# Patient Record
Sex: Male | Born: 1965 | Race: White | Hispanic: No | Marital: Married | Smoking: Never smoker
Health system: Southern US, Community
[De-identification: ages and names within clinical notes are randomized; demographics above are authoritative.]

## PROBLEM LIST (undated history)

## (undated) DIAGNOSIS — K5790 Diverticulosis of intestine, part unspecified, without perforation or abscess without bleeding: Secondary | ICD-10-CM

## (undated) DIAGNOSIS — K648 Other hemorrhoids: Secondary | ICD-10-CM

## (undated) DIAGNOSIS — I10 Essential (primary) hypertension: Secondary | ICD-10-CM

## (undated) DIAGNOSIS — Z98811 Dental restoration status: Secondary | ICD-10-CM

## (undated) DIAGNOSIS — E785 Hyperlipidemia, unspecified: Secondary | ICD-10-CM

## (undated) DIAGNOSIS — E119 Type 2 diabetes mellitus without complications: Secondary | ICD-10-CM

## (undated) DIAGNOSIS — G8929 Other chronic pain: Secondary | ICD-10-CM

## (undated) DIAGNOSIS — M722 Plantar fascial fibromatosis: Secondary | ICD-10-CM

## (undated) HISTORY — DX: Essential (primary) hypertension: I10

## (undated) HISTORY — DX: Other hemorrhoids: K64.8

## (undated) HISTORY — DX: Type 2 diabetes mellitus without complications: E11.9

## (undated) HISTORY — DX: Plantar fascial fibromatosis: M72.2

## (undated) HISTORY — PX: COLONOSCOPY: SHX174

## (undated) HISTORY — DX: Diverticulosis of intestine, part unspecified, without perforation or abscess without bleeding: K57.90

## (undated) HISTORY — DX: Hyperlipidemia, unspecified: E78.5

## (undated) HISTORY — PX: SHOULDER ARTHROSCOPY: SHX128

---

## 2001-07-19 ENCOUNTER — Encounter: Admission: RE | Admit: 2001-07-19 | Discharge: 2001-08-09 | Payer: Self-pay | Admitting: Specialist

## 2003-05-05 ENCOUNTER — Encounter: Admission: RE | Admit: 2003-05-05 | Discharge: 2003-05-05 | Payer: Self-pay | Admitting: General Surgery

## 2006-04-04 HISTORY — PX: OTHER SURGICAL HISTORY: SHX169

## 2007-01-09 ENCOUNTER — Ambulatory Visit (HOSPITAL_BASED_OUTPATIENT_CLINIC_OR_DEPARTMENT_OTHER): Admission: RE | Admit: 2007-01-09 | Discharge: 2007-01-09 | Payer: Self-pay | Admitting: Orthopedic Surgery

## 2007-04-05 HISTORY — PX: GANGLION CYST EXCISION: SHX1691

## 2007-10-26 ENCOUNTER — Encounter (INDEPENDENT_AMBULATORY_CARE_PROVIDER_SITE_OTHER): Payer: Self-pay | Admitting: Orthopedic Surgery

## 2007-10-26 ENCOUNTER — Ambulatory Visit (HOSPITAL_BASED_OUTPATIENT_CLINIC_OR_DEPARTMENT_OTHER): Admission: RE | Admit: 2007-10-26 | Discharge: 2007-10-26 | Payer: Self-pay | Admitting: Orthopedic Surgery

## 2008-01-22 ENCOUNTER — Ambulatory Visit: Payer: Self-pay | Admitting: Gastroenterology

## 2008-01-22 DIAGNOSIS — R195 Other fecal abnormalities: Secondary | ICD-10-CM

## 2008-01-22 DIAGNOSIS — K625 Hemorrhage of anus and rectum: Secondary | ICD-10-CM

## 2008-01-24 LAB — CONVERTED CEMR LAB
BUN: 13 mg/dL (ref 6–23)
Basophils Relative: 1.8 % (ref 0.0–3.0)
CO2: 32 meq/L (ref 19–32)
Calcium: 9.5 mg/dL (ref 8.4–10.5)
Chloride: 100 meq/L (ref 96–112)
Creatinine, Ser: 1 mg/dL (ref 0.4–1.5)
Eosinophils Absolute: 0.5 10*3/uL (ref 0.0–0.7)
Eosinophils Relative: 5.8 % — ABNORMAL HIGH (ref 0.0–5.0)
GFR calc Af Amer: 105 mL/min
GFR calc non Af Amer: 87 mL/min
Hemoglobin: 15.1 g/dL (ref 13.0–17.0)
Lymphocytes Relative: 37.2 % (ref 12.0–46.0)
MCV: 86.1 fL (ref 78.0–100.0)
Neutro Abs: 3.6 10*3/uL (ref 1.4–7.7)
Neutrophils Relative %: 44.1 % (ref 43.0–77.0)
RBC: 4.96 M/uL (ref 4.22–5.81)
Total Bilirubin: 0.8 mg/dL (ref 0.3–1.2)
WBC: 8.1 10*3/uL (ref 4.5–10.5)
aPTT: 32.6 s — ABNORMAL HIGH (ref 21.7–29.8)

## 2008-02-05 ENCOUNTER — Ambulatory Visit: Payer: Self-pay | Admitting: Gastroenterology

## 2008-04-04 HISTORY — PX: OTHER SURGICAL HISTORY: SHX169

## 2009-02-12 ENCOUNTER — Telehealth: Payer: Self-pay | Admitting: Gastroenterology

## 2009-02-20 ENCOUNTER — Ambulatory Visit (HOSPITAL_BASED_OUTPATIENT_CLINIC_OR_DEPARTMENT_OTHER): Admission: RE | Admit: 2009-02-20 | Discharge: 2009-02-21 | Payer: Self-pay | Admitting: Orthopedic Surgery

## 2010-05-12 ENCOUNTER — Other Ambulatory Visit: Payer: Self-pay | Admitting: Family Medicine

## 2010-05-14 ENCOUNTER — Other Ambulatory Visit: Payer: Self-pay

## 2010-05-19 ENCOUNTER — Other Ambulatory Visit: Payer: Self-pay

## 2010-05-27 ENCOUNTER — Ambulatory Visit
Admission: RE | Admit: 2010-05-27 | Discharge: 2010-05-27 | Disposition: A | Discharge status: Home / Self Care | Payer: 59 | Source: Ambulatory Visit | Attending: Family Medicine | Admitting: Family Medicine

## 2010-07-07 LAB — POCT HEMOGLOBIN-HEMACUE: Hemoglobin: 15 g/dL (ref 13.0–17.0)

## 2010-08-06 ENCOUNTER — Other Ambulatory Visit: Payer: Self-pay | Admitting: Dermatology

## 2010-08-17 NOTE — Op Note (Signed)
NAMECARTIER, WASHKO                  ACCOUNT NO.:  [id number]   MEDICAL RECORD NO.:  [id number]          PATIENT TYPE:  AMB   LOCATION:  DSC                          FACILITY:  [facility]   PHYSICIAN:  Katy Fitch. Sypher, M.D. DATE OF BIRTH:  Dec 11, 1965   DATE OF PROCEDURE:  01/09/2007  DATE OF DISCHARGE:                               OPERATIVE REPORT   PREOPERATIVE DIAGNOSIS:  MRI proven partial rupture of right distal  biceps insertion at radial tuberosity.   POSTOPERATIVE DIAGNOSIS:  MRI proven partial rupture of right distal  biceps insertion at radial tuberosity with identification of a 95%  distal biceps rupture with extensive pseudotendon formation at bicipital  tuberosity.   OPERATION:  1. Completion of biceps rupture.  2. Reconstruction of distal biceps insertion with an 8.5 mm drill hole      in the radial tuberosity anatomically placed with the aid of a C-      arm fluoroscope and securing of the biceps with an Arthrex      retrobutton system.   SURGEON:  Katy Fitch. Sypher, M.D.   ASSISTANT:  Marveen Reeks. Dasnoit, P.A.-C.   ANESTHESIA:  General by LMA.   SUPERVISING ANESTHESIOLOGIST:  Janetta Hora. Gelene Mink, M.D.   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  He has a history of radial tunnel syndrome corrected in  1999 by decompression of the posterior interosseous nerve at the arcade  of [place].  He subsequently returned for evaluation in the spring of  2008 with a several month history of positional severe anterior elbow  pain in the right dominant arm.  Mr. Langille is quite muscular and is very  physically active.  We advised a period of three months rest; however,  he was never able to achieve any satisfactory relief.  Therefore, an MRI  was obtained documenting a partial biceps insertion rupture with  evidence of pseudotendon and no obvious retraction of the tendon.  Due  to a  failure to respond to nonoperative measures, he is returned to the  operating room at this time.   Prior to proceeding with exploration of his antecubital fossa.  We, once  again, meticulously examined his arm in the holding area.  He was noted  to have marked pain with resistance to supination most notable with his  elbow at 90 degrees flexion, less so at 45 degrees of flexion, and  nearly absent in full extension.  He had significant pain with firm  palpation against the biceps tendon while flexing the elbow.  He had no  discomfort at the radial tunnel, no discomfort at the lateral or medial  epicondyles.  After informed consent, we proceed to surgery at this  time.   PROCEDURE:  Iram Lundberg is brought to the operating room and placed  in the supine position on the operating table.  Following an examination  by Dr. Gelene Mink for preoperative anesthesia consult, general anesthesia  by LMA technique was recommended.  He is brought to room  6 and placed in  the supine position on the operating table, and under Dr. Thornton Dales  direct supervision, general endotracheal anesthesia was induced.  A  pneumatic tourniquet was applied to the very proximal right brachium.  The arm was prepped with Betadine soap solution and sterilely draped.  Following exsanguination of the limb with an Esmarch bandage, the  arterial tourniquet was inflated to 250 mmHg due to his muscular arm.   The procedure commenced with a 4 cm transverse incision just distal to  the elbow flexion crease.  The subcutaneous tissues were carefully  divided taking care to meticulously dissect and identify the lateral  antebrachial cutaneous nerve.  This was gently retracted with blunt  retractors.  The antecubital fascia was carefully dissected and a  communicating branch between the cephalic and basilic veins was suture  ligated and transected.  The biceps was followed to its anatomic  insertion on the radial tuberosity.   There was the hyalinized  pseudotendon and a 95% avulsion of the biceps at its insertion.  Only  the most ulnar and posterior fibers remained.   After careful retraction of the adjacent soft tissue structures  including the median nerve and interosseous vessels, the tendon rupture  was completed with an osteotome and tenotomy scissors followed by  trimming of the tendon and preparation with placement of a [place] suture  of #2 FiberWire.  The retrobutton system was threaded in the usual  manner to essentially act as a block in tackle.  A beef needle was  placed through the radial tuberosity after checking for anatomic  position of the tuberosity and biceps insertion.  This was drilled  unicortically followed by over drilling with an 8.5 mm Arthrex reamer.  We subsequently penetrated the posterior cortex with the appropriate  size drill and, after irrigation and removal of all bone scrap, passed  the retrobutton with standard kite string technique.  With the aid of  the C-arm fluoroscope, the retrobutton was positioned on the dorsal  cortex of the radius followed by tensioning of the biceps tendon with  the elbow in flexion.  The tendon nested well within the intermaxillary  canal of the radius and the C-arm fluoroscope was used to confirm  satisfactory position of the retrobutton.  After looping the sutures  through the tendon one more time, the sutures were tied for a very  satisfactory fixation.   The wound was meticulously irrigated with sterile saline followed by  repair of the skin with subdermal sutures of 4-0 Vicryl and intradermal  3-0 Prolene with Steri-Strips.  Preoperatively, Mr. Yarbrough had an  infraclavicular block placed as a postoperative analgesic.  He was  placed in a compressive dressing with medial and lateral splints and was  awakened from general anesthesia and transferred to the recovery room  with stable vital signs.   He will be discharged home to the care of his  family.  We will see him  back for follow up in approximately four days to remove the dressing and  proceed to a lateral long arm splint maintaining his forearm in the  neutral position and his elbow at 90 degrees of flexion.  He will be  allowed to begin active range of motion exercises and elbow flexion  extension, but will not be allowed to pronate or supinator for six  weeks.  There were no apparent complications.      Katy Fitch Sypher, M.D.  Electronically Signed     RVS/MEDQ  D:  01/09/2007  T:  01/09/2007  Job:  [id number]

## 2010-08-17 NOTE — Op Note (Signed)
[name], [name]                  ACCOUNT NO.:  [id number]   MEDICAL RECORD NO.:  [id number]          PATIENT TYPE:  AMB   LOCATION:  DSC                          FACILITY:  [facility]   PHYSICIAN:  [name] Fitch. [name], M.D. 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   OPERATING SURGEON:  [name] Fitch. Sypher, MD   ASSISTANT:  Marveen Reeks Dasnoit, PA-C   ANESTHESIA:  General by LMA.   SUPERVISING ANESTHESIOLOGIST:  [name] Mayo, MD   INDICATIONS:  [name] [name] is a well-known patient who presented for  evaluation of a perplexing right wrist pain predicament.  [name] [name] is very  athletic and enjoys [employer]  He was back in the gym training  himself and noted pain with load bearing on his right hand with his  wrist in the dorsiflexed position such as doing a pushup or bench press.  Clinical examination suggested either a scapholunate interosseous  ligament injury or a possible subretinacular dorsal ganglion.   Due to his inability to improve with rest, he was referred for an MRI of  his right wrist on June 19, 2007.  This was accomplished at [facility] and interpreted by [name] [name], attending radiologist.   Dr. Suzie [name] identified a 5-mm circumscribed T2 hyperintense cyst between  the dorsal band of the scapholunate ligament and the dorsal wrist  capsule.  There was also a small hyperintense cyst on the volar margin  of the  distal radius.  These can be lymphatic cysts on the volar aspect  of the wrist and are rarely symptomatic.   [name] [name] was observed for 4 months and was unable to have relief of his  symptoms while weight training and load bearing on his wrist.  We  advised him to proceed with exploration of the dorsal aspect of the  wrist anticipating removal of a capsular ganglion and decompression of  the posterior interosseous nerve.   Preoperatively, we reminded [name] [name] and his family members that there  is no guarantee that a myxoid cyst or ganglion will not recur; however,  with proper debridement of the capsule in the dorsal aspect of the  wrist, recurrence rates are usually 5% or less.   For any 1 individual, the recurrence is either 100% or 0.   Preoperatively, questions were invited and answered in detail.  After  informed consent, he was brought to the operating room at this time.   PROCEDURE:  [name] [name] was brought to the operating room and placed  in supine position upon the operating table.   Following an anesthesia consult with Dr.  Fitzgerald, general anesthesia  by LMA was recommended and accepted by [name] [name].   He was brought to room #1, placed in supine position on the operating  table, and under Dr. Jarrett Ables direct supervision, general anesthesia  by LMA technique induced.   His right upper extremity was prepped with Betadine soap solution and  sterilely draped.  A pneumatic tourniquet was applied to the proximal  right brachium.   Following exsanguination of the right arm with Esmarch bandage, arterial  tourniquet was inflated to 220 mmHg.   Procedure commenced with identification of landmarks in the dorsal  aspect of the wrist.  Lister's tubercle, the proximal pole of the  scaphoid and dorsal tubercle, lunate were all identified.  A transverse  incision was planned directly over the scapholunate ligament.  Subcutaneous tissues were carefully divided taking care  to identify the  extensor retinaculum and retract the dorsal vascular structures.  The  retinaculum was split in the line of its fibers followed by gentle  retraction of the radial wrist extensors in a radial direction and the  finger extensions in an ulnar direction.  The capsule of the wrist joint  was palpated and a mass was identified directly over the scapholunate  ligament.  Taking care to preserve the radiocarpal ligaments, a  triangular portion of the dorsal capsule was resected directly over the  scapholunate ligament.  There was noted to be myxoid degeneration of the  capsule and a cyst that extended into the dorsal limb of the  scapholunate ligament.  The ligament had an yellowish discoloration and  some fibrillar degenerative change.  The ligament was carefully palpated  with a microspatula-type elevator in an effort to tease the fibers apart  and try to express any mucin within the ligament fibers.   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  There was moderate degree of synovitis noted dorsally  that was removed with a small rongeur.  The lunate was visualized and  found to have intact hyaline cartilage on its dorsal and central  articular surface.  The scaphoid facet of the radius and lunate facet of  the radius appeared normal.   With great care, the tendons of the fourth dorsal compartment were  retracted after release of the fourth dorsal compartment proximally over  1 cm.  The posterior interosseous nerve was identified, and a 1-cm  segment of the nerve was resected.   The electrocautery was used to control capillary bleeding on the margins  of the capsular section followed by anatomic reconstruction of the  retinaculum with figure-of-eight sutures of 4-0 Vicryl.  The skin was  repaired with subcutaneous sutures of 4-0 Vicryl and intradermal 3-0  Prolene.   There were no apparent complications.   Under direct  vision, the scapholunate ligament was tested.  There was  mild laxity but no frank instability.  I would grade the laxity of the  ligament at grade 1.   There were otherwise no pathologies noted.  The wound was dressed with a  Steri-Strip, sterile gauze, and a volar plaster splint.  Lidocaine 2%  was infiltrated for postop analgesia.  [name] [name] will be discharged with  a prescription for Percocet 5 mg 1 p.o. q.4-6 h. p.r.n. pain, 20 tablets  without refill.   We will see him back for followup in our office in 1 week or sooner  p.r.n. problems.      [name] [name], M.D.  Electronically  Signed     RVS/MEDQ  D:  10/26/2007  T:  10/27/2007  Job:  [id number]   cc:   Ernestina Penna, MD

## 2012-07-03 ENCOUNTER — Other Ambulatory Visit (INDEPENDENT_AMBULATORY_CARE_PROVIDER_SITE_OTHER): Payer: 59

## 2012-07-03 DIAGNOSIS — Z1212 Encounter for screening for malignant neoplasm of rectum: Secondary | ICD-10-CM

## 2012-08-10 ENCOUNTER — Telehealth: Payer: Self-pay | Admitting: Nurse Practitioner

## 2012-08-10 ENCOUNTER — Ambulatory Visit (INDEPENDENT_AMBULATORY_CARE_PROVIDER_SITE_OTHER): Payer: 59 | Admitting: General Practice

## 2012-08-10 ENCOUNTER — Encounter: Payer: Self-pay | Admitting: General Practice

## 2012-08-10 VITALS — BP 160/91 | HR 66 | Temp 97.3°F | Ht 70.0 in | Wt 202.0 lb

## 2012-08-10 DIAGNOSIS — L259 Unspecified contact dermatitis, unspecified cause: Secondary | ICD-10-CM

## 2012-08-10 DIAGNOSIS — L237 Allergic contact dermatitis due to plants, except food: Secondary | ICD-10-CM

## 2012-08-10 DIAGNOSIS — L255 Unspecified contact dermatitis due to plants, except food: Secondary | ICD-10-CM

## 2012-08-10 MED ORDER — PREDNISONE (PAK) 10 MG PO TABS: ORAL_TABLET | ORAL | Status: DC

## 2012-08-10 NOTE — Patient Instructions (Addendum)
Poison Ivy  Poison ivy is a inflammation of the skin (contact dermatitis) caused by touching the allergens on the leaves of the ivy plant following previous exposure to the plant. The rash usually appears 48 hours after exposure. The rash is usually bumps (papules) or blisters (vesicles) in a linear pattern. Depending on your own sensitivity, the rash may simply cause redness and itching, or it may also progress to blisters which may break open. These must be well cared for to prevent secondary bacterial (germ) infection, followed by scarring. Keep any open areas dry, clean, dressed, and covered with an antibacterial ointment if needed. The eyes may also get puffy. The puffiness is worst in the morning and gets better as the day progresses. This dermatitis usually heals without scarring, within 2 to 3 weeks without treatment.  HOME CARE INSTRUCTIONS   Thoroughly wash with soap and water as soon as you have been exposed to poison ivy. You have about one half hour to remove the plant resin before it will cause the rash. This washing will destroy the oil or antigen on the skin that is causing, or will cause, the rash. Be sure to wash under your fingernails as any plant resin there will continue to spread the rash. Do not rub skin vigorously when washing affected area. Poison ivy cannot spread if no oil from the plant remains on your body. A rash that has progressed to weeping sores will not spread the rash unless you have not washed thoroughly. It is also important to wash any clothes you have been wearing as these may carry active allergens. The rash will return if you wear the unwashed clothing, even several days later.  Avoidance of the plant in the future is the best measure. Poison ivy plant can be recognized by the number of leaves. Generally, poison ivy has three leaves with flowering branches on a single stem.  Diphenhydramine may be purchased over the counter and used as needed for itching. Do not drive with  this medication if it makes you drowsy.Ask your caregiver about medication for children.  SEEK MEDICAL CARE IF:   Open sores develop.   Redness spreads beyond area of rash.   You notice purulent (pus-like) discharge.   You have increased pain.   Other signs of infection develop (such as fever).  Document Released: 03/18/2000 Document Revised: 06/13/2011 Document Reviewed: 02/04/2009  [employer] Patient Information 2013 [employer].

## 2012-08-10 NOTE — Progress Notes (Addendum)
  Subjective:    Patient ID: [name] [name], male    DOB: 08-16-1965, 47 y.o.   MRN: [id number]  HPI Reports today with itchy, red on hands. Reports trimming hedges on Monday and noticed rash which has started to spread. Denies OTC medications being used.     Review of Systems  Constitutional: Negative for fever and chills.  Respiratory: Negative for chest tightness and shortness of breath.   Cardiovascular: Negative for chest pain and palpitations.  Skin: Positive for rash.       Red rash to bilateral hands.   Neurological: Negative for dizziness, weakness and headaches.       Objective:   Physical Exam  Constitutional: He is oriented to person, place, and time. He appears well-developed and well-nourished.  Cardiovascular: Normal rate, regular rhythm and normal heart sounds.   Pulmonary/Chest: Effort normal and breath sounds normal. No respiratory distress. He exhibits no tenderness.  Neurological: He is alert and oriented to person, place, and time.  Skin: Skin is warm and dry. Rash noted. There is erythema.  Erythematous,  Maculopapular rash noted to bilateral hands. With well demarcated lines. Negative for drainage.   Psychiatric: He has a normal mood and affect.          Assessment & Plan:  1. Contact dermatitis 2. Poison ivy dermatitis - predniSONE (STERAPRED UNI-PAK) 10 MG tablet; Take as directed  Dispense: 21 tablet; Refill: 0 -avoid irritants -keep skin clean and dry -RTO if symptoms worsen -Patient verbalized understanding -Coralie Keens, FNP-C

## 2012-08-10 NOTE — Telephone Encounter (Signed)
appt made for today 

## 2012-10-03 ENCOUNTER — Telehealth: Payer: Self-pay | Admitting: Family Medicine

## 2012-10-10 ENCOUNTER — Other Ambulatory Visit: Payer: Self-pay | Admitting: General Practice

## 2012-10-10 NOTE — Telephone Encounter (Signed)
Spoke with patient and he reports blood pressures to be 160's/90's and he is taking only 10mg  of lisinopril. Patient was seen on 08/10/12 and 20 mg script was sent to pharmacy and patient didn't pick up. Patient instructed to take 20mg  lisinopril, keep blood pressure diary and make appointment for 4 weeks for recheck of blood pressure and bring readings with him to visit. RTO sooner if symptoms develop.

## 2012-10-22 ENCOUNTER — Telehealth: Payer: Self-pay | Admitting: Family Medicine

## 2012-10-22 NOTE — Telephone Encounter (Signed)
Med called into [facility]

## 2012-10-31 ENCOUNTER — Encounter: Payer: Self-pay | Admitting: Physician Assistant

## 2012-10-31 ENCOUNTER — Ambulatory Visit (INDEPENDENT_AMBULATORY_CARE_PROVIDER_SITE_OTHER): Payer: 59 | Admitting: Physician Assistant

## 2012-10-31 VITALS — BP 170/99 | HR 67 | Temp 97.5°F | Ht 70.0 in | Wt 203.0 lb

## 2012-10-31 DIAGNOSIS — M25511 Pain in right shoulder: Secondary | ICD-10-CM

## 2012-10-31 DIAGNOSIS — M25519 Pain in unspecified shoulder: Secondary | ICD-10-CM

## 2012-10-31 MED ORDER — PREDNISONE (PAK) 10 MG PO TABS: 10.0000 mg | ORAL_TABLET | Freq: Every day | ORAL | Status: DC

## 2012-10-31 NOTE — Patient Instructions (Signed)
Knee Pain  The knee is the complex joint between your thigh and your lower leg. It is made up of bones, tendons, ligaments, and cartilage. The bones that make up the knee are:   The femur in the thigh.   The tibia and fibula in the lower leg.   The patella or kneecap riding in the groove on the lower femur.  CAUSES   Knee pain is a common complaint with many causes. A few of these causes are:   Injury, such as:   A ruptured ligament or tendon injury.   Torn cartilage.   Medical conditions, such as:   Gout   Arthritis   Infections   Overuse, over training or overdoing a physical activity.  Knee pain can be minor or severe. Knee pain can accompany debilitating injury. Minor knee problems often respond well to self-care measures or get well on their own. More serious injuries may need medical intervention or even surgery.  SYMPTOMS  The knee is complex. Symptoms of knee problems can vary widely. Some of the problems are:   Pain with movement and weight bearing.   Swelling and tenderness.   Buckling of the knee.   Inability to straighten or extend your knee.   Your knee locks and you cannot straighten it.   Warmth and redness with pain and fever.   Deformity or dislocation of the kneecap.  DIAGNOSIS   Determining what is wrong may be very straight forward such as when there is an injury. It can also be challenging because of the complexity of the knee. Tests to make a diagnosis may include:   Your caregiver taking a history and doing a physical exam.   Routine X-rays can be used to rule out other problems. X-rays will not reveal a cartilage tear. Some injuries of the knee can be diagnosed by:   Arthroscopy a surgical technique by which a small video camera is inserted through tiny incisions on the sides of the knee. This procedure is used to examine and repair internal knee joint problems. Tiny instruments can be used during arthroscopy to repair the torn knee cartilage (meniscus).   Arthrography  is a radiology technique. A contrast liquid is directly injected into the knee joint. Internal structures of the knee joint then become visible on X-ray film.   An MRI scan is a non x-ray radiology procedure in which magnetic fields and a computer produce two- or three-dimensional images of the inside of the knee. Cartilage tears are often visible using an MRI scanner. MRI scans have largely replaced arthrography in diagnosing cartilage tears of the knee.   Blood work.   Examination of the fluid that helps to lubricate the knee joint (synovial fluid). This is done by taking a sample out using a needle and a syringe.  TREATMENT  The treatment of knee problems depends on the cause. Some of these treatments are:   Depending on the injury, proper casting, splinting, surgery or physical therapy care will be needed.   Give yourself adequate recovery time. Do not overuse your joints. If you begin to get sore during workout routines, back off. Slow down or do fewer repetitions.   For repetitive activities such as cycling or running, maintain your strength and nutrition.   Alternate muscle groups. For example if you are a weight lifter, work the upper body on one day and the lower body the next.   Either tight or weak muscles do not give the proper support for your   knee. Tight or weak muscles do not absorb the stress placed on the knee joint. Keep the muscles surrounding the knee strong.   Take care of mechanical problems.   If you have flat feet, orthotics or special shoes may help. See your caregiver if you need help.   Arch supports, sometimes with wedges on the inner or outer aspect of the heel, can help. These can shift pressure away from the side of the knee most bothered by osteoarthritis.   A brace called an "unloader" brace also may be used to help ease the pressure on the most arthritic side of the knee.   If your caregiver has prescribed crutches, braces, wraps or ice, use as directed. The acronym for  this is PRICE. This means protection, rest, ice, compression and elevation.   Nonsteroidal anti-inflammatory drugs (NSAID's), can help relieve pain. But if taken immediately after an injury, they may actually increase swelling. Take NSAID's with food in your stomach. Stop them if you develop stomach problems. Do not take these if you have a history of ulcers, stomach pain or bleeding from the bowel. Do not take without your caregiver's approval if you have problems with fluid retention, heart failure, or kidney problems.   For ongoing knee problems, physical therapy may be helpful.   Glucosamine and chondroitin are over-the-counter dietary supplements. Both may help relieve the pain of osteoarthritis in the knee. These medicines are different from the usual anti-inflammatory drugs. Glucosamine may decrease the rate of cartilage destruction.   Injections of a corticosteroid drug into your knee joint may help reduce the symptoms of an arthritis flare-up. They may provide pain relief that lasts a few months. You may have to wait a few months between injections. The injections do have a small increased risk of infection, water retention and elevated blood sugar levels.   Hyaluronic acid injected into damaged joints may ease pain and provide lubrication. These injections may work by reducing inflammation. A series of shots may give relief for as long as 6 months.   Topical painkillers. Applying certain ointments to your skin may help relieve the pain and stiffness of osteoarthritis. Ask your pharmacist for suggestions. Many over the-counter products are approved for temporary relief of arthritis pain.   In some countries, doctors often prescribe topical NSAID's for relief of chronic conditions such as arthritis and tendinitis. A review of treatment with NSAID creams found that they worked as well as oral medications but without the serious side effects.  PREVENTION   Maintain a healthy weight. Extra pounds put  more strain on your joints.   Get strong, stay limber. Weak muscles are a common cause of knee injuries. Stretching is important. Include flexibility exercises in your workouts.   Be smart about exercise. If you have osteoarthritis, chronic knee pain or recurring injuries, you may need to change the way you exercise. This does not mean you have to stop being active. If your knees ache after jogging or playing basketball, consider switching to swimming, water aerobics or other low-impact activities, at least for a few days a week. Sometimes limiting high-impact activities will provide relief.   Make sure your shoes fit well. Choose footwear that is right for your sport.   Protect your knees. Use the proper gear for knee-sensitive activities. Use kneepads when playing volleyball or laying carpet. Buckle your seat belt every time you drive. Most shattered kneecaps occur in car accidents.   Rest when you are tired.  SEEK MEDICAL CARE IF:     You have knee pain that is continual and does not seem to be getting better.   SEEK IMMEDIATE MEDICAL CARE IF:   Your knee joint feels hot to the touch and you have a high fever.  MAKE SURE YOU:    Understand these instructions.   Will watch your condition.   Will get help right away if you are not doing well or get worse.  Document Released: 01/16/2007 Document Revised: 06/13/2011 Document Reviewed: 01/16/2007  [employer] Patient Information 2014 [employer].

## 2012-10-31 NOTE — Progress Notes (Signed)
Subjective:     Patient ID: [name] [name], male   DOB: 12-19-1965, 47 y.o.   MRN: [id number]  HPI Pt with mult joint pain He has had intermit R shoulder pain that has responded well to Pred Pack in the past Sx now for several weeks and has tried OTC NSAIDS at rx strength Pt also with bilat hip pain and L knee pain He has been doing more on an exercise bike and sx have gotten worse Pt unable to do treadmill due to chronic ankle issues    Review of Systems  All other systems reviewed and are negative.       Objective:   Physical Exam No palp spasm + TTP superior medial R scapula FROM of the shoulder Good strength Hips-FROM both hips, no sx with int/ext rotation, + sx bilat with frog leg Good strength bilat lower ext L knee-No effusion/edema, FROM w/o crepitus, + TTP medial femoral cond.    Assessment:     Shoulder pain L knee pain Bilat hip pain    Plan:     Rx for Sterapred dose pack.  Informed this would help all sx Check set up on the bike Heat/Ice Stretching Cont Neoprene brace F/U prn

## 2012-11-29 ENCOUNTER — Encounter (INDEPENDENT_AMBULATORY_CARE_PROVIDER_SITE_OTHER): Payer: Self-pay | Admitting: Surgery

## 2012-11-29 ENCOUNTER — Ambulatory Visit (INDEPENDENT_AMBULATORY_CARE_PROVIDER_SITE_OTHER): Payer: 59 | Admitting: Surgery

## 2012-11-29 VITALS — BP 180/100 | HR 60 | Temp 98.6°F | Resp 16 | Ht 70.0 in | Wt 204.0 lb

## 2012-11-29 DIAGNOSIS — G8929 Other chronic pain: Secondary | ICD-10-CM

## 2012-11-29 DIAGNOSIS — R1013 Epigastric pain: Secondary | ICD-10-CM

## 2012-11-29 NOTE — Progress Notes (Signed)
Patient ID: [name] [name], male   DOB: 01/28/1966, 47 y.o.   MRN: [id number]  Chief Complaint  Patient presents with  . New Evaluation    eval abd hernia    HPI [name] [name] is a 47 y.o. male.  Patient presents to to epigastric abdominal pain. 3 months ago patient noticed pain in his epigastrium after physical exercise. He is doing multiple sets of abdominal strengthening exercises and begin to develop pain in his upper abdomen. Pain improved with rest but recurred with activity. He noticed some swelling in his upper abdomen as well. No nausea vomiting. No radiation. No fever chills. The pain is a 5/ 10 and crampy in nature. It is intermittent. HPI  Past Medical History  Diagnosis Date  . Hypertension     Past Surgical History  Procedure Laterality Date  . Right shoulder surgery    . Right bicep surgery      Radial nerve and cyst removal  . Ganglion cyst excision      Family History  Problem Relation Age of Onset  . Heart disease Mother   . Diabetes Mother   . Cancer Father     kidney    Social History History  Substance Use Topics  . Smoking status: Never Smoker   . Smokeless tobacco: Never Used  . Alcohol Use: No    No Known Allergies  Current Outpatient Prescriptions  Medication Sig Dispense Refill  . ibuprofen (ADVIL,MOTRIN) 600 MG tablet Take 600 mg by mouth every 6 (six) hours as needed for pain.      Marland Kitchen lisinopril (PRINIVIL,ZESTRIL) 20 MG tablet Take 20 mg by mouth daily.       No current facility-administered medications for this visit.    Review of Systems Review of Systems  HENT: Negative.   Cardiovascular: Negative.   Gastrointestinal: Positive for abdominal pain.  Musculoskeletal: Negative.   Hematological: Negative.   Psychiatric/Behavioral: Negative.     Blood pressure 180/100, pulse 60, temperature 98.6 F (37 C), temperature source Oral, resp. rate 16, height 5\' 10"  (1.778 m), weight 204 lb (92.534 kg).  Physical Exam Physical Exam   Constitutional: He is oriented to person, place, and time. He appears well-developed and well-nourished.  HENT:  Head: Normocephalic and atraumatic.  Neck: Normal range of motion. Neck supple.  Abdominal: Soft. He exhibits no distension. There is no tenderness. No hernia. Hernia confirmed negative in the ventral area.  Musculoskeletal: Normal range of motion.  Neurological: He is alert and oriented to person, place, and time.  Skin: Skin is warm and dry.    Data Reviewed none  Assessment    Abdominal pain  epigastrium    Plan    Check CT scan to further evaluate.pain noticeable after exercise. Concern for hernia. Physical examination otherwise normal but the problem is persisting for 3 months and not getting better with rest. A CT normal, may benefit from physical therapy. Return to clinic as needed.       Leiana Rund A. 11/29/2012, 4:24 PM

## 2012-11-29 NOTE — Patient Instructions (Signed)
Check CT scan. If normal,  Refer to PT for evaluation.

## 2012-11-30 ENCOUNTER — Telehealth (INDEPENDENT_AMBULATORY_CARE_PROVIDER_SITE_OTHER): Payer: Self-pay | Admitting: General Surgery

## 2012-11-30 NOTE — Telephone Encounter (Signed)
LMOM letting pt know that he is scheduled for a CT scan at [facility] located at [address] on 12/06/12 at 10:30.  Explained that he cannot have any solid foods 4 hours before the test mult liquids and medications are okay.  Also explained that he will drink the first bottle of contrast at 8:30 and the second bottle at 9:30.

## 2012-12-06 ENCOUNTER — Ambulatory Visit
Admission: RE | Admit: 2012-12-06 | Discharge: 2012-12-06 | Disposition: A | Discharge status: Home / Self Care | Payer: 59 | Source: Ambulatory Visit | Attending: Surgery | Admitting: Surgery

## 2012-12-06 DIAGNOSIS — G8929 Other chronic pain: Secondary | ICD-10-CM

## 2012-12-06 MED ORDER — IOHEXOL 300 MG/ML  SOLN
125.0000 mL | Freq: Once | INTRAMUSCULAR | Status: AC | PRN
  Administered 2012-12-06: 125 mL via INTRAVENOUS

## 2012-12-07 ENCOUNTER — Ambulatory Visit (INDEPENDENT_AMBULATORY_CARE_PROVIDER_SITE_OTHER): Payer: 59 | Admitting: General Practice

## 2012-12-07 ENCOUNTER — Telehealth (INDEPENDENT_AMBULATORY_CARE_PROVIDER_SITE_OTHER): Payer: Self-pay

## 2012-12-07 ENCOUNTER — Encounter: Payer: Self-pay | Admitting: General Practice

## 2012-12-07 VITALS — BP 162/97 | HR 69 | Temp 98.0°F | Ht 70.0 in | Wt 203.5 lb

## 2012-12-07 DIAGNOSIS — Z87440 Personal history of urinary (tract) infections: Secondary | ICD-10-CM

## 2012-12-07 DIAGNOSIS — M5432 Sciatica, left side: Secondary | ICD-10-CM

## 2012-12-07 DIAGNOSIS — I10 Essential (primary) hypertension: Secondary | ICD-10-CM

## 2012-12-07 DIAGNOSIS — M5430 Sciatica, unspecified side: Secondary | ICD-10-CM

## 2012-12-07 MED ORDER — PREDNISONE (PAK) 10 MG PO TABS: ORAL_TABLET | ORAL | Status: DC

## 2012-12-07 MED ORDER — LISINOPRIL-HYDROCHLOROTHIAZIDE 20-25 MG PO TABS: 1.0000 | ORAL_TABLET | Freq: Every day | ORAL | Status: DC

## 2012-12-07 MED ORDER — METHYLPREDNISOLONE ACETATE 80 MG/ML IJ SUSP
80.0000 mg | Freq: Once | INTRAMUSCULAR | Status: AC
  Administered 2012-12-07: 80 mg via INTRAMUSCULAR

## 2012-12-07 NOTE — Patient Instructions (Addendum)
Sciatica °Sciatica is pain, weakness, numbness, or tingling along the path of the sciatic nerve. The nerve starts in the lower back and runs down the back of each leg. The nerve controls the muscles in the lower leg and in the back of the knee, while also providing sensation to the back of the thigh, lower leg, and the sole of your foot. Sciatica is a symptom of another medical condition. For instance, nerve damage or certain conditions, such as a herniated disk or bone spur on the spine, pinch or put pressure on the sciatic nerve. This causes the pain, weakness, or other sensations normally associated with sciatica. Generally, sciatica only affects one side of the body. °CAUSES  °· Herniated or slipped disc. °· Degenerative disk disease. °· A pain disorder involving the narrow muscle in the buttocks (piriformis syndrome). °· Pelvic injury or fracture. °· Pregnancy. °· Tumor (rare). °SYMPTOMS  °Symptoms can vary from mild to very severe. The symptoms usually travel from the low back to the buttocks and down the back of the leg. Symptoms can include: °· Mild tingling or dull aches in the lower back, leg, or hip. °· Numbness in the back of the calf or sole of the foot. °· Burning sensations in the lower back, leg, or hip. °· Sharp pains in the lower back, leg, or hip. °· Leg weakness. °· Severe back pain inhibiting movement. °These symptoms may get worse with coughing, sneezing, laughing, or prolonged sitting or standing. Also, being overweight may worsen symptoms. °DIAGNOSIS  °Your caregiver will perform a physical exam to look for common symptoms of sciatica. He or she may ask you to do certain movements or activities that would trigger sciatic nerve pain. Other tests may be performed to find the cause of the sciatica. These may include: °· Blood tests. °· X-rays. °· Imaging tests, such as an MRI or CT scan. °TREATMENT  °Treatment is directed at the cause of the sciatic pain. Sometimes, treatment is not necessary  and the pain and discomfort goes away on its own. If treatment is needed, your caregiver may suggest: °· Over-the-counter medicines to relieve pain. °· Prescription medicines, such as anti-inflammatory medicine, muscle relaxants, or narcotics. °· Applying heat or ice to the painful area. °· Steroid injections to lessen pain, irritation, and inflammation around the nerve. °· Reducing activity during periods of pain. °· Exercising and stretching to strengthen your abdomen and improve flexibility of your spine. Your caregiver may suggest losing weight if the extra weight makes the back pain worse. °· Physical therapy. °· Surgery to eliminate what is pressing or pinching the nerve, such as a bone spur or part of a herniated disk. °HOME CARE INSTRUCTIONS  °· Only take over-the-counter or prescription medicines for pain or discomfort as directed by your caregiver. °· Apply ice to the affected area for 20 minutes, 3 4 times a day for the first 48 72 hours. Then try heat in the same way. °· Exercise, stretch, or perform your usual activities if these do not aggravate your pain. °· Attend physical therapy sessions as directed by your caregiver. °· Keep all follow-up appointments as directed by your caregiver. °· Do not wear high heels or shoes that do not provide proper support. °· Check your mattress to see if it is too soft. A firm mattress may lessen your pain and discomfort. °SEEK IMMEDIATE MEDICAL CARE IF:  °· You lose control of your bowel or bladder (incontinence). °· You have increasing weakness in the lower back,   pelvis, buttocks, or legs. °· You have redness or swelling of your back. °· You have a burning sensation when you urinate. °· You have pain that gets worse when you lie down or awakens you at night. °· Your pain is worse than you have experienced in the past. °· Your pain is lasting longer than 4 weeks. °· You are suddenly losing weight without reason. °MAKE SURE YOU: °· Understand these  instructions. °· Will watch your condition. °· Will get help right away if you are not doing well or get worse. °Document Released: 03/15/2001 Document Revised: 09/20/2011 Document Reviewed: 07/31/2011 °[employer]® Patient Information ©2014 [employer]. ° °

## 2012-12-07 NOTE — Telephone Encounter (Signed)
Message copied by Brennan Bailey on Fri Dec 07, 2012  2:59 PM ------      Message from: Harriette Bouillon A      Created: Thu Dec 06, 2012 12:40 PM       Constipated.  miralx  17 grams daily ------

## 2012-12-07 NOTE — Progress Notes (Signed)
  Subjective:    Patient ID: [name] [name], male    DOB: 07/21/1965, 47 y.o.   MRN: [id number]  Back Pain This is a new problem. The current episode started in the past 7 days. The problem occurs constantly. The problem is unchanged. The pain is present in the lumbar spine and gluteal. The quality of the pain is described as aching. Radiates to: reports a tingling feeling in right groin area at times. The pain is at a severity of 6/10. The pain is worse during the day. The symptoms are aggravated by bending, position, standing, sitting and twisting. Stiffness is present in the morning. Pertinent negatives include no bladder incontinence, bowel incontinence, chest pain, dysuria, fever, headaches, numbness, pelvic pain or weakness. He has tried NSAIDs for the symptoms. The treatment provided no relief.  Reports having a history of UTIs, denies burning or discomfort.      Review of Systems  Constitutional: Negative for fever and chills.  Respiratory: Negative for chest tightness and shortness of breath.   Cardiovascular: Negative for chest pain and palpitations.  Gastrointestinal: Negative for bowel incontinence.  Genitourinary: Negative for bladder incontinence, dysuria, urgency, frequency, hematuria, scrotal swelling, difficulty urinating, testicular pain and pelvic pain.  Musculoskeletal: Positive for back pain.  Neurological: Negative for dizziness, weakness, numbness and headaches.       Objective:   Physical Exam  Constitutional: He is oriented to person, place, and time. He appears well-developed and well-nourished.  Neck: Normal range of motion. Neck supple. No thyromegaly present.  Cardiovascular: Normal rate, regular rhythm and normal heart sounds.   Pulmonary/Chest: Effort normal and breath sounds normal. No respiratory distress. He exhibits no tenderness.  Abdominal: Soft. Bowel sounds are normal. He exhibits no distension. There is no tenderness.  Musculoskeletal: Normal range  of motion. He exhibits no edema and no tenderness.  Demonstrates discomfort with position change  Lymphadenopathy:    He has no cervical adenopathy.  Neurological: He is alert and oriented to person, place, and time.  Skin: Skin is warm and dry.  Psychiatric: He has a normal mood and affect.          Assessment & Plan:  1. Sciatica of left side - methylPREDNISolone acetate (DEPO-MEDROL) injection 80 mg; Inject 1 mL (80 mg total) into the muscle once. - predniSONE (STERAPRED UNI-PAK) 10 MG tablet; Take as directed, start tomorrow 12/08/12  Dispense: 21 tablet; Refill: 0 -will refer to ortho if worsening or no improvement  2. History of UTI - POCT UA - Microscopic Only - POCT urinalysis dipstick  3. Hypertension - lisinopril-hydrochlorothiazide (PRINZIDE,ZESTORETIC) 20-25 MG per tablet; Take 1 tablet by mouth daily.  Dispense: 30 tablet; Refill: 0 -RTO in 3 days for follow up, soon if symptoms worsen and in 1 month for recheck of blood pressure -maintain blood pressure diary and bring to next visit -Patient verbalized understanding -Coralie Keens, FNP-C

## 2012-12-07 NOTE — Telephone Encounter (Signed)
LMOM> CT negative for hernia but shows constipation. Miralax 17 grams daily.

## 2012-12-10 ENCOUNTER — Other Ambulatory Visit (INDEPENDENT_AMBULATORY_CARE_PROVIDER_SITE_OTHER): Payer: 59

## 2012-12-10 DIAGNOSIS — M549 Dorsalgia, unspecified: Secondary | ICD-10-CM

## 2012-12-10 LAB — POCT URINALYSIS DIPSTICK
Glucose, UA: NEGATIVE
Protein, UA: NEGATIVE
Spec Grav, UA: 1.01
Urobilinogen, UA: NEGATIVE
pH, UA: 6.5

## 2012-12-10 LAB — POCT UA - MICROSCOPIC ONLY
Casts, Ur, LPF, POC: NEGATIVE
Crystals, Ur, HPF, POC: NEGATIVE

## 2012-12-10 NOTE — Telephone Encounter (Signed)
Pt returned call and was given msg from [place]. Pt states he understands.

## 2012-12-11 ENCOUNTER — Telehealth: Payer: Self-pay | Admitting: General Practice

## 2012-12-12 NOTE — Telephone Encounter (Signed)
Lm , urine results ok.

## 2013-01-05 ENCOUNTER — Other Ambulatory Visit: Payer: Self-pay | Admitting: Family Medicine

## 2013-01-07 ENCOUNTER — Ambulatory Visit: Payer: 59 | Admitting: General Practice

## 2013-01-11 ENCOUNTER — Encounter (HOSPITAL_BASED_OUTPATIENT_CLINIC_OR_DEPARTMENT_OTHER): Payer: Self-pay | Admitting: *Deleted

## 2013-01-15 ENCOUNTER — Other Ambulatory Visit: Payer: Self-pay | Admitting: Orthopedic Surgery

## 2013-01-15 ENCOUNTER — Encounter (HOSPITAL_BASED_OUTPATIENT_CLINIC_OR_DEPARTMENT_OTHER): Payer: Self-pay | Admitting: *Deleted

## 2013-01-15 NOTE — Progress Notes (Signed)
Off bp meds-no labs do

## 2013-01-16 NOTE — H&P (Signed)
  [name] [name] is an 47 y.o. male.   Chief Complaint: c/o volar ganglion cyst left wrist. HPI:   He has noted a soft mass of 1 cm. in diameter adjacent to the scaphotrapezial trapezoid joint.  This is consistent with a volar ganglion cyst.  [name] [name] is 47 years of age and is right-hand dominant.  He is a [name] [name] employed by [employer] in [employer]  He enjoys recreational [employer] The cyst has been present six weeks. He has no numbness.  He has no history of antecedent injury.    Past Medical History  Diagnosis Date  . Chronic pain   . Hypertension     no meds  . Dental crown present     Past Surgical History  Procedure Laterality Date  . Right shoulder surgery  2010  . Right bicep surgery  2008    Radial nerve and cyst removal-bicep rupture  . Ganglion cyst excision  2009    rt wrist  . Shoulder arthroscopy  98,09    right  . Colonoscopy      Family History  Problem Relation Age of Onset  . Heart disease Mother   . Diabetes Mother   . Cancer Father     kidney   Social History:  reports that he has never smoked. He has never used smokeless tobacco. He reports that he does not drink alcohol or use illicit drugs.  Allergies: Not on File  No prescriptions prior to admission    No results found for this or any previous visit (from the past 48 hour(s)).  No results found.   Pertinent items are noted in HPI.  Height 5\' 10"  (1.778 m), weight 88.451 kg (195 lb).  General appearance: alert Head: Normocephalic/atraumatic Neck: supple, symmetrical, trachea midline Resp: clear to auscultation bilaterally Cardio: regular rate and rhythm GI: normal findings: bowel sounds normal Extremities:  Inspection of his left wrist reveals a typical volar ganglion adjacent to the STT joint.  This is soft and nontender. He has full range of motion of his wrist and fingers.  His neurovascular examination is intact.    Plain films of his wrist demonstrate normal bony  anatomy.    Pulses: 2+ and symmetric Skin: normal Neurologic: Grossly normal    Assessment/Plan Impression: Left wrist volar ganglion cyst.  We have discussed the natural history of these myxoid, degenerative cysts.  He understands that no guarantee can be provided that other cysts will not form.  The biology of these cysts is poorly understood.  The relationship to the radial artery and branches is outlined which places these structures at risk.  Plan: To the OR for excision of volar ganglion cyst left wrist.The procedure, risks,benefits and post-op course were discussed with the patient at length and they were in agreement with the plan.  DASNOIT,Estuardo Frisbee J 01/16/2013, 4:15 PM H&P documentation: 01/17/2013  -History and Physical Reviewed  -Patient has been re-examined  -No change in the plan of care  Wyn Forster, MD

## 2013-01-17 ENCOUNTER — Encounter (HOSPITAL_BASED_OUTPATIENT_CLINIC_OR_DEPARTMENT_OTHER)
Admission: RE | Disposition: A | Discharge status: Home / Self Care | Payer: Self-pay | Source: Ambulatory Visit | Attending: Orthopedic Surgery

## 2013-01-17 ENCOUNTER — Encounter (HOSPITAL_BASED_OUTPATIENT_CLINIC_OR_DEPARTMENT_OTHER): Payer: Self-pay | Admitting: Orthopedic Surgery

## 2013-01-17 ENCOUNTER — Ambulatory Visit (HOSPITAL_BASED_OUTPATIENT_CLINIC_OR_DEPARTMENT_OTHER): Payer: 59 | Admitting: Certified Registered Nurse Anesthetist

## 2013-01-17 ENCOUNTER — Ambulatory Visit (HOSPITAL_BASED_OUTPATIENT_CLINIC_OR_DEPARTMENT_OTHER)
Admission: RE | Admit: 2013-01-17 | Discharge: 2013-01-17 | Disposition: A | Discharge status: Home / Self Care | Payer: 59 | Source: Ambulatory Visit | Attending: Orthopedic Surgery | Admitting: Orthopedic Surgery

## 2013-01-17 ENCOUNTER — Encounter (HOSPITAL_BASED_OUTPATIENT_CLINIC_OR_DEPARTMENT_OTHER): Payer: 59 | Admitting: Certified Registered Nurse Anesthetist

## 2013-01-17 DIAGNOSIS — I10 Essential (primary) hypertension: Secondary | ICD-10-CM | POA: Insufficient documentation

## 2013-01-17 DIAGNOSIS — G8929 Other chronic pain: Secondary | ICD-10-CM | POA: Insufficient documentation

## 2013-01-17 DIAGNOSIS — M6740 Ganglion, unspecified site: Secondary | ICD-10-CM | POA: Insufficient documentation

## 2013-01-17 HISTORY — PX: GANGLION CYST EXCISION: SHX1691

## 2013-01-17 HISTORY — DX: Other chronic pain: G89.29

## 2013-01-17 HISTORY — DX: Dental restoration status: Z98.811

## 2013-01-17 LAB — POCT HEMOGLOBIN-HEMACUE: Hemoglobin: 14.6 g/dL (ref 13.0–17.0)

## 2013-01-17 SURGERY — EXCISION, GANGLION CYST, WRIST
Anesthesia: General | Site: Wrist | Laterality: Left | Wound class: Clean

## 2013-01-17 MED ORDER — DEXAMETHASONE SODIUM PHOSPHATE 4 MG/ML IJ SOLN
INTRAMUSCULAR | Status: DC | PRN
  Administered 2013-01-17: 10 mg via INTRAVENOUS

## 2013-01-17 MED ORDER — MIDAZOLAM HCL 5 MG/5ML IJ SOLN
INTRAMUSCULAR | Status: DC | PRN
  Administered 2013-01-17: 2 mg via INTRAVENOUS

## 2013-01-17 MED ORDER — FENTANYL CITRATE 0.05 MG/ML IJ SOLN
INTRAMUSCULAR | Status: AC
  Filled 2013-01-17: qty 2

## 2013-01-17 MED ORDER — HYDROMORPHONE HCL PF 1 MG/ML IJ SOLN
0.2500 mg | INTRAMUSCULAR | Status: DC | PRN
  Administered 2013-01-17 (×2): 0.5 mg via INTRAVENOUS

## 2013-01-17 MED ORDER — PROPOFOL 10 MG/ML IV BOLUS
INTRAVENOUS | Status: DC | PRN
  Administered 2013-01-17: 180 mg via INTRAVENOUS

## 2013-01-17 MED ORDER — METHYLPREDNISOLONE ACETATE 40 MG/ML IJ SUSP
INTRAMUSCULAR | Status: AC
  Filled 2013-01-17: qty 1

## 2013-01-17 MED ORDER — MIDAZOLAM HCL 2 MG/2ML IJ SOLN
INTRAMUSCULAR | Status: AC
  Filled 2013-01-17: qty 2

## 2013-01-17 MED ORDER — LIDOCAINE HCL 2 % IJ SOLN
INTRAMUSCULAR | Status: DC | PRN
  Administered 2013-01-17: 1.5 mL

## 2013-01-17 MED ORDER — LIDOCAINE HCL 2 % IJ SOLN
INTRAMUSCULAR | Status: AC
  Filled 2013-01-17: qty 20

## 2013-01-17 MED ORDER — OXYCODONE-ACETAMINOPHEN 5-325 MG PO TABS: ORAL_TABLET | ORAL | Status: DC

## 2013-01-17 MED ORDER — OXYCODONE HCL 5 MG PO TABS: 5.0000 mg | ORAL_TABLET | Freq: Once | ORAL | Status: DC | PRN

## 2013-01-17 MED ORDER — METOCLOPRAMIDE HCL 5 MG/ML IJ SOLN: 10.0000 mg | Freq: Once | INTRAMUSCULAR | Status: DC | PRN

## 2013-01-17 MED ORDER — ONDANSETRON HCL 4 MG/2ML IJ SOLN
INTRAMUSCULAR | Status: DC | PRN
  Administered 2013-01-17: 4 mg via INTRAMUSCULAR

## 2013-01-17 MED ORDER — CHLORHEXIDINE GLUCONATE 4 % EX LIQD: 60.0000 mL | Freq: Once | CUTANEOUS | Status: DC

## 2013-01-17 MED ORDER — HYDROMORPHONE HCL PF 1 MG/ML IJ SOLN
INTRAMUSCULAR | Status: AC
  Filled 2013-01-17: qty 1

## 2013-01-17 MED ORDER — OXYCODONE HCL 5 MG/5ML PO SOLN: 5.0000 mg | Freq: Once | ORAL | Status: DC | PRN

## 2013-01-17 MED ORDER — MIDAZOLAM HCL 2 MG/2ML IJ SOLN: 1.0000 mg | INTRAMUSCULAR | Status: DC | PRN

## 2013-01-17 MED ORDER — FENTANYL CITRATE 0.05 MG/ML IJ SOLN
INTRAMUSCULAR | Status: DC | PRN
  Administered 2013-01-17 (×2): 50 ug via INTRAVENOUS

## 2013-01-17 MED ORDER — LIDOCAINE HCL (CARDIAC) 20 MG/ML IV SOLN
INTRAVENOUS | Status: DC | PRN
  Administered 2013-01-17: 80 mg via INTRAVENOUS

## 2013-01-17 MED ORDER — 0.9 % SODIUM CHLORIDE (POUR BTL) OPTIME
TOPICAL | Status: DC | PRN
  Administered 2013-01-17: 50 mL

## 2013-01-17 MED ORDER — LACTATED RINGERS IV SOLN
INTRAVENOUS | Status: DC
  Administered 2013-01-17 (×3): via INTRAVENOUS

## 2013-01-17 MED ORDER — FENTANYL CITRATE 0.05 MG/ML IJ SOLN: 50.0000 ug | INTRAMUSCULAR | Status: DC | PRN

## 2013-01-17 SURGICAL SUPPLY — 44 items
BANDAGE ADHESIVE 1X3 (GAUZE/BANDAGES/DRESSINGS) IMPLANT
BANDAGE ELASTIC 3 VELCRO ST LF (GAUZE/BANDAGES/DRESSINGS) IMPLANT
BANDAGE GAUZE ELAST BULKY 4 IN (GAUZE/BANDAGES/DRESSINGS) IMPLANT
BLADE MINI RND TIP GREEN BEAV (BLADE) IMPLANT
BLADE SURG 15 STRL LF DISP TIS (BLADE) ×1 IMPLANT
BLADE SURG 15 STRL SS (BLADE) ×1
BNDG COHESIVE 3X5 TAN STRL LF (GAUZE/BANDAGES/DRESSINGS) ×2 IMPLANT
BNDG ESMARK 4X9 LF (GAUZE/BANDAGES/DRESSINGS) ×2 IMPLANT
BRUSH SCRUB EZ PLAIN DRY (MISCELLANEOUS) ×2 IMPLANT
CLOTH BEACON ORANGE TIMEOUT ST (SAFETY) ×2 IMPLANT
CORDS BIPOLAR (ELECTRODE) ×2 IMPLANT
COVER MAYO STAND STRL (DRAPES) ×2 IMPLANT
COVER TABLE BACK 60X90 (DRAPES) ×2 IMPLANT
CUFF TOURNIQUET SINGLE 18IN (TOURNIQUET CUFF) ×2 IMPLANT
DECANTER SPIKE VIAL GLASS SM (MISCELLANEOUS) ×2 IMPLANT
DRAPE EXTREMITY T 121X128X90 (DRAPE) ×2 IMPLANT
DRAPE SURG 17X23 STRL (DRAPES) ×2 IMPLANT
GLOVE BIOGEL M STRL SZ7.5 (GLOVE) IMPLANT
GLOVE BIOGEL PI IND STRL 7.0 (GLOVE) ×1 IMPLANT
GLOVE BIOGEL PI INDICATOR 7.0 (GLOVE) ×1
GLOVE ECLIPSE 6.5 STRL STRAW (GLOVE) ×2 IMPLANT
GLOVE EXAM NITRILE MD LF STRL (GLOVE) ×2 IMPLANT
GLOVE ORTHO TXT STRL SZ7.5 (GLOVE) ×2 IMPLANT
GOWN BRE IMP PREV XXLGXLNG (GOWN DISPOSABLE) ×2 IMPLANT
GOWN PREVENTION PLUS XLARGE (GOWN DISPOSABLE) ×2 IMPLANT
NEEDLE 27GAX1X1/2 (NEEDLE) ×2 IMPLANT
PACK BASIN DAY SURGERY FS (CUSTOM PROCEDURE TRAY) ×2 IMPLANT
PAD CAST 3X4 CTTN HI CHSV (CAST SUPPLIES) ×1 IMPLANT
PADDING CAST ABS 4INX4YD NS (CAST SUPPLIES)
PADDING CAST ABS COTTON 4X4 ST (CAST SUPPLIES) IMPLANT
PADDING CAST COTTON 3X4 STRL (CAST SUPPLIES) ×1
SPLINT PLASTER CAST XFAST 3X15 (CAST SUPPLIES) ×5 IMPLANT
SPLINT PLASTER XTRA FASTSET 3X (CAST SUPPLIES) ×5
SPONGE GAUZE 4X4 12PLY (GAUZE/BANDAGES/DRESSINGS) ×2 IMPLANT
STOCKINETTE 4X48 STRL (DRAPES) ×2 IMPLANT
STRIP CLOSURE SKIN 1/2X4 (GAUZE/BANDAGES/DRESSINGS) ×2 IMPLANT
SUT PROLENE 3 0 PS 2 (SUTURE) ×2 IMPLANT
SUT VIC AB 4-0 P-3 18XBRD (SUTURE) IMPLANT
SUT VIC AB 4-0 P3 18 (SUTURE)
SYR 3ML 23GX1 SAFETY (SYRINGE) IMPLANT
SYR CONTROL 10ML LL (SYRINGE) ×2 IMPLANT
TOWEL OR 17X24 6PK STRL BLUE (TOWEL DISPOSABLE) ×4 IMPLANT
TRAY DSU PREP LF (CUSTOM PROCEDURE TRAY) ×2 IMPLANT
UNDERPAD 30X30 INCONTINENT (UNDERPADS AND DIAPERS) ×2 IMPLANT

## 2013-01-17 NOTE — Op Note (Deleted)
NAMEDEMAR, SHAD                  ACCOUNT NO.:  [id number]  MEDICAL RECORD NO.:  [id number]  LOCATION:                               FACILITY:  [facility]  PHYSICIAN:  Katy Fitch. Hajira Verhagen, M.D. 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  Identification of origin of cyst from capsule of the wrist joint, overlying scaphoid, and radial scaphocapitate ligament.  OPERATION:  Debridement of myxoid cyst from volar capsular ligaments, left wrist.  OPERATING SURGEON:  Katy Fitch. Brynlynn Walko, M.D.  ASSISTANT:  Surgical technician.  ANESTHESIA:  General by LMA.  SUPERVISING ANESTHESIOLOGIST:  Janetta Hora. Gelene Mink, M.D.  INDICATIONS:  [name] [name] is a 47 year old gentleman employed by [employer] who is well acquainted with our practice.  He presented for evaluation of a left volar myxoid cyst presenting over the radioscaphoid capitate ligament at the distal left wrist just proximal to the distal wrist flexion crease.  This was mobile, but was uncomfortable and bothersome to him.  We had a detailed informed consent with Mr. Sponaugle explaining how the cyst typically tracked the branches of the radial artery.  They can involve the wall of the radial artery either dorsal branch or superficial branch and typically originate on the volar wrist capsule.  We advised him to observe this over a period of time.  We told him we could debride it, though he would be faced with a significant risk of recurrence due to the natural history of these volar  cyst.  After informed consent, he chose to return at this time for cyst excision.  In the holding area, his left hand and arm were marked as the proper surgical site per the identification protocol with a marking pen, and questions were invited and answered in detail.  He understands that we cannot guarantee that he will not develop another cyst.  PROCEDURE:  Audwin Semper was interviewed by Dr. Gelene Mink of Anesthesia and after detailed anesthesia informed consent selected general anesthesia by LMA technique.  Mr. Kroon was then transferred to room 2 of the [facility] where under Dr. Thornton Dales direct supervision, general anesthesia by LMA technique was induced.  The left hand and arm were prepped with Betadine soap and solution, sterilely draped.  A pneumatic tourniquet was applied to the proximal left brachium.  Following exsanguination of left arm with Esmarch bandage, arterial tourniquet was inflated to 220 mmHg.  Following routine surgical time-out, procedure commenced with a planned extensile zigzag incision to the wrist flexion creases.  We chose the central limb to begin and created a 2-cm incision directly over the apex of the cyst.  Subcutaneous tissues were carefully divided taking care to identify the radial artery proper, the superficial palmar branch, and the dorsal branch.  The cyst was presenting directly  in the notch between the superficial and dorsal branches.  The artery branches were carefully dissected free and one of the branches of the carpus was clearly involved with the cyst.  This was dissected off the wall of the cyst and ultimately electrocauterized.  We followed the cyst circumferentially to the volar wrist capsule.  This appeared to be on the radioscaphoid capitate ligament.  The cyst was pierced and its contents drained and then the interior of the cyst probed with a Henner elevator to find the entrance point to the wrist.  A fine  rongeur was used to avulse the neck of the cyst from the volar capsular ligaments followed by debridement of this adjacent ligament tissues taking care to preserve the radioscaphoid capitate ligament.  The bipolar cautery was then used to electrocauterize the capsular tissues including synovium.  There were a number of minor venous bleeders that were controlled with saline and bipolar cautery.  The tourniquet was released and flow through the radial artery was noted to be normal particularly the dorsal branch.  The wound was inspected for 5 minutes to be sure there was no late bleeding.  The wound was then repaired with layers with subcutaneous 4-0 Vicryl and intradermal 3-0 Prolene with Steri-Strips.  2% lidocaine was infiltrated for postoperative analgesia.  There were no apparent complications.  For aftercare, Mr. Silbernagel is provided a prescription for Percocet 5 mg 1 p.o. q.4-6 hours p.r.n. pain, 20 tablets without refill.  He will likely use Aleve or Advil as a primary analgesic.     Katy Fitch Randle Shatzer, M.D.     RVS/MEDQ  D:  01/17/2013  T:  01/17/2013  Job:  [id number]  cc:   Ernestina Penna, M.D.

## 2013-01-17 NOTE — Anesthesia Postprocedure Evaluation (Signed)
Anesthesia Post Note  Patient: [name] [name]  Procedure(s) Performed: Procedure(s) (LRB): EXCISION VOLAR MYXOID CYST LEFT WRIST (Left)  Anesthesia type: General  Patient location: PACU  Post pain: Pain level controlled  Post assessment: Patient's Cardiovascular Status Stable  Last Vitals:  Filed Vitals:   01/17/13 1157  BP: 164/90  Pulse: 70  Temp: 36.8 C  Resp: 16    Post vital signs: Reviewed and stable  Level of consciousness: alert  Complications: No apparent anesthesia complications

## 2013-01-17 NOTE — Op Note (Signed)
NAME:  [name], [name]                  ACCOUNT NO.:  [id number]  MEDICAL RECORD NO.:  [id number]  LOCATION:                               FACILITY:  [facility]  PHYSICIAN:  Manvi Guilliams V. Aksel Bencomo, M.D. 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  Identification of origin of cyst from capsule of the wrist joint, overlying scaphoid, and radial scaphocapitate ligament.  OPERATION:  Debridement of myxoid cyst from volar capsular ligaments, left wrist.  OPERATING SURGEON:  Jazzmine Kleiman V. Makoa Satz, M.D.  ASSISTANT:  Surgical technician.  ANESTHESIA:  General by LMA.  SUPERVISING ANESTHESIOLOGIST:  Charles E. Frederick, M.D.  INDICATIONS:  [name] [name] is a 47-year-old gentleman employed by [employer] who is well acquainted with our practice.  He presented for evaluation of a left volar myxoid cyst presenting over the radioscaphoid capitate ligament at the distal left wrist just proximal to the distal wrist flexion crease.  This was mobile, but was uncomfortable and bothersome to him.  We had a detailed informed consent with [name] [name] explaining how the cyst typically tracked the branches of the radial artery.  They can involve the wall of the radial artery either dorsal branch or superficial branch and typically originate on the volar wrist capsule.  We advised him to observe this over a period of time.  We told him we could debride it, though he would be faced with a significant risk of recurrence due to the natural history of these volar  cyst.  After informed consent, he chose to return at this time for cyst excision.  In the holding area, his left hand and arm were marked as the proper surgical site per the identification protocol with a marking pen, and questions were invited and answered in detail.  He understands that we cannot guarantee that he will not develop another cyst.  PROCEDURE:  [name] [name] was interviewed by Dr. Frederick of Anesthesia and after detailed anesthesia informed consent selected general anesthesia by LMA technique.  [name] [name] was then transferred to room 2 of the [facility] where under Dr. Frederick's direct supervision, general anesthesia by LMA technique was induced.  The left hand and arm were prepped with Betadine soap and solution, sterilely draped.  A pneumatic tourniquet was applied to the proximal left brachium.  Following exsanguination of left arm with Esmarch bandage, arterial tourniquet was inflated to 220 mmHg.  Following routine surgical time-out, procedure commenced with a planned extensile zigzag incision to the wrist flexion creases.  We chose the central limb to begin and created a 2-cm incision directly over the apex of the cyst.  Subcutaneous tissues were carefully divided taking care to identify the radial artery proper, the superficial palmar branch, and the dorsal branch.  The cyst was presenting directly   in the notch between the superficial and dorsal branches.  The artery branches were carefully dissected free and one of the branches of the carpus was clearly involved with the cyst.  This was dissected off the wall of the cyst and ultimately electrocauterized.  We followed the cyst circumferentially to the volar wrist capsule.  This appeared to be on the radioscaphoid capitate ligament.  The cyst was pierced and its contents drained and then the interior of the cyst probed with a Henner elevator to find the entrance point to the wrist.  A fine  rongeur was used to avulse the neck of the cyst from the volar capsular ligaments followed by debridement of this adjacent ligament tissues taking care to preserve the radioscaphoid capitate ligament.  The bipolar cautery was then used to electrocauterize the capsular tissues including synovium.  There were a number of minor venous bleeders that were controlled with saline and bipolar cautery.  The tourniquet was released and flow through the radial artery was noted to be normal particularly the dorsal branch.  The wound was inspected for 5 minutes to be sure there was no late bleeding.  The wound was then repaired with layers with subcutaneous 4-0 Vicryl and intradermal 3-0 Prolene with Steri-Strips.  2% lidocaine was infiltrated for postoperative analgesia.  There were no apparent complications.  For aftercare, [name] [name] is provided a prescription for Percocet 5 mg 1 p.o. q.4-6 hours p.r.n. pain, 20 tablets without refill.  He will likely use Aleve or Advil as a primary analgesic.     [name] [name] V. Ascencion Stegner, M.D.     RVS/MEDQ  D:  01/17/2013  T:  01/17/2013  Job:  [id number]  cc:   Donald W. Moore, M.D. 

## 2013-01-17 NOTE — Anesthesia Procedure Notes (Signed)
Procedure Name: LMA Insertion Date/Time: 01/17/2013 9:23 AM Performed by: Gar Gibbon Pre-anesthesia Checklist: Patient identified, Emergency Drugs available, Suction available and Patient being monitored Patient Re-evaluated:Patient Re-evaluated prior to inductionOxygen Delivery Method: Circle System Utilized Preoxygenation: Pre-oxygenation with 100% oxygen Intubation Type: IV induction Ventilation: Mask ventilation without difficulty LMA: LMA inserted LMA Size: 4.0 Number of attempts: 1 Airway Equipment and Method: bite block Placement Confirmation: positive ETCO2 Tube secured with: Tape Dental Injury: Teeth and Oropharynx as per pre-operative assessment

## 2013-01-17 NOTE — Brief Op Note (Signed)
01/17/2013  10:07 AM  PATIENT:  [name] [name]  47 y.o. male  PRE-OPERATIVE DIAGNOSIS:  LEFT WRIST VOLAR MYXOID CYST  POST-OPERATIVE DIAGNOSIS:  LEFT WRIST VOLAR MYXOID CYST  PROCEDURE:  Procedure(s): EXCISION VOLAR MYXOID CYST LEFT WRIST (Left)  SURGEON:  Surgeon(s) and Role:    * Wyn Forster., MD - Primary  PHYSICIAN ASSISTANT:   ASSISTANTS: surgical technician   ANESTHESIA:   general  EBL:  Total I/O In: 1000 [I.V.:1000] Out: -   BLOOD ADMINISTERED:none  DRAINS: none   LOCAL MEDICATIONS USED:  LIDOCAINE   SPECIMEN:  No Specimen  DISPOSITION OF SPECIMEN:  N/A  COUNTS:  YES  TOURNIQUET:   Total Tourniquet Time Documented: Upper Arm (Left) - 13 minutes Total: Upper Arm (Left) - 13 minutes   DICTATION: .Other Dictation: Dictation Number [id number]  PLAN OF CARE: Discharge to home after PACU  PATIENT DISPOSITION:  PACU - hemodynamically stable.   Delay start of Pharmacological VTE agent (>24hrs) due to surgical blood loss or risk of bleeding: not applicable

## 2013-01-17 NOTE — Transfer of Care (Signed)
Immediate Anesthesia Transfer of Care Note  Patient: [name] [name]  Procedure(s) Performed: Procedure(s): EXCISION VOLAR MYXOID CYST LEFT WRIST (Left)  Patient Location: PACU  Anesthesia Type:General  Level of Consciousness: sedated and patient cooperative  Airway & Oxygen Therapy: Patient Spontanous Breathing and Patient connected to face mask oxygen  Post-op Assessment: Report given to PACU RN and Post -op Vital signs reviewed and stable  Post vital signs: Reviewed and stable  Complications: No apparent anesthesia complications

## 2013-01-17 NOTE — Anesthesia Preprocedure Evaluation (Signed)
Anesthesia Evaluation  Patient identified by MRN, date of birth, ID band Patient awake    Reviewed: Allergy & Precautions, H&P , NPO status , Patient's Chart, lab work & pertinent test results, reviewed documented beta blocker date and time   Airway Mallampati: II TM Distance: >3 FB Neck ROM: full    Dental   Pulmonary neg pulmonary ROS,  breath sounds clear to auscultation        Cardiovascular hypertension, Pt. on medications Rhythm:regular     Neuro/Psych negative neurological ROS  negative psych ROS   GI/Hepatic negative GI ROS, Neg liver ROS,   Endo/Other  negative endocrine ROS  Renal/GU negative Renal ROS  negative genitourinary   Musculoskeletal   Abdominal   Peds  Hematology negative hematology ROS (+)   Anesthesia Other Findings See surgeon's H&P   Reproductive/Obstetrics negative OB ROS                           Anesthesia Physical Anesthesia Plan  ASA: II  Anesthesia Plan: General   Post-op Pain Management:    Induction: Intravenous  Airway Management Planned: LMA  Additional Equipment:   Intra-op Plan:   Post-operative Plan:   Informed Consent: I have reviewed the patients History and Physical, chart, labs and discussed the procedure including the risks, benefits and alternatives for the proposed anesthesia with the patient or authorized representative who has indicated his/her understanding and acceptance.   Dental Advisory Given  Plan Discussed with: CRNA and Surgeon  Anesthesia Plan Comments:         Anesthesia Quick Evaluation

## 2013-01-17 NOTE — Op Note (Signed)
[id number] 

## 2013-01-21 ENCOUNTER — Encounter (HOSPITAL_BASED_OUTPATIENT_CLINIC_OR_DEPARTMENT_OTHER): Payer: Self-pay | Admitting: Orthopedic Surgery

## 2013-01-30 ENCOUNTER — Encounter: Payer: Self-pay | Admitting: Cardiology

## 2013-01-30 DIAGNOSIS — I10 Essential (primary) hypertension: Secondary | ICD-10-CM | POA: Insufficient documentation

## 2013-01-30 DIAGNOSIS — G8929 Other chronic pain: Secondary | ICD-10-CM | POA: Insufficient documentation

## 2013-02-01 ENCOUNTER — Encounter: Payer: Self-pay | Admitting: Cardiology

## 2013-02-01 ENCOUNTER — Ambulatory Visit (INDEPENDENT_AMBULATORY_CARE_PROVIDER_SITE_OTHER): Payer: 59 | Admitting: Cardiology

## 2013-02-01 VITALS — BP 150/100 | HR 67 | Ht 70.0 in | Wt 206.0 lb

## 2013-02-01 DIAGNOSIS — I10 Essential (primary) hypertension: Secondary | ICD-10-CM

## 2013-02-01 DIAGNOSIS — R0789 Other chest pain: Secondary | ICD-10-CM

## 2013-02-01 DIAGNOSIS — R002 Palpitations: Secondary | ICD-10-CM

## 2013-02-01 MED ORDER — LISINOPRIL-HYDROCHLOROTHIAZIDE 20-25 MG PO TABS: 1.0000 | ORAL_TABLET | Freq: Every day | ORAL | Status: DC

## 2013-02-01 NOTE — Progress Notes (Signed)
[address] [place], [place]  [address] Phone: [phone] Fax:  [phone]  Date:  02/01/2013   ID:  TITUS DRONE, DOB 23-Apr-1965, MRN [id number]  PCP:  Rudi Heap, MD   History of Present Illness: [name] [name] is a 47 y.o. male has been borderline hypertensive here in the office setting. At home usually normal. After the last time doing this, his PCP, Dr. Christell Constant, placed him on lisinopril. BP continued to be high despite this medication. Had suggestion to add HCTZ. He converted over to this. On a Friday in mid Sept, went home, rode his stationary bike and felt "odd". Heart felt like it was racing. Would not stop. HR was 130bpm on monitor at home and BP was low 100/50. This was unusual for him. Sat down and tried to relax. 2 hours later was 100, at night 80, next morning normal. Felt some discomfort after this in chest wall for several days. Went on business trip, felt tightness in mid chest, mild to moderate. He thought that he may of strained something, but not sure. He worries about this. Has had vagal reactions in the past with blood draw.   MOTHER has AFIB for may years. 3 Grandparents with death from MI.    No decongestants, No ETOH. No TOB.    Wt Readings from Last 3 Encounters:  02/01/13 206 lb (93.441 kg)  01/17/13 206 lb (93.441 kg)  01/17/13 206 lb (93.441 kg)     Past Medical History  Diagnosis Date  . Chronic pain   . Hypertension     no meds  . Dental crown present     Past Surgical History  Procedure Laterality Date  . Right shoulder surgery  2010  . Right bicep surgery  2008    Radial nerve and cyst removal-bicep rupture  . Ganglion cyst excision  2009    rt wrist  . Shoulder arthroscopy  98,09    right  . Colonoscopy    . Ganglion cyst excision Left 01/17/2013    Procedure: EXCISION VOLAR MYXOID CYST LEFT WRIST;  Surgeon: Wyn Forster., MD;  Location: [facility];  Service: Orthopedics;  Laterality: Left;     Current Outpatient Prescriptions  Medication Sig Dispense Refill  . cholecalciferol (VITAMIN D) 1000 UNITS tablet Take 1,000 Units by mouth daily. 5000      . fish oil-omega-3 fatty acids 1000 MG capsule Take 2 g by mouth daily.      Marland Kitchen ibuprofen (ADVIL,MOTRIN) 600 MG tablet Take 600 mg by mouth every 6 (six) hours as needed for pain.      . Multiple Vitamins-Minerals (MULTIVITAMIN WITH MINERALS) tablet Take 1 tablet by mouth daily.      Marland Kitchen lisinopril-hydrochlorothiazide (PRINZIDE,ZESTORETIC) 20-25 MG per tablet        No current facility-administered medications for this visit.    Allergies:   Not on File  Social History:  The patient  reports that he has never smoked. He has never used smokeless tobacco. He reports that he does not drink alcohol or use illicit drugs. works at [employer].  Family History  Problem Relation Age of Onset  . Heart disease Mother   . Diabetes Mother   . Cancer Father     kidney    ROS:  Please see the history of present illness.   No bleeding, no syncope, no orthopnea, no SOB.   All other systems reviewed and negative.  PHYSICAL EXAM: VS:  BP 150/100  Pulse 67  Ht 5\' 10"  (1.778 m)  Wt 206 lb (93.441 kg)  BMI 29.56 kg/m2 Well nourished, well developed, in no acute distress, mildly anxious HEENT: normal, [place]/AT, EOMI Neck: no JVD, normal carotid upstroke, no bruit Cardiac:  normal S1, S2; RRR; no murmur Lungs:  clear to auscultation bilaterally, no wheezing, rhonchi or rales Abd: soft, nontender, no hepatomegaly, no bruits Ext: no edema, 2+ distal pulses Skin: warm and dry GU: deferred Neuro: no focal abnormalities noted, AAO x 3  EKG:  Sinus rhythm, 67, no ST segment changes Hemoglobin previously normal. TSH pending, basic metabolic profile pending Prior medical records reviewed  ASSESSMENT AND PLAN:  1. Atypical chest tightness-he noticed this after his bout of palpitations. I will check an exercise treadmill test to ensure that there is  no evidence of atypical arrhythmias during exercise or ischemia. 2. Palpitations-these could of been secondary to paroxysmal atrial tachycardia, quite incessant at 130 beats per minute. Avoid excessive caffeine, good sleep hygiene. I will check a TSH. Since he is resuming/restarting his lisinopril/HCTZ, in one week I will check a basic metabolic profile to ensure that he is not having any evidence of hypokalemia which can potentiate arrhythmias. 3. Hypertension-uncontrolled. Resume HCTZ/lisinopril. Expressed the importance of good hypertension control in stroke andheart attack prevention.  Signed, Donato Schultz, MD [facility]  02/01/2013 3:45 PM

## 2013-02-01 NOTE — Patient Instructions (Addendum)
Your physician has recommended you make the following change in your medication:   1. Lisinopril-HCTZ 20-25 mg  Once daily  Your physician has requested that you have an exercise tolerance test. For further information please visit [email or web address] Please also follow instruction sheet, as given.  Your physician recommends that you return for lab work in: 1 week, BMET, TSH  Your physician wants you to follow-up in: 6 months with Dr. Algis Liming will receive a reminder letter in the mail two months in advance. If you don't receive a letter, please call our office to schedule the follow-up appointment.

## 2013-02-06 ENCOUNTER — Ambulatory Visit (INDEPENDENT_AMBULATORY_CARE_PROVIDER_SITE_OTHER): Payer: 59 | Admitting: Family Medicine

## 2013-02-06 ENCOUNTER — Encounter: Payer: Self-pay | Admitting: Family Medicine

## 2013-02-06 ENCOUNTER — Encounter (INDEPENDENT_AMBULATORY_CARE_PROVIDER_SITE_OTHER): Payer: Self-pay

## 2013-02-06 VITALS — BP 154/92 | HR 80 | Temp 97.1°F | Ht 70.0 in | Wt 206.0 lb

## 2013-02-06 DIAGNOSIS — R229 Localized swelling, mass and lump, unspecified: Secondary | ICD-10-CM

## 2013-02-06 DIAGNOSIS — S139XXA Sprain of joints and ligaments of unspecified parts of neck, initial encounter: Secondary | ICD-10-CM

## 2013-02-06 DIAGNOSIS — S161XXA Strain of muscle, fascia and tendon at neck level, initial encounter: Secondary | ICD-10-CM

## 2013-02-06 DIAGNOSIS — R2241 Localized swelling, mass and lump, right lower limb: Secondary | ICD-10-CM

## 2013-02-06 MED ORDER — PREDNISONE 50 MG PO TABS: ORAL_TABLET | ORAL | Status: DC

## 2013-02-06 MED ORDER — CYCLOBENZAPRINE HCL 10 MG PO TABS: 10.0000 mg | ORAL_TABLET | Freq: Three times a day (TID) | ORAL | Status: DC | PRN

## 2013-02-08 ENCOUNTER — Ambulatory Visit: Payer: 59 | Admitting: *Deleted

## 2013-02-08 DIAGNOSIS — I10 Essential (primary) hypertension: Secondary | ICD-10-CM

## 2013-02-08 LAB — BASIC METABOLIC PANEL
BUN: 20 mg/dL (ref 6–23)
CO2: 26 mEq/L (ref 19–32)
Calcium: 10.4 mg/dL (ref 8.4–10.5)
Chloride: 99 mEq/L (ref 96–112)
Glucose, Bld: 133 mg/dL — ABNORMAL HIGH (ref 70–99)
Potassium: 3.7 mEq/L (ref 3.5–5.3)

## 2013-02-08 NOTE — Progress Notes (Signed)
  Subjective:    Patient ID: [name] [name], male    DOB: 1965/10/03, 47 y.o.   MRN: [id number]  HPI Patient presents today with chief complaint right leg. Patient states he has felt a nodule on his right upper thigh for the past at least year, though he believes has been present for several years. Has had a palpable area on the right upper thigh that is nontender. No erythema. No fevers or chills. Area has not progressively swollen. No recent trauma to the area. Patient does exercise.  Patient also reports cervical strain on the right side. Works out has chronic right neck pain. Seems to have worsened over the past 2-3 weeks. No radicular symptoms. Pain worse with lateral movement of the neck Review of Systems  All other systems reviewed and are negative.       Objective:   Physical Exam  Constitutional: He appears well-developed and well-nourished.  HENT:  Head: Normocephalic and atraumatic.  Eyes: Conjunctivae are normal. Pupils are equal, round, and reactive to light.  Neck: Normal range of motion.  Cardiovascular: Normal rate and regular rhythm.   Pulmonary/Chest: Effort normal and breath sounds normal.  Abdominal: Soft.  Musculoskeletal: Normal range of motion.       Back:       Legs: Positive tenderness to palpation along the right trapezius. Positive pain over affected area with lateral neck movement  Small 2x2 cm subcutaneous nodule on R upper thigh  Nontender  No erythema    Neurological: He is alert.          Assessment & Plan:  Cervical strain, initial encounter - Plan: cyclobenzaprine (FLEXERIL) 10 MG tablet, predniSONE (DELTASONE) 50 MG tablet  Mass of thigh, right - Plan: [place] Misc Soft Tissue  Suspect sebaceous cyst of right upper thigh. We'll obtain a soft tissue ultrasound to better correlate. No concern reflex on exam today.  Prednisone Flexeril for her cervical strain. Discussed musculoskeletal red flags. Follow up as needed.

## 2013-02-11 ENCOUNTER — Telehealth: Payer: Self-pay | Admitting: Family Medicine

## 2013-02-11 NOTE — Progress Notes (Signed)
Left message for patient to call the office

## 2013-02-13 ENCOUNTER — Other Ambulatory Visit: Payer: Self-pay

## 2013-02-13 DIAGNOSIS — R2241 Localized swelling, mass and lump, right lower limb: Secondary | ICD-10-CM

## 2013-02-18 ENCOUNTER — Ambulatory Visit
Admission: RE | Admit: 2013-02-18 | Discharge: 2013-02-18 | Disposition: A | Discharge status: Home / Self Care | Payer: 59 | Source: Ambulatory Visit | Attending: Family Medicine | Admitting: Family Medicine

## 2013-02-18 DIAGNOSIS — R2241 Localized swelling, mass and lump, right lower limb: Secondary | ICD-10-CM

## 2013-02-25 ENCOUNTER — Telehealth: Payer: Self-pay | Admitting: Family Medicine

## 2013-02-27 NOTE — Telephone Encounter (Signed)
Patient aware.

## 2013-03-07 ENCOUNTER — Ambulatory Visit (INDEPENDENT_AMBULATORY_CARE_PROVIDER_SITE_OTHER): Payer: 59 | Admitting: Cardiology

## 2013-03-07 DIAGNOSIS — R0789 Other chest pain: Secondary | ICD-10-CM

## 2013-03-07 NOTE — Progress Notes (Signed)
Exercise Treadmill Test  Pre-Exercise Testing Evaluation Rhythm: normal sinus  Rate: 78 bpm     Test  Exercise Tolerance Test Ordering MD: Donato Schultz, MD  Interpreting MD: Donato Schultz, MD  Unique Test No: 1  Treadmill:  1  Indication for ETT: chest pain - rule out ischemia  Contraindication to ETT: No   Stress Modality: exercise - treadmill  Cardiac Imaging Performed: non   Protocol: standard Bruce - maximal  Max BP:  208/91  Max MPHR (bpm):  173 85% MPR (bpm):  147  MPHR obtained (bpm):  166 % MPHR obtained:  95%  Reached 85% MPHR (min:sec):  8:48 Total Exercise Time (min-sec):  13:00  Workload in METS:  15.4 Borg Scale: 14  Reason ETT Terminated:  desired heart rate attained    ST Segment Analysis At Rest: normal ST segments - no evidence of significant ST depression With Exercise: no evidence of significant ST depression  Other Information Arrhythmia:  No Angina during ETT:  absent (0) Quality of ETT:  diagnostic  ETT Interpretation:  normal - no evidence of ischemia by ST analysis  Comments: Elevated BP at rest and at exercise. Recently restarted ACE-I/HCTZ combo. I have asked him to check this at home and let either me of Dr. Christell Constant know if BP > 140/90. May need further medications.   Recommendations: He can follow up with me on as needed basis.

## 2013-06-03 ENCOUNTER — Ambulatory Visit (INDEPENDENT_AMBULATORY_CARE_PROVIDER_SITE_OTHER): Payer: 59 | Admitting: Family Medicine

## 2013-06-03 ENCOUNTER — Ambulatory Visit (INDEPENDENT_AMBULATORY_CARE_PROVIDER_SITE_OTHER): Payer: 59

## 2013-06-03 ENCOUNTER — Encounter: Payer: Self-pay | Admitting: Family Medicine

## 2013-06-03 VITALS — BP 130/79 | HR 77 | Temp 97.5°F | Ht 70.0 in | Wt 201.0 lb

## 2013-06-03 DIAGNOSIS — R7309 Other abnormal glucose: Secondary | ICD-10-CM

## 2013-06-03 DIAGNOSIS — Z0000 Encounter for general adult medical examination without abnormal findings: Secondary | ICD-10-CM

## 2013-06-03 DIAGNOSIS — M545 Low back pain, unspecified: Secondary | ICD-10-CM

## 2013-06-03 DIAGNOSIS — M25569 Pain in unspecified knee: Secondary | ICD-10-CM

## 2013-06-03 DIAGNOSIS — I10 Essential (primary) hypertension: Secondary | ICD-10-CM

## 2013-06-03 DIAGNOSIS — M25562 Pain in left knee: Secondary | ICD-10-CM

## 2013-06-03 DIAGNOSIS — E559 Vitamin D deficiency, unspecified: Secondary | ICD-10-CM

## 2013-06-03 LAB — POCT URINALYSIS DIPSTICK
Bilirubin, UA: NEGATIVE
Glucose, UA: NEGATIVE
Ketones, UA: NEGATIVE
Leukocytes, UA: NEGATIVE
NITRITE UA: NEGATIVE
RBC UA: NEGATIVE
Urobilinogen, UA: NEGATIVE
pH, UA: 5

## 2013-06-03 LAB — POCT CBC
Granulocyte percent: 54.1 %G (ref 37–80)
HEMATOCRIT: 43.4 % — AB (ref 43.5–53.7)
HEMOGLOBIN: 13.7 g/dL — AB (ref 14.1–18.1)
Lymph, poc: 2.5 (ref 0.6–3.4)
MCH: 27.8 pg (ref 27–31.2)
MCHC: 31.6 g/dL — AB (ref 31.8–35.4)
MCV: 88 fL (ref 80–97)
MPV: 7.6 fL (ref 0–99.8)
POC Granulocyte: 3.5 (ref 2–6.9)
POC LYMPH %: 38.2 % (ref 10–50)
Platelet Count, POC: 417 10*3/uL (ref 142–424)
RBC: 4.9 M/uL (ref 4.69–6.13)
RDW, POC: 12.8 %
WBC: 6.5 10*3/uL (ref 4.6–10.2)

## 2013-06-03 LAB — POCT UA - MICROSCOPIC ONLY
CASTS, UR, LPF, POC: NEGATIVE
CRYSTALS, UR, HPF, POC: NEGATIVE
EPITHELIAL CELLS, URINE PER MICROSCOPY: NEGATIVE
Yeast, UA: NEGATIVE

## 2013-06-03 NOTE — Patient Instructions (Addendum)
Continue current medications. Continue good therapeutic lifestyle changes which include good diet and exercise. Fall precautions discussed with patient. If an FOBT was given today- please return it to our front desk.   Continue regular followups with the cardiologist just because of family history  We will call you with the results of the chest x-ray once those results are available and also the LS-spine films. Please return the FOBT

## 2013-06-03 NOTE — Progress Notes (Signed)
Subjective:    Patient ID: [name] [name], male    DOB: 05-21-1965, 48 y.o.   MRN: [id number]  HPI Patient is here today for annual wellness exam and follow up of chronic medical problems. SMA the patient fell on the ice a couple weeks ago and then he is still having low back pain. Is also important to note that his mom who has had a cardiac arrhythmia and has been worked up extensively recently had a major stroke following mapping of her dysrhythmia in [place]. The patient will get a chest x-ray today, and FOBT, and have lab work done. He had a stress test done in December and this was okay.       Patient Active Problem List   Diagnosis Date Noted  . Chronic pain   . Hypertension   . RECTAL BLEEDING 01/22/2008  . FECAL OCCULT BLOOD 01/22/2008   Outpatient Encounter Prescriptions as of 06/03/2013  Medication Sig  . cholecalciferol (VITAMIN D) 1000 UNITS tablet Take 1,000 Units by mouth daily. 5000  . fish oil-omega-3 fatty acids 1000 MG capsule Take 2 g by mouth daily.  Marland Kitchen ibuprofen (ADVIL,MOTRIN) 600 MG tablet Take 600 mg by mouth every 6 (six) hours as needed for pain.  Marland Kitchen lisinopril-hydrochlorothiazide (PRINZIDE,ZESTORETIC) 20-25 MG per tablet Take 1 tablet by mouth daily.  . Multiple Vitamins-Minerals (MULTIVITAMIN WITH MINERALS) tablet Take 1 tablet by mouth daily.  . [DISCONTINUED] cyclobenzaprine (FLEXERIL) 10 MG tablet Take 1 tablet (10 mg total) by mouth 3 (three) times daily as needed for muscle spasms.  . [DISCONTINUED] predniSONE (DELTASONE) 50 MG tablet 1 tab daily x 5 days    Review of Systems  Constitutional: Negative.   HENT: Negative.   Eyes: Negative.   Respiratory: Negative.   Cardiovascular: Negative.   Gastrointestinal: Negative.   Endocrine: Negative.   Genitourinary: Negative.   Musculoskeletal: Positive for arthralgias (left knee pain- going to [place] this month).       Fell on ice 2 weeks ago - still having pain  Skin: Negative.     Allergic/Immunologic: Negative.   Neurological: Negative.   Hematological: Negative.   Psychiatric/Behavioral: Negative.        Objective:   Physical Exam  Nursing note and vitals reviewed. Constitutional: He is oriented to person, place, and time. He appears well-developed and well-nourished. No distress.  HENT:  Head: Normocephalic and atraumatic.  Right Ear: External ear normal.  Left Ear: External ear normal.  Mouth/Throat: Oropharynx is clear and moist. No oropharyngeal exudate.  Minimal nasal congestion bilaterally  Eyes: Conjunctivae and EOM are normal. Pupils are equal, round, and reactive to light. Right eye exhibits no discharge. Left eye exhibits no discharge. No scleral icterus.  Neck: Normal range of motion. Neck supple. No tracheal deviation present. No thyromegaly present.  Cardiovascular: Normal rate, regular rhythm, normal heart sounds and intact distal pulses.  Exam reveals no gallop and no friction rub.   No murmur heard. At 84 per minute  Pulmonary/Chest: Effort normal and breath sounds normal. No respiratory distress. He has no wheezes. He has no rales. He exhibits no tenderness.  Abdominal: Soft. Bowel sounds are normal. He exhibits no mass. There is no tenderness. There is no rebound and no guarding.  Genitourinary: Rectum normal, prostate normal and penis normal.  Musculoskeletal: Normal range of motion. He exhibits no edema and no tenderness.  Slight tenderness left knee medial joint line. Slight tenderness with movement at lower lumbar spine area.  Lymphadenopathy:  He has no cervical adenopathy.  Neurological: He is alert and oriented to person, place, and time. He has normal reflexes. No cranial nerve deficit.  Skin: Skin is warm and dry. No rash noted. No erythema. No pallor.  Psychiatric: He has a normal mood and affect. His behavior is normal. Judgment and thought content normal.   BP 130/79  Pulse 77  Temp(Src) 97.5 F (36.4 C) (Oral)  Ht 5'  10" (1.778 m)  Wt 201 lb (91.173 kg)  BMI 28.84 kg/m2  [facility] reading (PRIMARY) by  Dr. Brunilda Payor x-ray and LS spine --- no active disease on the chest x-ray and no sign of any fracture on the LS-spine                                         Assessment & Plan:   1. Hypertension - BMP8+EGFR - Hepatic function panel  2. Annual physical exam - POCT CBC - POCT UA - Microscopic Only - POCT urinalysis dipstick - BMP8+EGFR - Hepatic function panel - NMR, lipoprofile - PSA, total and free - Thyroid Panel With TSH - Vit D  25 hydroxy (rtn osteoporosis monitoring) - DG Chest 2 View; Future  3. Low back pain - DG Lumbar Spine 2-3 Views; Future  4. Vitamin D deficiency  5. Left knee pain -Followup with orthopedist as planned  No orders of the defined types were placed in this encounter.   Patient Instructions  Continue current medications. Continue good therapeutic lifestyle changes which include good diet and exercise. Fall precautions discussed with patient. If an FOBT was given today- please return it to our front desk.   Continue regular followups with the cardiologist just because of family history  We will call you with the results of the chest x-ray once those results are available and also the LS-spine films. Please return the FOBT    Arrie Senate MD

## 2013-06-04 LAB — PSA, TOTAL AND FREE
PSA FREE: 0.19 ng/mL
PSA, Free Pct: 21.1 %
PSA: 0.9 ng/mL (ref 0.0–4.0)

## 2013-06-04 LAB — HEPATIC FUNCTION PANEL
ALBUMIN: 4.6 g/dL (ref 3.5–5.5)
ALK PHOS: 69 IU/L (ref 39–117)
ALT: 24 IU/L (ref 0–44)
AST: 15 IU/L (ref 0–40)
Bilirubin, Direct: 0.15 mg/dL (ref 0.00–0.40)
Total Bilirubin: 0.4 mg/dL (ref 0.0–1.2)
Total Protein: 7.2 g/dL (ref 6.0–8.5)

## 2013-06-04 LAB — THYROID PANEL WITH TSH
Free Thyroxine Index: 2.2 (ref 1.2–4.9)
T3 Uptake Ratio: 24 % (ref 24–39)
T4 TOTAL: 9.3 ug/dL (ref 4.5–12.0)
TSH: 2.16 u[IU]/mL (ref 0.450–4.500)

## 2013-06-04 LAB — BMP8+EGFR
BUN / CREAT RATIO: 22 — AB (ref 9–20)
BUN: 21 mg/dL (ref 6–24)
CO2: 22 mmol/L (ref 18–29)
Calcium: 10.3 mg/dL — ABNORMAL HIGH (ref 8.7–10.2)
Chloride: 102 mmol/L (ref 97–108)
Creatinine, Ser: 0.97 mg/dL (ref 0.76–1.27)
GFR calc non Af Amer: 93 mL/min/{1.73_m2} (ref >59)
GFR, EST AFRICAN AMERICAN: 107 mL/min/{1.73_m2} (ref >59)
Glucose: 146 mg/dL — ABNORMAL HIGH (ref 65–99)
Potassium: 4.9 mmol/L (ref 3.5–5.2)
Sodium: 143 mmol/L (ref 134–144)

## 2013-06-04 LAB — NMR, LIPOPROFILE
Cholesterol: 167 mg/dL (ref <200)
HDL Cholesterol by NMR: 35 mg/dL — ABNORMAL LOW (ref >=40)
HDL PARTICLE NUMBER: 27.5 umol/L — AB (ref >=30.5)
LDL Particle Number: 1582 nmol/L — ABNORMAL HIGH (ref <1000)
LDL Size: 20.2 nm — ABNORMAL LOW (ref >20.5)
LDLC SERPL CALC-MCNC: 86 mg/dL (ref <100)
LP-IR SCORE: 76 — AB (ref <=45)
SMALL LDL PARTICLE NUMBER: 834 nmol/L — AB (ref <=527)
Triglycerides by NMR: 228 mg/dL — ABNORMAL HIGH (ref <150)

## 2013-06-04 LAB — VITAMIN D 25 HYDROXY (VIT D DEFICIENCY, FRACTURES): Vit D, 25-Hydroxy: 72.6 ng/mL (ref 30.0–100.0)

## 2013-06-07 LAB — POCT GLYCOSYLATED HEMOGLOBIN (HGB A1C): HEMOGLOBIN A1C: 6.6

## 2013-06-07 NOTE — Addendum Note (Signed)
Addended by: Selmer Dominion on: 06/07/2013 10:58 AM   Modules accepted: Orders

## 2013-07-01 ENCOUNTER — Ambulatory Visit (INDEPENDENT_AMBULATORY_CARE_PROVIDER_SITE_OTHER): Payer: 59 | Admitting: Pharmacist

## 2013-07-01 ENCOUNTER — Encounter: Payer: Self-pay | Admitting: Pharmacist

## 2013-07-01 ENCOUNTER — Other Ambulatory Visit: Payer: Self-pay | Admitting: Pharmacist

## 2013-07-01 VITALS — BP 128/78 | HR 80 | Ht 70.0 in | Wt 200.0 lb

## 2013-07-01 DIAGNOSIS — E785 Hyperlipidemia, unspecified: Secondary | ICD-10-CM

## 2013-07-01 DIAGNOSIS — E1169 Type 2 diabetes mellitus with other specified complication: Secondary | ICD-10-CM

## 2013-07-01 DIAGNOSIS — E119 Type 2 diabetes mellitus without complications: Secondary | ICD-10-CM | POA: Insufficient documentation

## 2013-07-01 MED ORDER — ONETOUCH DELICA LANCETS 33G MISC: Status: DC

## 2013-07-01 MED ORDER — GLUCOSE BLOOD VI STRP
ORAL_STRIP | Status: DC
Start: 2013-07-01 — End: 2021-08-18

## 2013-07-01 NOTE — Progress Notes (Signed)
Diabetes Flow Sheet:  Visit 1  Chief Complaint:   Chief Complaint  Patient presents with  . Diabetes  . Hyperlipidemia    HPI - newly diagnosed type 2 DM with A1c of 6.6%.  Has never been instructed to limit CHO or fat in diet.  Positive family history of diabetes and heart disease.   Exam  Filed Vitals:   07/01/13 1210  BP: 128/78  Pulse: 80    Polyuria:  negative  Polydipsia:  negative  Polyphagia:  negative  BMI:  Body mass index is 28.7 kg/(m^2).   Weight changes:  stable General Appearance:  alert, oriented, no acute distress and well nourished Mood/Affect:  normal  Low fat/carbohydrate diet?  No Nicotine Abuse?  No Medication Compliance?  Yes Exercise?  Yes Alcohol Abuse?  No  Lab Results  Component Value Date   HGBA1C 6.6 06/07/2013    Lab Results  Component Value Date   CHOL 167 06/03/2013   LDL = 86 LDL-P = 1582 Tg = 228 HDL = 35  Medication Checklist: ACE Inhibitor/ARB?  Yes Lipid Lowering Agent?  No Aspirin?  No Oral Hypoglycemic Agent(s)?  No  Assessment: 1.  type 2 Diabetes.  Newly diagnosed 2.  Blood Pressure Control.  controlled 3.  Lipid Control.  Low HDL and high Tg, LDL at goal but LDL-P too high  Recommendations: 1.  1800 calorie, carbohydrate counting diet.  Patient is counseled extensively on carbohydrate counting, serving sizes, saturated fat intake and meal planning.  Patient is instructed to eat 3 meals a day and 3 small snacks.  Patient will supplement snacks based on physical activity. 2.  30 minutes of physical activity.  Patient is counseled to always carry glucose tablets, lifesavers, hard candies, etc., while exercising in case of hypoglycemic event. 3.  Patient is counseled on pathophysiology of diabetes and the risk of long-term complications.  Fasting blood glucose goals are 80-130mg /dL.  Post-prandial goals are < 180.  A1C goals < 6.5%. 4.  LDL goal of < 100, HDL > 40 and TG < 150; BP goal < 140/80 5.  Patient is counseled on  proper use of glucometer and lancing device.  Given one touch verio glucometer and taught to use in office.  Patient is informed to check BG 1-2 times daily and how to respond to unsuitable results. 6.  Medication recommendations at this time are as follows:  None RTC in 2 months to recheck labs - if not at goals then consider metformin and statin.  Time spent counseling patient:  45 minutes  Referring provider:  Redge Gainer    PharmD:  Cherre Robins, [facility]

## 2013-08-09 ENCOUNTER — Ambulatory Visit (INDEPENDENT_AMBULATORY_CARE_PROVIDER_SITE_OTHER): Payer: 59 | Admitting: Family Medicine

## 2013-08-09 VITALS — BP 117/73 | HR 53 | Temp 96.8°F | Ht 70.0 in | Wt 190.2 lb

## 2013-08-09 DIAGNOSIS — J069 Acute upper respiratory infection, unspecified: Secondary | ICD-10-CM

## 2013-08-09 MED ORDER — AZITHROMYCIN 250 MG PO TABS: ORAL_TABLET | ORAL | Status: DC

## 2013-08-09 MED ORDER — METHYLPREDNISOLONE ACETATE 80 MG/ML IJ SUSP
80.0000 mg | Freq: Once | INTRAMUSCULAR | Status: AC
  Administered 2013-08-09: 80 mg via INTRAMUSCULAR

## 2013-08-09 NOTE — Progress Notes (Signed)
   Subjective:    Patient ID: [name] [name], male    DOB: 07/25/1965, 48 y.o.   MRN: [id number]  HPI  This 48 y.o. male presents for evaluation of uri sx's for over a week.  Review of Systems    No chest pain, SOB, HA, dizziness, vision change, N/V, diarrhea, constipation, dysuria, urinary urgency or frequency, myalgias, arthralgias or rash.  Objective:   Physical Exam  Vital signs noted  Well developed well nourished male.  HEENT - Head atraumatic Normocephalic                Eyes - PERRLA, Conjuctiva - clear Sclera- Clear EOMI                Ears - EAC's Wnl TM's Wnl Gross Hearing WNL                Nose - Nares patent                 Throat - oropharanx wnl Respiratory - Lungs CTA bilateral Cardiac - RRR S1 and S2 without murmur GI - Abdomen soft Nontender and bowel sounds active x 4 Extremities - No edema. Neuro - Grossly intact.      Assessment & Plan:  URI (upper respiratory infection) - Plan: methylPREDNISolone acetate (DEPO-MEDROL) injection 80 mg, azithromycin (ZITHROMAX) 250 MG tablet Push po fluids, rest, tylenol and motrin otc prn as directed for fever, arthralgias, and myalgias.  Follow up prn if sx's continue or persist.  Lysbeth Penner FNP

## 2013-08-16 ENCOUNTER — Other Ambulatory Visit: Payer: Self-pay | Admitting: *Deleted

## 2013-08-16 MED ORDER — LISINOPRIL-HYDROCHLOROTHIAZIDE 20-25 MG PO TABS: 1.0000 | ORAL_TABLET | Freq: Every day | ORAL | Status: DC

## 2013-09-06 ENCOUNTER — Telehealth: Payer: Self-pay | Admitting: Family Medicine

## 2013-09-06 ENCOUNTER — Ambulatory Visit (INDEPENDENT_AMBULATORY_CARE_PROVIDER_SITE_OTHER): Payer: 59 | Admitting: Family

## 2013-09-06 ENCOUNTER — Encounter: Payer: Self-pay | Admitting: Family

## 2013-09-06 VITALS — BP 131/80 | HR 58 | Temp 97.1°F | Ht 70.0 in | Wt 185.6 lb

## 2013-09-06 DIAGNOSIS — H6690 Otitis media, unspecified, unspecified ear: Secondary | ICD-10-CM

## 2013-09-06 MED ORDER — AMOXICILLIN 500 MG PO TABS: 500.0000 mg | ORAL_TABLET | Freq: Two times a day (BID) | ORAL | Status: DC

## 2013-09-06 NOTE — Patient Instructions (Signed)
Otitis Media, Adult Otitis media is redness, soreness, and swelling (inflammation) of the middle ear. Otitis media may be caused by allergies or, most commonly, by infection. Often it occurs as a complication of the common cold. SIGNS AND SYMPTOMS Symptoms of otitis media may include:  Earache.  Fever.  Ringing in your ear.  Headache.  Leakage of fluid from the ear. DIAGNOSIS To diagnose otitis media, your health care provider will examine your ear with an otoscope. This is an instrument that allows your health care provider to see into your ear in order to examine your eardrum. Your health care provider also will ask you questions about your symptoms. TREATMENT  Typically, otitis media resolves on its own within 3 5 days. Your health care provider may prescribe medicine to ease your symptoms of pain. If otitis media does not resolve within 5 days or is recurrent, your health care provider may prescribe antibiotic medicines if he or she suspects that a bacterial infection is the cause. HOME CARE INSTRUCTIONS   Take your medicine as directed until it is gone, even if you feel better after the first few days.  Only take over-the-counter or prescription medicines for pain, discomfort, or fever as directed by your health care provider.  Follow up with your health care provider as directed. SEEK MEDICAL CARE IF:  You have otitis media only in one ear or bleeding from your nose or both.  You notice a lump on your neck.  You are not getting better in 3 5 days.  You feel worse instead of better. SEEK IMMEDIATE MEDICAL CARE IF:   You have pain that is not controlled with medicine.  You have swelling, redness, or pain around your ear or stiffness in your neck.  You notice that part of your face is paralyzed.  You notice that the bone behind your ear (mastoid) is tender when you touch it. MAKE SURE YOU:   Understand these instructions.  Will watch your condition.  Will get help  right away if you are not doing well or get worse. Document Released: 12/25/2003 Document Revised: 01/09/2013 Document Reviewed: 10/16/2012 [employer] Patient Information 2014 [employer].  

## 2013-09-06 NOTE — Progress Notes (Signed)
   Subjective:    Patient ID: [name] [name], male    DOB: 04/11/1965, 48 y.o.   MRN: [id number]  Ear Fullness  There is pain in both ears. This is a new problem. The current episode started more than 1 month ago. The problem occurs constantly. The problem has been waxing and waning. There has been no fever. The patient is experiencing no pain. Pertinent negatives include no coughing, ear discharge, headaches, rhinorrhea or sore throat. He has tried ear drops for the symptoms. The treatment provided mild relief. There is no history of a chronic ear infection or hearing loss.      Review of Systems  HENT: Negative.  Negative for ear discharge, rhinorrhea and sore throat.   Respiratory: Negative for cough.   Genitourinary: Negative.   Musculoskeletal: Negative.   Neurological: Negative for headaches.  Hematological: Negative.   Psychiatric/Behavioral: Negative.   All other systems reviewed and are negative.      Objective:   Physical Exam  Vitals reviewed. Constitutional: He is oriented to person, place, and time. He appears well-developed and well-nourished. No distress.  HENT:  Head: Normocephalic.  Right Ear: External ear normal.  Left Ear: External ear normal.  Mouth/Throat: Oropharynx is clear and moist.  Eyes: Pupils are equal, round, and reactive to light. Right eye exhibits no discharge. Left eye exhibits no discharge.  Neck: Normal range of motion. Neck supple. No thyromegaly present.  Cardiovascular: Normal rate, regular rhythm, normal heart sounds and intact distal pulses.   No murmur heard. Pulmonary/Chest: Effort normal and breath sounds normal. No respiratory distress. He has no wheezes.  Abdominal: Soft. Bowel sounds are normal. He exhibits no distension. There is no tenderness.  Musculoskeletal: Normal range of motion. He exhibits no edema and no tenderness.  Neurological: He is alert and oriented to person, place, and time.  Skin: Skin is warm and dry. No rash  noted. No erythema.  Psychiatric: He has a normal mood and affect. His behavior is normal. Judgment and thought content normal.      BP 131/80  Pulse 58  Temp(Src) 97.1 F (36.2 C) (Oral)  Ht 5\' 10"  (1.778 m)  Wt 185 lb 9.6 oz (84.188 kg)  BMI 26.63 kg/m2     Assessment & Plan:  1. Otitis media -Tylenol for pain prn -Do not stick anything in ears - amoxicillin (AMOXIL) 500 MG tablet; Take 1 tablet (500 mg total) by mouth 2 (two) times daily.  Dispense: 10 tablet; Refill: 0  Evelina Dun, FNP

## 2013-09-06 NOTE — Telephone Encounter (Signed)
Appt scheduled this afternoon Patient aware.

## 2013-09-09 ENCOUNTER — Other Ambulatory Visit (INDEPENDENT_AMBULATORY_CARE_PROVIDER_SITE_OTHER): Payer: 59

## 2013-09-09 DIAGNOSIS — E119 Type 2 diabetes mellitus without complications: Secondary | ICD-10-CM

## 2013-09-09 DIAGNOSIS — E785 Hyperlipidemia, unspecified: Secondary | ICD-10-CM

## 2013-09-09 DIAGNOSIS — I10 Essential (primary) hypertension: Secondary | ICD-10-CM

## 2013-09-09 LAB — POCT CBC
Granulocyte percent: 55.1 %G (ref 37–80)
HCT, POC: 42.1 % — AB (ref 43.5–53.7)
HEMOGLOBIN: 13.6 g/dL — AB (ref 14.1–18.1)
Lymph, poc: 2.7 (ref 0.6–3.4)
MCH, POC: 28.6 pg (ref 27–31.2)
MCHC: 32.3 g/dL (ref 31.8–35.4)
MCV: 88.6 fL (ref 80–97)
MPV: 7.3 fL (ref 0–99.8)
POC GRANULOCYTE: 3.9 (ref 2–6.9)
POC LYMPH PERCENT: 37.9 %L (ref 10–50)
Platelet Count, POC: 348 10*3/uL (ref 142–424)
RBC: 4.8 M/uL (ref 4.69–6.13)
RDW, POC: 13.1 %
WBC: 7 10*3/uL (ref 4.6–10.2)

## 2013-09-09 LAB — POCT GLYCOSYLATED HEMOGLOBIN (HGB A1C): HEMOGLOBIN A1C: 6

## 2013-09-09 NOTE — Progress Notes (Signed)
Pt came in for labs only 

## 2013-09-10 LAB — HEPATIC FUNCTION PANEL
ALT: 22 IU/L (ref 0–44)
AST: 16 IU/L (ref 0–40)
Albumin: 4.4 g/dL (ref 3.5–5.5)
Alkaline Phosphatase: 49 IU/L (ref 39–117)
BILIRUBIN DIRECT: 0.13 mg/dL (ref 0.00–0.40)
Total Bilirubin: 0.4 mg/dL (ref 0.0–1.2)
Total Protein: 6.7 g/dL (ref 6.0–8.5)

## 2013-09-10 LAB — NMR, LIPOPROFILE
Cholesterol: 155 mg/dL (ref 100–199)
HDL CHOLESTEROL BY NMR: 47 mg/dL (ref >39)
HDL Particle Number: 34.3 umol/L (ref >=30.5)
LDL PARTICLE NUMBER: 990 nmol/L (ref <1000)
LDL Size: 20.8 nm (ref >20.5)
LDLC SERPL CALC-MCNC: 94 mg/dL (ref 0–99)
LP-IR Score: 50 — ABNORMAL HIGH (ref <=45)
Small LDL Particle Number: 371 nmol/L (ref <=527)
Triglycerides by NMR: 72 mg/dL (ref 0–149)

## 2013-09-10 LAB — BMP8+EGFR
BUN/Creatinine Ratio: 21 — ABNORMAL HIGH (ref 9–20)
BUN: 19 mg/dL (ref 6–24)
CALCIUM: 9.5 mg/dL (ref 8.7–10.2)
CO2: 24 mmol/L (ref 18–29)
Chloride: 98 mmol/L (ref 97–108)
Creatinine, Ser: 0.91 mg/dL (ref 0.76–1.27)
GFR calc Af Amer: 116 mL/min/{1.73_m2} (ref >59)
GFR calc non Af Amer: 100 mL/min/{1.73_m2} (ref >59)
Glucose: 109 mg/dL — ABNORMAL HIGH (ref 65–99)
POTASSIUM: 3.8 mmol/L (ref 3.5–5.2)
SODIUM: 140 mmol/L (ref 134–144)

## 2013-09-11 ENCOUNTER — Telehealth: Payer: Self-pay | Admitting: Pharmacist

## 2013-09-11 NOTE — Telephone Encounter (Signed)
Patient called about recent lab results.  A1c and lipids have improved and are at goals without starting medications.  Discussed continuing TLC with patient and recheck labs in 3-4 months.  Will mail copy of labs to patient at his request.

## 2013-12-29 ENCOUNTER — Encounter: Payer: Self-pay | Admitting: Gastroenterology

## 2014-01-05 ENCOUNTER — Other Ambulatory Visit: Payer: Self-pay | Admitting: Family Medicine

## 2014-03-04 ENCOUNTER — Other Ambulatory Visit: Payer: Self-pay | Admitting: Orthopedic Surgery

## 2014-03-04 ENCOUNTER — Encounter (HOSPITAL_BASED_OUTPATIENT_CLINIC_OR_DEPARTMENT_OTHER): Payer: Self-pay | Admitting: *Deleted

## 2014-03-04 NOTE — Progress Notes (Signed)
Was here last yr-ekg 10/14-now on htn meds-cannot come for labs Needs istat and ekg

## 2014-03-06 ENCOUNTER — Encounter (HOSPITAL_BASED_OUTPATIENT_CLINIC_OR_DEPARTMENT_OTHER)
Admission: RE | Disposition: A | Discharge status: Home / Self Care | Payer: Self-pay | Source: Ambulatory Visit | Attending: Orthopedic Surgery

## 2014-03-06 ENCOUNTER — Encounter (HOSPITAL_BASED_OUTPATIENT_CLINIC_OR_DEPARTMENT_OTHER): Payer: Self-pay | Admitting: Anesthesiology

## 2014-03-06 ENCOUNTER — Other Ambulatory Visit: Payer: Self-pay | Admitting: *Deleted

## 2014-03-06 ENCOUNTER — Ambulatory Visit (HOSPITAL_BASED_OUTPATIENT_CLINIC_OR_DEPARTMENT_OTHER)
Admission: RE | Admit: 2014-03-06 | Discharge: 2014-03-06 | Disposition: A | Discharge status: Home / Self Care | Payer: 59 | Source: Ambulatory Visit | Attending: Orthopedic Surgery | Admitting: Orthopedic Surgery

## 2014-03-06 ENCOUNTER — Ambulatory Visit (HOSPITAL_BASED_OUTPATIENT_CLINIC_OR_DEPARTMENT_OTHER): Payer: 59 | Admitting: Anesthesiology

## 2014-03-06 DIAGNOSIS — G5601 Carpal tunnel syndrome, right upper limb: Secondary | ICD-10-CM | POA: Diagnosis not present

## 2014-03-06 DIAGNOSIS — Z8051 Family history of malignant neoplasm of kidney: Secondary | ICD-10-CM | POA: Insufficient documentation

## 2014-03-06 DIAGNOSIS — I10 Essential (primary) hypertension: Secondary | ICD-10-CM | POA: Insufficient documentation

## 2014-03-06 DIAGNOSIS — Z8249 Family history of ischemic heart disease and other diseases of the circulatory system: Secondary | ICD-10-CM | POA: Insufficient documentation

## 2014-03-06 DIAGNOSIS — G8929 Other chronic pain: Secondary | ICD-10-CM | POA: Diagnosis not present

## 2014-03-06 DIAGNOSIS — Z833 Family history of diabetes mellitus: Secondary | ICD-10-CM | POA: Diagnosis not present

## 2014-03-06 HISTORY — PX: CARPAL TUNNEL RELEASE: SHX101

## 2014-03-06 LAB — POCT I-STAT, CHEM 8
BUN: 21 mg/dL (ref 6–23)
CALCIUM ION: 1.17 mmol/L (ref 1.12–1.23)
CREATININE: 1 mg/dL (ref 0.50–1.35)
Chloride: 102 mEq/L (ref 96–112)
GLUCOSE: 113 mg/dL — AB (ref 70–99)
HEMATOCRIT: 47 % (ref 39.0–52.0)
Hemoglobin: 16 g/dL (ref 13.0–17.0)
Potassium: 3.9 mEq/L (ref 3.7–5.3)
Sodium: 138 mEq/L (ref 137–147)
TCO2: 24 mmol/L (ref 0–100)

## 2014-03-06 SURGERY — CARPAL TUNNEL RELEASE
Anesthesia: Monitor Anesthesia Care | Site: Wrist | Laterality: Right

## 2014-03-06 MED ORDER — MIDAZOLAM HCL 2 MG/2ML IJ SOLN: 1.0000 mg | INTRAMUSCULAR | Status: DC | PRN

## 2014-03-06 MED ORDER — LISINOPRIL-HYDROCHLOROTHIAZIDE 20-25 MG PO TABS: 1.0000 | ORAL_TABLET | Freq: Every day | ORAL | Status: DC

## 2014-03-06 MED ORDER — BUPIVACAINE HCL (PF) 0.25 % IJ SOLN
INTRAMUSCULAR | Status: DC | PRN
  Administered 2014-03-06: 5 mL

## 2014-03-06 MED ORDER — FENTANYL CITRATE 0.05 MG/ML IJ SOLN: 50.0000 ug | INTRAMUSCULAR | Status: DC | PRN

## 2014-03-06 MED ORDER — MIDAZOLAM HCL 5 MG/5ML IJ SOLN
INTRAMUSCULAR | Status: DC | PRN
  Administered 2014-03-06: 2 mg via INTRAVENOUS

## 2014-03-06 MED ORDER — CEFAZOLIN SODIUM-DEXTROSE 2-3 GM-% IV SOLR
INTRAVENOUS | Status: AC
  Filled 2014-03-06: qty 50

## 2014-03-06 MED ORDER — LIDOCAINE HCL (PF) 0.5 % IJ SOLN
INTRAMUSCULAR | Status: DC | PRN
  Administered 2014-03-06: 30 mL via INTRAVENOUS

## 2014-03-06 MED ORDER — HYDROMORPHONE HCL 1 MG/ML IJ SOLN: 0.2500 mg | INTRAMUSCULAR | Status: DC | PRN

## 2014-03-06 MED ORDER — LACTATED RINGERS IV SOLN
INTRAVENOUS | Status: DC
  Administered 2014-03-06 (×2): via INTRAVENOUS

## 2014-03-06 MED ORDER — OXYCODONE HCL 5 MG/5ML PO SOLN: 5.0000 mg | Freq: Once | ORAL | Status: DC | PRN

## 2014-03-06 MED ORDER — CEFAZOLIN SODIUM-DEXTROSE 2-3 GM-% IV SOLR: 2.0000 g | INTRAVENOUS | Status: DC

## 2014-03-06 MED ORDER — PROPOFOL INFUSION 10 MG/ML OPTIME
INTRAVENOUS | Status: DC | PRN
  Administered 2014-03-06: 140 ug/kg/min via INTRAVENOUS

## 2014-03-06 MED ORDER — OXYCODONE HCL 5 MG PO TABS: 5.0000 mg | ORAL_TABLET | Freq: Once | ORAL | Status: DC | PRN

## 2014-03-06 MED ORDER — FENTANYL CITRATE 0.05 MG/ML IJ SOLN
INTRAMUSCULAR | Status: AC
  Filled 2014-03-06: qty 4

## 2014-03-06 MED ORDER — PROMETHAZINE HCL 25 MG/ML IJ SOLN: 6.2500 mg | INTRAMUSCULAR | Status: DC | PRN

## 2014-03-06 MED ORDER — CHLORHEXIDINE GLUCONATE 4 % EX LIQD: 60.0000 mL | Freq: Once | CUTANEOUS | Status: DC

## 2014-03-06 MED ORDER — GLYCOPYRROLATE 0.2 MG/ML IJ SOLN
INTRAMUSCULAR | Status: DC | PRN
  Administered 2014-03-06: 0.2 mg via INTRAVENOUS

## 2014-03-06 MED ORDER — ONDANSETRON HCL 4 MG/2ML IJ SOLN
INTRAMUSCULAR | Status: DC | PRN
  Administered 2014-03-06: 4 mg via INTRAVENOUS

## 2014-03-06 MED ORDER — MIDAZOLAM HCL 2 MG/2ML IJ SOLN
INTRAMUSCULAR | Status: AC
  Filled 2014-03-06: qty 2

## 2014-03-06 MED ORDER — FENTANYL CITRATE 0.05 MG/ML IJ SOLN
INTRAMUSCULAR | Status: DC | PRN
  Administered 2014-03-06: 100 ug via INTRAVENOUS

## 2014-03-06 MED ORDER — CEFAZOLIN SODIUM-DEXTROSE 2-3 GM-% IV SOLR
2.0000 g | INTRAVENOUS | Status: AC
  Administered 2014-03-06: 2 g via INTRAVENOUS

## 2014-03-06 MED ORDER — HYDROCODONE-ACETAMINOPHEN 10-325 MG PO TABS: 1.0000 | ORAL_TABLET | Freq: Four times a day (QID) | ORAL | Status: DC | PRN

## 2014-03-06 SURGICAL SUPPLY — 38 items
BENZOIN TINCTURE PRP APPL 2/3 (GAUZE/BANDAGES/DRESSINGS) ×3 IMPLANT
BLADE SURG 15 STRL LF DISP TIS (BLADE) ×1 IMPLANT
BLADE SURG 15 STRL SS (BLADE) ×2
BNDG COHESIVE 3X5 TAN STRL LF (GAUZE/BANDAGES/DRESSINGS) ×3 IMPLANT
BNDG ESMARK 4X9 LF (GAUZE/BANDAGES/DRESSINGS) IMPLANT
BNDG GAUZE ELAST 4 BULKY (GAUZE/BANDAGES/DRESSINGS) ×3 IMPLANT
CHLORAPREP W/TINT 26ML (MISCELLANEOUS) ×3 IMPLANT
CLOSURE STERI-STRIP 1/2X4 (GAUZE/BANDAGES/DRESSINGS) ×1
CLSR STERI-STRIP ANTIMIC 1/2X4 (GAUZE/BANDAGES/DRESSINGS) ×2 IMPLANT
CORDS BIPOLAR (ELECTRODE) ×3 IMPLANT
COVER BACK TABLE 60X90IN (DRAPES) ×3 IMPLANT
COVER MAYO STAND STRL (DRAPES) ×3 IMPLANT
CUFF TOURNIQUET SINGLE 18IN (TOURNIQUET CUFF) ×3 IMPLANT
DRAPE EXTREMITY T 121X128X90 (DRAPE) ×3 IMPLANT
DRAPE SURG 17X23 STRL (DRAPES) ×3 IMPLANT
DRSG PAD ABDOMINAL 8X10 ST (GAUZE/BANDAGES/DRESSINGS) ×3 IMPLANT
GAUZE SPONGE 4X4 12PLY STRL (GAUZE/BANDAGES/DRESSINGS) ×3 IMPLANT
GAUZE XEROFORM 1X8 LF (GAUZE/BANDAGES/DRESSINGS) ×3 IMPLANT
GLOVE BIOGEL PI IND STRL 7.0 (GLOVE) ×1 IMPLANT
GLOVE BIOGEL PI IND STRL 8.5 (GLOVE) ×1 IMPLANT
GLOVE BIOGEL PI INDICATOR 7.0 (GLOVE) ×2
GLOVE BIOGEL PI INDICATOR 8.5 (GLOVE) ×2
GLOVE ECLIPSE 6.5 STRL STRAW (GLOVE) ×3 IMPLANT
GLOVE SURG ORTHO 8.0 STRL STRW (GLOVE) ×3 IMPLANT
GOWN STRL REUS W/ TWL LRG LVL3 (GOWN DISPOSABLE) ×2 IMPLANT
GOWN STRL REUS W/TWL LRG LVL3 (GOWN DISPOSABLE) ×4
GOWN STRL REUS W/TWL XL LVL3 (GOWN DISPOSABLE) ×3 IMPLANT
NEEDLE 27GAX1X1/2 (NEEDLE) IMPLANT
NS IRRIG 1000ML POUR BTL (IV SOLUTION) ×3 IMPLANT
PACK BASIN DAY SURGERY FS (CUSTOM PROCEDURE TRAY) ×3 IMPLANT
STOCKINETTE 4X48 STRL (DRAPES) ×3 IMPLANT
SUT CHROMIC 4 0 P 3 18 (SUTURE) ×3 IMPLANT
SUT VICRYL 4-0 PS2 18IN ABS (SUTURE) IMPLANT
SUT VICRYL RAPIDE 4/0 PS 2 (SUTURE) IMPLANT
SYR BULB 3OZ (MISCELLANEOUS) ×3 IMPLANT
SYR CONTROL 10ML LL (SYRINGE) IMPLANT
TOWEL OR 17X24 6PK STRL BLUE (TOWEL DISPOSABLE) ×3 IMPLANT
UNDERPAD 30X30 INCONTINENT (UNDERPADS AND DIAPERS) ×3 IMPLANT

## 2014-03-06 NOTE — Brief Op Note (Signed)
03/06/2014  12:23 PM  PATIENT:  [name]  48 y.o. male  PRE-OPERATIVE DIAGNOSIS:  RIGHT CARPAL TUNNEL SYNDROM  POST-OPERATIVE DIAGNOSIS:  right carpal tunnel syndrome  PROCEDURE:  Procedure(s): RIGHT CARPAL TUNNEL RELEASE (Right)  SURGEON:  Surgeon(s) and Role:    * Daryll Brod, MD - Primary  PHYSICIAN ASSISTANT:   ASSISTANTS: none   ANESTHESIA:   local and regional  EBL:  Total I/O In: 1000 [I.V.:1000] Out: -   BLOOD ADMINISTERED:none  DRAINS: none   LOCAL MEDICATIONS USED:  BUPIVICAINE   SPECIMEN:  No Specimen  DISPOSITION OF SPECIMEN:  N/A  COUNTS:  YES  TOURNIQUET:   Total Tourniquet Time Documented: Forearm (Right) - 26 minutes Total: Forearm (Right) - 26 minutes   DICTATION: .Other Dictation: Dictation Number [id number]  PLAN OF CARE: Discharge to home after PACU  PATIENT DISPOSITION:  PACU - hemodynamically stable.

## 2014-03-06 NOTE — H&P (Signed)
[name] [name] is a 48 year-old right-hand dominant male complaining of numbness and tingling, coldness of his right hand. He does have positive nerve conductions done a year ago revealing carpal tunnel syndrome bilaterally with a motor delay of 3.9 on the left, 4.2 on the right.  Sensory delay of 2.5 left and 2.7 right.  Amplitude diminution to 35 on the left and 37 right.  He is also complaining of some discomfort in his elbow left side.  He has history of biceps rupture, repaired by Dr. Daylene Katayama many years ago.  He has had at least two injections to the carpal canal on his right side, but this has given him temporary relief and has returned.  He has no history of injury to the hand or neck.  He is not awakened at night.  He has been wearing a splint.  He is taking Tylenol for pain.  He complains of intermittent, moderate sharp pain with numbness and tingling median nerve distribution.  He states it is gradually getting worse.  Activity and exercise make this worse.  He has been taking Tylenol and ibuprofen.   He has no history of diabetes.  ALLERGIES:    None.  MEDICATIONS:     Lisinopril, Tylenol, multivitamins, flaxseed, vitamin C.  SURGICAL HISTORY:    Shoulder surgery (x3), right biceps repair, cyst in right wrist excised.  FAMILY MEDICAL HISTORY:    Positive for diabetes, heart disease, high blood pressure.  SOCIAL HISTORY:     He does not smoke or drink.  He is divorced.  Works as a English as a second language teacher.  REVIEW OF SYSTEMS:   Negative 14 points.  [name] [name] is an 48 y.o. male.   Chief Complaint: Right carpal tunnel syndrome HPI: see above  Past Medical History  Diagnosis Date  . Chronic pain   . Hypertension     no meds  . Dental crown present     Past Surgical History  Procedure Laterality Date  . Right shoulder surgery  2010  . Right bicep surgery  2008    Radial nerve and cyst removal-bicep rupture  . Ganglion cyst excision  2009    rt wrist  . Shoulder arthroscopy  98,09    right  .  Colonoscopy    . Ganglion cyst excision Left 01/17/2013    Procedure: EXCISION VOLAR MYXOID CYST LEFT WRIST;  Surgeon: Cammie Sickle., MD;  Location: [place];  Service: Orthopedics;  Laterality: Left;    Family History  Problem Relation Age of Onset  . Heart disease Mother   . Diabetes Mother   . Cancer Father     kidney   Social History:  reports that he has never smoked. He has never used smokeless tobacco. He reports that he does not drink alcohol or use illicit drugs.  Allergies: No Known Allergies  Medications Prior to Admission  Medication Sig Dispense Refill  . acetaminophen (TYLENOL) 500 MG tablet Take 500 mg by mouth every 6 (six) hours as needed.    . cholecalciferol (VITAMIN D) 1000 UNITS tablet Take 1,000 Units by mouth daily. 5000    . diclofenac (VOLTAREN) 75 MG EC tablet     . fish oil-omega-3 fatty acids 1000 MG capsule Take 2 g by mouth daily.    Marland Kitchen glucose blood test strip Use to check BG daily 100 each 3  . lisinopril-hydrochlorothiazide (PRINZIDE,ZESTORETIC) 20-25 MG per tablet Take 1 tablet by mouth daily. 90 tablet 0  . Multiple Vitamins-Minerals (MULTIVITAMIN  WITH MINERALS) tablet Take 1 tablet by mouth daily.    [name] DELICA LANCETS 15A MISC Use to check BG daily 100 each 3    No results found for this or any previous visit (from the past 48 hour(s)).  No results found.   Pertinent items are noted in HPI.  Height 5\' 10"  (1.778 m), weight 83.915 kg (185 lb).  General appearance: alert, cooperative and appears stated age Head: Normocephalic, without obvious abnormality, asymmetric shape, atraumatic Neck: no JVD Resp: clear to auscultation bilaterally Cardio: regular rate and rhythm, S1, S2 normal, no murmur, click, rub or gallop GI: soft, non-tender; bowel sounds normal; no masses,  no organomegaly Extremities: numbness right hand Pulses: 2+ and symmetric Skin: Skin color, texture, turgor normal. No rashes or  lesions Neurologic: Grossly normal Incision/Wound: na  Assessment/Plan Diagnosis: right carpal tunnel syndrome RECOMMENDATIONS/PLAN:    He would like to proceed to have his right carpal tunnel released. The pre, peri and postoperative course were discussed along with the risks and complications.  The patient is aware there is no guarantee with the surgery, possibility of infection, recurrence, injury to arteries, nerves, tendons, incomplete relief of symptoms and dystrophy.  He would like to proceed.  Enriqueta Augusta R 03/06/2014, 11:06 AM

## 2014-03-06 NOTE — Discharge Instructions (Addendum)

## 2014-03-06 NOTE — Transfer of Care (Signed)
Immediate Anesthesia Transfer of Care Note  Patient: [name] [name]  Procedure(s) Performed: Procedure(s): RIGHT CARPAL TUNNEL RELEASE (Right)  Patient Location: PACU  Anesthesia Type:Bier block  Level of Consciousness: awake and alert   Airway & Oxygen Therapy: Patient Spontanous Breathing and Patient connected to face mask oxygen  Post-op Assessment: Report given to PACU RN and Post -op Vital signs reviewed and stable  Post vital signs: Reviewed and stable  Complications: No apparent anesthesia complications

## 2014-03-06 NOTE — Anesthesia Postprocedure Evaluation (Signed)
Anesthesia Post Note  Patient: [name] [name]  Procedure(s) Performed: Procedure(s) (LRB): RIGHT CARPAL TUNNEL RELEASE (Right)  Anesthesia type: MAC  Patient location: PACU  Post pain: Pain level controlled  Post assessment: Patient's Cardiovascular Status Stable  Last Vitals:  Filed Vitals:   03/06/14 1315  BP: 119/58  Pulse: 63  Temp:   Resp: 17    Post vital signs: Reviewed and stable  Level of consciousness: sedated  Complications: No apparent anesthesia complications

## 2014-03-06 NOTE — Anesthesia Preprocedure Evaluation (Addendum)
Anesthesia Evaluation  Patient identified by MRN, date of birth, ID band  Reviewed: Allergy & Precautions, H&P , NPO status , Patient's Chart, lab work & pertinent test results  Airway Mallampati: II  TM Distance: >3 FB Neck ROM: Full    Dental  (+) Teeth Intact   Pulmonary neg pulmonary ROS,          Cardiovascular hypertension, Pt. on medications     Neuro/Psych negative neurological ROS  negative psych ROS   GI/Hepatic negative GI ROS, Neg liver ROS,   Endo/Other  Pre-diabetes  Renal/GU negative Renal ROS     Musculoskeletal negative musculoskeletal ROS (+)   Abdominal   Peds  Hematology negative hematology ROS (+)   Anesthesia Other Findings   Reproductive/Obstetrics                            Anesthesia Physical Anesthesia Plan  ASA: II  Anesthesia Plan: MAC and Bier Block   Post-op Pain Management:    Induction: Intravenous  Airway Management Planned: Natural Airway and Simple Face Mask  Additional Equipment:   Intra-op Plan:   Post-operative Plan:   Informed Consent: I have reviewed the patients History and Physical, chart, labs and discussed the procedure including the risks, benefits and alternatives for the proposed anesthesia with the patient or authorized representative who has indicated his/her understanding and acceptance.     Plan Discussed with: CRNA and Surgeon  Anesthesia Plan Comments:         Anesthesia Quick Evaluation

## 2014-03-06 NOTE — Op Note (Signed)
[name] [name], [name] [name]                  ACCOUNT NO.:  [id number]  MEDICAL RECORD NO.:  [id number]  LOCATION:                                 FACILITY:  PHYSICIAN:  Daryll Brod, M.D.            DATE OF BIRTH:  DATE OF PROCEDURE:  03/06/2014 DATE OF DISCHARGE:                              OPERATIVE REPORT   PREOPERATIVE DIAGNOSIS:  Carpal tunnel syndrome, right hand.  POSTOPERATIVE DIAGNOSIS:  Carpal tunnel syndrome, right hand.  OPERATION:  Decompression right median nerve.  SURGEON:  Daryll Brod, MD  ANESTHESIA:  Forearm-based IV regional with local infiltration.  ANESTHESIOLOGIST:  Soledad Gerlach, MD  HISTORY:  The patient is a 48 year old male with a history of carpal tunnel syndrome.  Nerve conduction is positive not responsive to conservative treatment.  He has elected to undergo surgical decompression.  Pre, peri, and postoperative course have been discussed along with risks and complications.  He is aware that there is no guarantee with the surgery, possibility of infection, recurrence of injury to arteries, nerves, tendons, incomplete relief of symptoms, and dystrophy.  In the preoperative area, the patient was seen, the extremity marked by both patient and surgeon.  Antibiotic given.  PROCEDURE IN DETAIL:  The patient was brought to the operating room, where a forearm-based IV regional anesthetic was carried out without difficulty.  He was prepped using ChloraPrep, supine position with the right arm free.  A 3-minute dry time was allowed.  Time-out taken confirming the patient and procedure.  A longitudinal incision was made in the right palm, carried down through subcutaneous tissue.  Bleeders were electrocauterized.  Palmar fascia was split.  Superficial palmar arch identified.  Flexor tendon to the ring and little finger identified.  To the ulnar side of the median nerve, carpal retinaculum was incised with sharp dissection.  Right angle and Sewall  retractor were placed between skin and forearm fascia.  Fascia was released for approximately a centimeter and half proximal to the wrist crease under direct vision.  Canal was explored.  No further lesions were identified. Air compression of the nerve was apparent.  The motor branch entered in the muscle.  The wound was copiously irrigated with saline.  The skin closed with subcuticular 4-0 chromic suture.  Steri-Strips were applied over benzoin. A sterile compressive dressing was applied.  On deflation of the tourniquet, all fingers immediately pinked.  He was taken to the recovery room for observation in satisfactory condition.  He will be discharged home to return to the [place] in 1 week on Vicodin.          ______________________________ Daryll Brod, M.D.     GK/MEDQ  D:  03/06/2014  T:  03/06/2014  Job:  [id number]

## 2014-03-06 NOTE — Op Note (Signed)
  Dictation Number [id number]

## 2014-03-10 ENCOUNTER — Encounter (HOSPITAL_BASED_OUTPATIENT_CLINIC_OR_DEPARTMENT_OTHER): Payer: Self-pay | Admitting: Orthopedic Surgery

## 2014-05-08 ENCOUNTER — Ambulatory Visit (INDEPENDENT_AMBULATORY_CARE_PROVIDER_SITE_OTHER): Payer: 59 | Admitting: Family Medicine

## 2014-05-08 ENCOUNTER — Encounter: Payer: Self-pay | Admitting: Family Medicine

## 2014-05-08 VITALS — BP 162/90 | HR 61 | Temp 97.8°F | Wt 193.4 lb

## 2014-05-08 DIAGNOSIS — R300 Dysuria: Secondary | ICD-10-CM

## 2014-05-08 LAB — POCT URINALYSIS DIPSTICK
Bilirubin, UA: NEGATIVE
Blood, UA: NEGATIVE
Glucose, UA: NEGATIVE
Ketones, UA: NEGATIVE
Leukocytes, UA: NEGATIVE
Nitrite, UA: NEGATIVE
Protein, UA: NEGATIVE
Spec Grav, UA: 1.01
Urobilinogen, UA: NEGATIVE
pH, UA: 6.5

## 2014-05-08 LAB — POCT UA - MICROSCOPIC ONLY
Bacteria, U Microscopic: NEGATIVE
Casts, Ur, LPF, POC: NEGATIVE
Mucus, UA: NEGATIVE
RBC, urine, microscopic: NEGATIVE
WBC, Ur, HPF, POC: NEGATIVE
Yeast, UA: NEGATIVE

## 2014-05-08 MED ORDER — CIPROFLOXACIN HCL 500 MG PO TABS: 500.0000 mg | ORAL_TABLET | Freq: Two times a day (BID) | ORAL | Status: DC

## 2014-05-08 NOTE — Progress Notes (Signed)
   Subjective:    Patient ID: [name] [name], male    DOB: Dec 29, 1965, 49 y.o.   MRN: [id number]  HPI Patient is here with c/o urinary sx's.  He request to be checked for [facility] and chlamydia.  Review of Systems  Constitutional: Negative for fever.  HENT: Negative for ear pain.   Eyes: Negative for discharge.  Respiratory: Negative for cough.   Cardiovascular: Negative for chest pain.  Gastrointestinal: Negative for abdominal distention.  Endocrine: Negative for polyuria.  Genitourinary: Negative for difficulty urinating.  Musculoskeletal: Negative for gait problem and neck pain.  Skin: Negative for color change and rash.  Neurological: Negative for speech difficulty and headaches.  Psychiatric/Behavioral: Negative for agitation.       Objective:    BP 162/90 mmHg  Pulse 61  Temp(Src) 97.8 F (36.6 C) (Oral)  Wt 193 lb 6.4 oz (87.726 kg) Physical Exam  Constitutional: He is oriented to person, place, and time. He appears well-developed and well-nourished.  HENT:  Head: Normocephalic and atraumatic.  Mouth/Throat: Oropharynx is clear and moist.  Eyes: Pupils are equal, round, and reactive to light.  Neck: Normal range of motion. Neck supple.  Cardiovascular: Normal rate and regular rhythm.   No murmur heard. Pulmonary/Chest: Effort normal and breath sounds normal.  Abdominal: Soft. Bowel sounds are normal. There is no tenderness.  Neurological: He is alert and oriented to person, place, and time.  Skin: Skin is warm and dry.  Psychiatric: He has a normal mood and affect.          Assessment & Plan:     ICD-9-CM ICD-10-CM   1. Dysuria 788.1 R30.0 POCT UA - Microscopic Only     POCT urinalysis dipstick     ciprofloxacin (CIPRO) 500 MG tablet     No Follow-up on file.  Lysbeth Penner FNP

## 2014-05-10 LAB — URINE CULTURE: Organism ID, Bacteria: NO GROWTH

## 2014-05-13 LAB — GC/CHLAMYDIA PROBE AMP
Chlamydia trachomatis, NAA: NEGATIVE
Neisseria gonorrhoeae by PCR: NEGATIVE

## 2014-05-15 ENCOUNTER — Telehealth: Payer: Self-pay | Admitting: Family Medicine

## 2014-05-15 NOTE — Telephone Encounter (Signed)
Advised patient of negative results. Patient continues to have some lower abd discomfort. He is still taking the antibiotic. What should he do if this doesn't resolve?

## 2014-05-16 NOTE — Telephone Encounter (Signed)
Patient has a follow up appointment scheduled. He will keep this appointment and follow up sooner if symptoms worsen.

## 2014-05-16 NOTE — Telephone Encounter (Signed)
Follow up if not better.

## 2014-05-30 ENCOUNTER — Ambulatory Visit (INDEPENDENT_AMBULATORY_CARE_PROVIDER_SITE_OTHER): Payer: 59 | Admitting: Family

## 2014-05-30 ENCOUNTER — Encounter: Payer: Self-pay | Admitting: Family

## 2014-05-30 ENCOUNTER — Encounter (INDEPENDENT_AMBULATORY_CARE_PROVIDER_SITE_OTHER): Payer: Self-pay

## 2014-05-30 VITALS — BP 125/76 | HR 59 | Temp 97.4°F | Ht 70.0 in | Wt 190.0 lb

## 2014-05-30 DIAGNOSIS — Z09 Encounter for follow-up examination after completed treatment for conditions other than malignant neoplasm: Secondary | ICD-10-CM

## 2014-05-30 DIAGNOSIS — N419 Inflammatory disease of prostate, unspecified: Secondary | ICD-10-CM

## 2014-05-30 NOTE — Patient Instructions (Signed)

## 2014-05-30 NOTE — Progress Notes (Signed)
   Subjective:    Patient ID: [name] [name], male    DOB: [birth year], 49 y.o.   MRN: [id number]  HPI Pt presents to the office to recheck right lower back pain and abd pain. Pt states he had this in the past and it was a prostate infection. Pt had urine culture and GC and chlamydia that were all negative. Pt states he was given cipro 500 mg BID for 3 weeks. Pt states his back pain and abd pain is gone. Pt states he still has a few more days left. Pt denies any blood in his urine, palpitations, SOB, or edema at this time.     Review of Systems  Constitutional: Negative.   HENT: Negative.   Respiratory: Negative.   Cardiovascular: Negative.   Gastrointestinal: Negative.   Endocrine: Negative.   Genitourinary: Negative.   Musculoskeletal: Negative.   Neurological: Negative.   Hematological: Negative.   Psychiatric/Behavioral: Negative.   All other systems reviewed and are negative.      Objective:   Physical Exam  Constitutional: He is oriented to person, place, and time. He appears well-developed and well-nourished. No distress.  HENT:  Head: Normocephalic.  Right Ear: External ear normal.  Left Ear: External ear normal.  Nose: Nose normal.  Mouth/Throat: Oropharynx is clear and moist.  Eyes: Pupils are equal, round, and reactive to light. Right eye exhibits no discharge. Left eye exhibits no discharge.  Neck: Normal range of motion. Neck supple. No thyromegaly present.  Cardiovascular: Normal rate, regular rhythm, normal heart sounds and intact distal pulses.   No murmur heard. Pulmonary/Chest: Effort normal and breath sounds normal. No respiratory distress. He has no wheezes.  Abdominal: Soft. Bowel sounds are normal. He exhibits no distension. There is no tenderness.  Musculoskeletal: Normal range of motion. He exhibits no edema or tenderness.  Neurological: He is alert and oriented to person, place, and time. He has normal reflexes. No cranial nerve deficit.  Skin: Skin is  warm and dry. No rash noted. No erythema.  Psychiatric: He has a normal mood and affect. His behavior is normal. Judgment and thought content normal.  Vitals reviewed.   BP 125/76 mmHg  Pulse 59  Temp(Src) 97.4 F (36.3 C) (Oral)  Ht 5\' 10"  (1.778 m)  Wt 190 lb (86.183 kg)  BMI 27.26 kg/m2       Assessment & Plan:  1. Follow-up exam  2. Prostate infection -Finish coarse of antibiotics  -Sitz baths if helps -RTO prn  Evelina Dun, FNP

## 2014-06-05 ENCOUNTER — Ambulatory Visit (INDEPENDENT_AMBULATORY_CARE_PROVIDER_SITE_OTHER): Payer: 59 | Admitting: Family Medicine

## 2014-06-05 ENCOUNTER — Encounter: Payer: Self-pay | Admitting: Family Medicine

## 2014-06-05 VITALS — BP 127/77 | HR 69 | Temp 97.0°F | Ht 70.0 in | Wt 189.0 lb

## 2014-06-05 DIAGNOSIS — E785 Hyperlipidemia, unspecified: Secondary | ICD-10-CM | POA: Diagnosis not present

## 2014-06-05 DIAGNOSIS — E1169 Type 2 diabetes mellitus with other specified complication: Secondary | ICD-10-CM | POA: Diagnosis not present

## 2014-06-05 DIAGNOSIS — E559 Vitamin D deficiency, unspecified: Secondary | ICD-10-CM

## 2014-06-05 DIAGNOSIS — Z0000 Encounter for general adult medical examination without abnormal findings: Secondary | ICD-10-CM

## 2014-06-05 DIAGNOSIS — E118 Type 2 diabetes mellitus with unspecified complications: Secondary | ICD-10-CM | POA: Diagnosis not present

## 2014-06-05 DIAGNOSIS — I10 Essential (primary) hypertension: Secondary | ICD-10-CM

## 2014-06-05 LAB — POCT CBC
GRANULOCYTE PERCENT: 59.1 % (ref 37–80)
HEMATOCRIT: 46.7 % (ref 43.5–53.7)
HEMOGLOBIN: 14.2 g/dL (ref 14.1–18.1)
Lymph, poc: 2.1 (ref 0.6–3.4)
MCH: 26.9 pg — AB (ref 27–31.2)
MCHC: 30.4 g/dL — AB (ref 31.8–35.4)
MCV: 88.4 fL (ref 80–97)
MPV: 7.8 fL (ref 0–99.8)
PLATELET COUNT, POC: 389 10*3/uL (ref 142–424)
POC Granulocyte: 3.5 (ref 2–6.9)
POC LYMPH PERCENT: 36 %L (ref 10–50)
RBC: 5.29 M/uL (ref 4.69–6.13)
RDW, POC: 12.6 %
WBC: 5.9 10*3/uL (ref 4.6–10.2)

## 2014-06-05 LAB — POCT URINALYSIS DIPSTICK
BILIRUBIN UA: NEGATIVE
Blood, UA: NEGATIVE
Glucose, UA: NEGATIVE
KETONES UA: NEGATIVE
LEUKOCYTES UA: NEGATIVE
NITRITE UA: NEGATIVE
PH UA: 6
Protein, UA: NEGATIVE
Spec Grav, UA: 1.02
UROBILINOGEN UA: NEGATIVE

## 2014-06-05 LAB — POCT UA - MICROSCOPIC ONLY
Casts, Ur, LPF, POC: NEGATIVE
Crystals, Ur, HPF, POC: NEGATIVE
Epithelial cells, urine per micros: NEGATIVE
Mucus, UA: NEGATIVE
RBC, urine, microscopic: NEGATIVE
WBC, UR, HPF, POC: NEGATIVE
YEAST UA: NEGATIVE

## 2014-06-05 LAB — POCT GLYCOSYLATED HEMOGLOBIN (HGB A1C): Hemoglobin A1C: 5.8

## 2014-06-05 NOTE — Patient Instructions (Addendum)
Continue current medications. Continue good therapeutic lifestyle changes which include good diet and exercise. Fall precautions discussed with patient. If an FOBT was given today- please return it to our front desk. If you are over 49 years old - you may need Prevnar 40 or the adult Pneumonia vaccine.  Flu Shots are still available at our office. If you still haven't had one please call to set up a nurse visit to get one.   After your visit with [place] today you will receive a survey in the mail or online from [employer] regarding your care with [place]. Please take a moment to fill this out. Your feedback is very important to [place] as you can help [place] better understand your patient needs as well as improve your experience and satisfaction. WE CARE ABOUT YOU!!!   Please remember that at age 71 you will need to get a Prevnar vaccine, and shingles shot, and a colonoscopy. These are routine things that happen when you turn 50. He should also do yearly Hemoccults and PSAs.  Continue with healthy eating habits and monitoring blood sugars regularly.

## 2014-06-05 NOTE — Addendum Note (Signed)
Addended by: Earlene Plater on: 06/05/2014 02:37 PM   Modules accepted: Orders

## 2014-06-05 NOTE — Progress Notes (Addendum)
Subjective:    Patient ID: [name] [name], male    DOB: [birth year], 49 y.o.   MRN: [id number]  HPI Patient is here today for annual wellness exam and follow up of chronic medical problems which includes hypertension. He is taking medications regularly. The patient is doing well. Recently had a urinary tract infection or prostate infection with low back and groin pain and took a course of antibiotics for about 3 weeks and this has improved. He checks his blood sugars at home and they have been running anywhere around 100 fasting anywhere from between 100 and 120 during the day. He did not bring any readings in for review. On review of systems he denies any problems with chest pain chest tightness shortness of breath trouble swallowing blood in the stool black tarry bowel movements or joint pain other than regular arthralgias that he has ongoing. He is not taking any medications and is controlling his blood sugar with his diet. The family history is positive for renal cell carcinoma with metastasis in his dad and diabetes on his mother's side        Patient Active Problem List   Diagnosis Date Noted  . Type 2 diabetes mellitus 07/01/2013  . Dyslipidemia associated with type 2 diabetes mellitus 07/01/2013  . Vitamin D deficiency 06/03/2013  . Chronic pain   . Hypertension   . RECTAL BLEEDING 01/22/2008   Outpatient Encounter Prescriptions as of 06/05/2014  Medication Sig  . acetaminophen (TYLENOL) 500 MG tablet Take 500 mg by mouth every 6 (six) hours as needed.  . cholecalciferol (VITAMIN D) 1000 UNITS tablet Take 1,000 Units by mouth daily. 5000  . Flaxseed, Linseed, (FLAX SEEDS PO) Take by mouth.  Marland Kitchen glucose blood test strip Use to check BG daily  . lisinopril-hydrochlorothiazide (PRINZIDE,ZESTORETIC) 20-25 MG per tablet Take 1 tablet by mouth daily.  . Multiple Vitamins-Minerals (MULTIVITAMIN WITH MINERALS) tablet Take 1 tablet by mouth daily.  [name] DELICA LANCETS 93G MISC Use to  check BG daily  . [DISCONTINUED] HYDROcodone-acetaminophen (NORCO) 10-325 MG per tablet Take 1 tablet by mouth every 6 (six) hours as needed. (Patient not taking: Reported on 05/30/2014)    Review of Systems  Constitutional: Negative.   HENT: Negative.   Eyes: Negative.   Respiratory: Negative.   Cardiovascular: Negative.   Gastrointestinal: Negative.   Endocrine: Negative.   Genitourinary: Negative.   Musculoskeletal: Negative.   Skin: Negative.   Allergic/Immunologic: Negative.   Neurological: Negative.   Hematological: Negative.   Psychiatric/Behavioral: Negative.        Objective:   Physical Exam  Constitutional: He is oriented to person, place, and time. He appears well-developed and well-nourished.  HENT:  Head: Normocephalic and atraumatic.  Right Ear: External ear normal.  Left Ear: External ear normal.  Nose: Nose normal.  Mouth/Throat: Oropharynx is clear and moist. No oropharyngeal exudate.  Eyes: Conjunctivae and EOM are normal. Pupils are equal, round, and reactive to light. Right eye exhibits no discharge. Left eye exhibits no discharge. No scleral icterus.  Neck: Normal range of motion. Neck supple. No thyromegaly present.  Cardiovascular: Normal rate, regular rhythm, normal heart sounds and intact distal pulses.  Exam reveals no gallop and no friction rub.   No murmur heard. At 72/m  Pulmonary/Chest: Effort normal and breath sounds normal. No respiratory distress. He has no wheezes. He has no rales. He exhibits no tenderness.  Abdominal: Soft. Bowel sounds are normal. He exhibits no mass. There is no tenderness. There  is no rebound and no guarding.  Genitourinary: Rectum normal, prostate normal and penis normal.  There were no inguinal hernias or inguinal nodes noted. The external genitalia were normal. The rectal exam was normal and the prostate was smooth without lumps or masses.  Musculoskeletal: Normal range of motion. He exhibits no edema or tenderness.    Lymphadenopathy:    He has no cervical adenopathy.  Neurological: He is alert and oriented to person, place, and time. He has normal reflexes. No cranial nerve deficit.  Skin: Skin is warm and dry. No rash noted. No erythema. No pallor.  Psychiatric: He has a normal mood and affect. His behavior is normal. Judgment and thought content normal.  Nursing note and vitals reviewed.  BP 127/77 mmHg  Pulse 69  Temp(Src) 97 F (36.1 C) (Oral)  Ht 5' 10"  (1.778 m)  Wt 189 lb (85.73 kg)  BMI 27.12 kg/m2        Assessment & Plan:  1. Type 2 diabetes mellitus without complication -The home blood sugars have been doing well with 100 fasting and anywhere between 100 100s 20 during the day and the patient will continue to monitor his blood  sugars at home - POCT CBC - POCT glycosylated hemoglobin (Hb A1C)  2. Dyslipidemia associated with type 2 diabetes mellitus -A should should continue with aggressive management with exercise and diet for his cholesterol. - POCT CBC - NMR, lipoprofile  3. Essential hypertension -The blood pressure is well controlled with his current regimen of taking lisinopril HCTZ and he should continue to take this as doing. -He should continue to watch his sodium intake. - POCT CBC - BMP8+EGFR - Hepatic function panel  4. Vitamin D deficiency -The vitamin D has been under good control and he should continue to take vitamin D3 1000 daily pending results of lab work being done today. - POCT CBC - Vit D  25 hydroxy (rtn osteoporosis monitoring)  5. Annual physical exam -He should continue with yearly physical exams and return the Hemoccult card that was given him today - POCT UA - Microscopic Only - POCT urinalysis dipstick - POCT CBC - POCT glycosylated hemoglobin (Hb A1C) - BMP8+EGFR - NMR, lipoprofile - PSA, total and free - Vit D  25 hydroxy (rtn osteoporosis monitoring) - Hepatic function panel - POCT UA - Microalbumin  No orders of the defined types  were placed in this encounter.     Patient Instructions  Continue current medications. Continue good therapeutic lifestyle changes which include good diet and exercise. Fall precautions discussed with patient. If an FOBT was given today- please return it to our front desk. If you are over 21 years old - you may need Prevnar 49 or the adult Pneumonia vaccine.  Flu Shots are still available at our office. If you still haven't had one please call to set up a nurse visit to get one.   After your visit with [place] today you will receive a survey in the mail or online from [employer] regarding your care with [place]. Please take a moment to fill this out. Your feedback is very important to [place] as you can help [place] better understand your patient needs as well as improve your experience and satisfaction. WE CARE ABOUT YOU!!!   Please remember that at age 31 you will need to get a Prevnar vaccine, and shingles shot, and a colonoscopy. These are routine things that happen when you turn 50. He should also do yearly Hemoccults and PSAs.  Continue with healthy eating habits and monitoring blood sugars regularly.   Arrie Senate MD

## 2014-06-06 LAB — NMR, LIPOPROFILE
Cholesterol: 178 mg/dL (ref 100–199)
HDL Cholesterol by NMR: 52 mg/dL (ref >39)
HDL Particle Number: 38.2 umol/L (ref >=30.5)
LDL Particle Number: 1262 nmol/L — ABNORMAL HIGH (ref <1000)
LDL Size: 20.6 nm (ref >20.5)
LDL-C: 111 mg/dL — AB (ref 0–99)
LP-IR Score: 41 (ref <=45)
Small LDL Particle Number: 568 nmol/L — ABNORMAL HIGH (ref <=527)
Triglycerides by NMR: 76 mg/dL (ref 0–149)

## 2014-06-06 LAB — PSA, TOTAL AND FREE
PSA, Free Pct: 34.3 %
PSA, Free: 0.24 ng/mL
PSA: 0.7 ng/mL (ref 0.0–4.0)

## 2014-06-06 LAB — HEPATIC FUNCTION PANEL
ALT: 28 IU/L (ref 0–44)
AST: 20 IU/L (ref 0–40)
Albumin: 4.9 g/dL (ref 3.5–5.5)
Alkaline Phosphatase: 46 IU/L (ref 39–117)
Bilirubin Total: 0.6 mg/dL (ref 0.0–1.2)
Bilirubin, Direct: 0.16 mg/dL (ref 0.00–0.40)
Total Protein: 7.4 g/dL (ref 6.0–8.5)

## 2014-06-06 LAB — BMP8+EGFR
BUN / CREAT RATIO: 21 — AB (ref 9–20)
BUN: 21 mg/dL (ref 6–24)
CALCIUM: 9.9 mg/dL (ref 8.7–10.2)
CHLORIDE: 98 mmol/L (ref 97–108)
CO2: 25 mmol/L (ref 18–29)
Creatinine, Ser: 0.99 mg/dL (ref 0.76–1.27)
GFR calc Af Amer: 104 mL/min/{1.73_m2} (ref >59)
GFR, EST NON AFRICAN AMERICAN: 90 mL/min/{1.73_m2} (ref >59)
GLUCOSE: 113 mg/dL — AB (ref 65–99)
Potassium: 4.4 mmol/L (ref 3.5–5.2)
Sodium: 139 mmol/L (ref 134–144)

## 2014-06-06 LAB — VITAMIN D 25 HYDROXY (VIT D DEFICIENCY, FRACTURES): Vit D, 25-Hydroxy: 56.9 ng/mL (ref 30.0–100.0)

## 2014-06-12 ENCOUNTER — Encounter: Payer: Self-pay | Admitting: Family

## 2014-06-12 ENCOUNTER — Ambulatory Visit (INDEPENDENT_AMBULATORY_CARE_PROVIDER_SITE_OTHER): Payer: 59 | Admitting: Family

## 2014-06-12 VITALS — BP 137/83 | HR 67 | Temp 97.1°F | Ht 69.0 in | Wt 190.5 lb

## 2014-06-12 DIAGNOSIS — J069 Acute upper respiratory infection, unspecified: Secondary | ICD-10-CM | POA: Diagnosis not present

## 2014-06-12 DIAGNOSIS — R059 Cough, unspecified: Secondary | ICD-10-CM

## 2014-06-12 DIAGNOSIS — R05 Cough: Secondary | ICD-10-CM

## 2014-06-12 MED ORDER — ROSUVASTATIN CALCIUM 5 MG PO TABS: 5.0000 mg | ORAL_TABLET | Freq: Every day | ORAL | Status: DC

## 2014-06-12 MED ORDER — AZITHROMYCIN 250 MG PO TABS: ORAL_TABLET | ORAL | Status: DC

## 2014-06-12 MED ORDER — BENZONATATE 200 MG PO CAPS: 200.0000 mg | ORAL_CAPSULE | Freq: Three times a day (TID) | ORAL | Status: DC | PRN

## 2014-06-12 NOTE — Addendum Note (Signed)
Addended by: Marylin Crosby on: 06/12/2014 11:50 AM   Modules accepted: Orders

## 2014-06-12 NOTE — Progress Notes (Signed)
Subjective:    Patient ID: [name] [name], male    DOB: 09-Nov-1965, 49 y.o.   MRN: [id number]  Sore Throat  This is a new problem. The current episode started in the past 7 days (Saturday). The problem has been gradually worsening. The maximum temperature recorded prior to his arrival was 101 - 101.9 F. The pain is at a severity of 10/10. Associated symptoms include congestion, coughing, ear pain, headaches, shortness of breath and trouble swallowing. Pertinent negatives include no diarrhea, ear discharge or vomiting. He has tried gargles and NSAIDs for the symptoms. The treatment provided mild relief.      Review of Systems  Constitutional: Negative.   HENT: Positive for congestion, ear pain and trouble swallowing. Negative for ear discharge.   Respiratory: Positive for cough and shortness of breath.   Cardiovascular: Negative.   Gastrointestinal: Negative.  Negative for vomiting and diarrhea.  Endocrine: Negative.   Genitourinary: Negative.   Musculoskeletal: Negative.   Neurological: Positive for headaches.  Hematological: Negative.   Psychiatric/Behavioral: Negative.   All other systems reviewed and are negative.      Objective:   Physical Exam  Constitutional: He is oriented to person, place, and time. He appears well-developed and well-nourished. No distress.  HENT:  Head: Normocephalic.  Right Ear: External ear normal.  Left Ear: External ear normal.  Mouth/Throat: Oropharynx is clear and moist.  Nasal passage erythemas with mild swelling  Oropharynx erythemas   Eyes: Pupils are equal, round, and reactive to light. Right eye exhibits no discharge. Left eye exhibits no discharge.  Neck: Normal range of motion. Neck supple. No thyromegaly present.  Cardiovascular: Normal rate, regular rhythm, normal heart sounds and intact distal pulses.   No murmur heard. Pulmonary/Chest: Effort normal and breath sounds normal. No respiratory distress. He has no wheezes.  Abdominal:  Soft. Bowel sounds are normal. He exhibits no distension. There is no tenderness.  Musculoskeletal: Normal range of motion. He exhibits no edema or tenderness.  Neurological: He is alert and oriented to person, place, and time. He has normal reflexes. No cranial nerve deficit.  Skin: Skin is warm and dry. No rash noted. No erythema.  Psychiatric: He has a normal mood and affect. His behavior is normal. Judgment and thought content normal.  Vitals reviewed.     BP 137/83 mmHg  Pulse 67  Temp(Src) 97.1 F (36.2 C) (Oral)  Ht 5\' 9"  (1.753 m)  Wt 190 lb 8 oz (86.41 kg)  BMI 28.12 kg/m2     Assessment & Plan:  1. Acute upper respiratory infection -- Take meds as prescribed - Use a cool mist humidifier  -Use saline nose sprays frequently -Saline irrigations of the nose can be very helpful if done frequently.  * 4X daily for 1 week*  * Use of a nettie pot can be helpful with this. Follow directions with this* -Force fluids -For any cough or congestion  Use plain Mucinex- regular strength or max strength is fine   * Children- consult with Pharmacist for dosing -For fever or aces or pains- take tylenol or ibuprofen appropriate for age and weight.  * for fevers greater than 101 orally you may alternate ibuprofen and tylenol every  3 hours. -Throat lozenges if help -New toothbrush in 3 days - azithromycin (ZITHROMAX) 250 MG tablet; Take 500 mg once, then 250 mg for four days  Dispense: 6 tablet; Refill: 0  2. Cough - azithromycin (ZITHROMAX) 250 MG tablet; Take 500 mg once, then 250  mg for four days  Dispense: 6 tablet; Refill: 0 - benzonatate (TESSALON) 200 MG capsule; Take 1 capsule (200 mg total) by mouth 3 (three) times daily as needed.  Dispense: 30 capsule; Refill: Taylor, FNP

## 2014-06-12 NOTE — Patient Instructions (Signed)
Upper Respiratory Infection, Adult An upper respiratory infection (URI) is also sometimes known as the common cold. The upper respiratory tract includes the nose, sinuses, throat, trachea, and bronchi. Bronchi are the airways leading to the lungs. Most people improve within 1 week, but symptoms can last up to 2 weeks. A residual cough may last even longer.  CAUSES Many different viruses can infect the tissues lining the upper respiratory tract. The tissues become irritated and inflamed and often become very moist. Mucus production is also common. A cold is contagious. You can easily spread the virus to others by oral contact. This includes kissing, sharing a glass, coughing, or sneezing. Touching your mouth or nose and then touching a surface, which is then touched by another person, can also spread the virus. SYMPTOMS  Symptoms typically develop 1 to 3 days after you come in contact with a cold virus. Symptoms vary from person to person. They may include:  Runny nose.  Sneezing.  Nasal congestion.  Sinus irritation.  Sore throat.  Loss of voice (laryngitis).  Cough.  Fatigue.  Muscle aches.  Loss of appetite.  Headache.  Low-grade fever. DIAGNOSIS  You might diagnose your own cold based on familiar symptoms, since most people get a cold 2 to 3 times a year. Your caregiver can confirm this based on your exam. Most importantly, your caregiver can check that your symptoms are not due to another disease such as strep throat, sinusitis, pneumonia, asthma, or epiglottitis. Blood tests, throat tests, and X-rays are not necessary to diagnose a common cold, but they may sometimes be helpful in excluding other more serious diseases. Your caregiver will decide if any further tests are required. RISKS AND COMPLICATIONS  You may be at risk for a more severe case of the common cold if you smoke cigarettes, have chronic heart disease (such as heart failure) or lung disease (such as asthma), or if  you have a weakened immune system. The very young and very old are also at risk for more serious infections. Bacterial sinusitis, middle ear infections, and bacterial pneumonia can complicate the common cold. The common cold can worsen asthma and chronic obstructive pulmonary disease (COPD). Sometimes, these complications can require emergency medical care and may be life-threatening. PREVENTION  The best way to protect against getting a cold is to practice good hygiene. Avoid oral or hand contact with people with cold symptoms. Wash your hands often if contact occurs. There is no clear evidence that vitamin C, vitamin E, echinacea, or exercise reduces the chance of developing a cold. However, it is always recommended to get plenty of rest and practice good nutrition. TREATMENT  Treatment is directed at relieving symptoms. There is no cure. Antibiotics are not effective, because the infection is caused by a virus, not by bacteria. Treatment may include:  Increased fluid intake. Sports drinks offer valuable electrolytes, sugars, and fluids.  Breathing heated mist or steam (vaporizer or shower).  Eating chicken soup or other clear broths, and maintaining good nutrition.  Getting plenty of rest.  Using gargles or lozenges for comfort.  Controlling fevers with ibuprofen or acetaminophen as directed by your caregiver.  Increasing usage of your inhaler if you have asthma. Zinc gel and zinc lozenges, taken in the first 24 hours of the common cold, can shorten the duration and lessen the severity of symptoms. Pain medicines may help with fever, muscle aches, and throat pain. A variety of non-prescription medicines are available to treat congestion and runny nose. Your caregiver   can make recommendations and may suggest nasal or lung inhalers for other symptoms.  HOME CARE INSTRUCTIONS   Only take over-the-counter or prescription medicines for pain, discomfort, or fever as directed by your  caregiver.  Use a warm mist humidifier or inhale steam from a shower to increase air moisture. This may keep secretions moist and make it easier to breathe.  Drink enough water and fluids to keep your urine clear or pale yellow.  Rest as needed.  Return to work when your temperature has returned to normal or as your caregiver advises. You may need to stay home longer to avoid infecting others. You can also use a face mask and careful hand washing to prevent spread of the virus. SEEK MEDICAL CARE IF:   After the first few days, you feel you are getting worse rather than better.  You need your caregiver's advice about medicines to control symptoms.  You develop chills, worsening shortness of breath, or brown or red sputum. These may be signs of pneumonia.  You develop yellow or brown nasal discharge or pain in the face, especially when you bend forward. These may be signs of sinusitis.  You develop a fever, swollen neck glands, pain with swallowing, or white areas in the back of your throat. These may be signs of strep throat. SEEK IMMEDIATE MEDICAL CARE IF:   You have a fever.  You develop severe or persistent headache, ear pain, sinus pain, or chest pain.  You develop wheezing, a prolonged cough, cough up blood, or have a change in your usual mucus (if you have chronic lung disease).  You develop sore muscles or a stiff neck. Document Released: 09/14/2000 Document Revised: 06/13/2011 Document Reviewed: 06/26/2013 [facility] Patient Information 2015 [place], [place]. This information is not intended to replace advice given to you by your health care provider. Make sure you discuss any questions you have with your health care provider.  - Take meds as prescribed - Use a cool mist humidifier  -Use saline nose sprays frequently -Saline irrigations of the nose can be very helpful if done frequently.  * 4X daily for 1 week*  * Use of a nettie pot can be helpful with this. Follow  directions with this* -Force fluids -For any cough or congestion  Use plain Mucinex- regular strength or max strength is fine   * Children- consult with Pharmacist for dosing -For fever or aces or pains- take tylenol or ibuprofen appropriate for age and weight.  * for fevers greater than 101 orally you may alternate ibuprofen and tylenol every  3 hours. -Throat lozenges if help -New toothbrush in 3 days   Evelina Dun, FNP

## 2014-07-08 ENCOUNTER — Other Ambulatory Visit: Payer: Self-pay | Admitting: Family Medicine

## 2014-08-28 LAB — HM DIABETES EYE EXAM

## 2014-11-25 ENCOUNTER — Encounter: Payer: Self-pay | Admitting: Podiatry

## 2014-11-25 ENCOUNTER — Ambulatory Visit (INDEPENDENT_AMBULATORY_CARE_PROVIDER_SITE_OTHER): Payer: 59

## 2014-11-25 ENCOUNTER — Ambulatory Visit (INDEPENDENT_AMBULATORY_CARE_PROVIDER_SITE_OTHER): Payer: 59 | Admitting: Podiatry

## 2014-11-25 VITALS — BP 129/78 | HR 63 | Resp 16

## 2014-11-25 DIAGNOSIS — M779 Enthesopathy, unspecified: Secondary | ICD-10-CM | POA: Diagnosis not present

## 2014-11-25 DIAGNOSIS — M205X1 Other deformities of toe(s) (acquired), right foot: Secondary | ICD-10-CM | POA: Diagnosis not present

## 2014-11-25 MED ORDER — METHYLPREDNISOLONE 4 MG PO TBPK: ORAL_TABLET | ORAL | Status: DC

## 2014-11-25 NOTE — Progress Notes (Signed)
   Subjective:    Patient ID: [name] [name], male    DOB: 1965-09-19, 49 y.o.   MRN: [id number]  HPI: He presents today with your duration of pain to the first metatarsophalangeal joint of the right foot. He states that he has some pain at least every day and depending on when he did that today or the day before will dictate the severity of pain that he has that day. He has a history of pain and trauma to the lateral aspect of the right ankle with lateral ankle instability.    Review of Systems  All other systems reviewed and are negative.      Objective:   Physical Exam: 49 year old male no acute distress vital signs stable alert and oriented 3. Pulses are strongly palpable. Neurologic sensorium is intact percent was C monofilament. Deep tendon reflexes are intact bilaterally muscle strength +5 over 5 dorsiflexion plantar flexors and inverters and everters all intrinsic musculature is intact. Orthopedic evaluation demonstrates all joints distal to the angle full range of motion without crepitation. He has pain on end range of motion dorsiflexion and plantarflexion of the first metatarsophalangeal joint. Radiographs of the right foot demonstrate bipartite tibial sesamoids with minimal joint space narrowing. These radiographs are taken in the office today. Cutaneous evaluation of a straight supple well-hydrated cutis no erythema edema saline as drainage or odor.        Assessment & Plan:  Assessment: Capsulitis first metatarsophalangeal joint right foot.  Plan: Discussed etiology pathology conservative versus surgical therapies. After sterile Betadine skin prep I injected 2 mg of dexamethasone to the first metatarsophalangeal joint of the right foot. I started him on a Medrol Dosepak and will follow-up with him in 1 month. If he is not improved at that time I recommend an MRI of the first metatarsophalangeal joint.  Roselind Messier DPM

## 2014-12-23 ENCOUNTER — Ambulatory Visit (INDEPENDENT_AMBULATORY_CARE_PROVIDER_SITE_OTHER): Payer: 59 | Admitting: Podiatry

## 2014-12-23 ENCOUNTER — Encounter: Payer: Self-pay | Admitting: Podiatry

## 2014-12-23 VITALS — BP 133/83 | HR 67 | Resp 16

## 2014-12-23 DIAGNOSIS — M722 Plantar fascial fibromatosis: Secondary | ICD-10-CM

## 2014-12-23 DIAGNOSIS — M2580 Other specified joint disorders, unspecified joint: Secondary | ICD-10-CM | POA: Diagnosis not present

## 2014-12-23 NOTE — Progress Notes (Signed)
He presents today for follow-up of his capsulitis/sesamoiditis right foot. He states that the injection really didn't seem to help too much.  Objective: Vital signs are stable he is alert and oriented 3. He has pain on palpation and range of motion of the first metatarsophalangeal joint right greater than left. Pulses are strongly palpable bilateral.  Assessment: Capsulitis and sesamoiditis right over left.  Plan: He was scanned for set of orthotics today. We'll follow-up with him once those come in.

## 2015-01-08 ENCOUNTER — Other Ambulatory Visit: Payer: Self-pay | Admitting: Family Medicine

## 2015-01-13 ENCOUNTER — Ambulatory Visit: Payer: 59 | Admitting: *Deleted

## 2015-01-13 DIAGNOSIS — M722 Plantar fascial fibromatosis: Secondary | ICD-10-CM

## 2015-01-13 NOTE — Progress Notes (Signed)
Patient ID: [name] [name], male   DOB: 09-13-1965, 50 y.o.   MRN: [id number] Patient presents for orthotic pick up.  Verbal and written break in and wear instructions given.  Patient will follow up in 4 weeks if symptoms worsen or fail to improve.

## 2015-01-13 NOTE — Patient Instructions (Signed)

## 2015-03-24 ENCOUNTER — Other Ambulatory Visit: Payer: Self-pay | Admitting: Family Medicine

## 2015-03-24 NOTE — Telephone Encounter (Signed)
Patient aware that he needs an appointment

## 2015-03-24 NOTE — Telephone Encounter (Signed)
Last seen 06/12/14  [name] [name]  Requesting 90 day supply

## 2015-03-24 NOTE — Telephone Encounter (Signed)
Pt needs follow up appt

## 2015-03-27 ENCOUNTER — Encounter: Payer: Self-pay | Admitting: Family Medicine

## 2015-03-27 ENCOUNTER — Ambulatory Visit (INDEPENDENT_AMBULATORY_CARE_PROVIDER_SITE_OTHER): Payer: 59 | Admitting: Family Medicine

## 2015-03-27 VITALS — BP 135/83 | HR 65 | Temp 97.1°F | Ht 69.0 in | Wt 200.4 lb

## 2015-03-27 DIAGNOSIS — J019 Acute sinusitis, unspecified: Secondary | ICD-10-CM

## 2015-03-27 MED ORDER — AZITHROMYCIN 250 MG PO TABS: ORAL_TABLET | ORAL | Status: DC

## 2015-03-27 MED ORDER — MOMETASONE FUROATE 50 MCG/ACT NA SUSP: 2.0000 | Freq: Every day | NASAL | Status: DC

## 2015-03-27 NOTE — Progress Notes (Signed)
BP 135/83 mmHg  Pulse 65  Temp(Src) 97.1 F (36.2 C) (Oral)  Ht 5\' 9"  (1.753 m)  Wt 200 lb 6.4 oz (90.901 kg)  BMI 29.58 kg/m2   Subjective:    Patient ID: [name] [name], male    DOB: Apr 28, 1965, 49 y.o.   MRN: [id number]  HPI: [name] [name] is a 49 y.o. male presenting on 03/27/2015 for Sinusitis and Cough   HPI Cough cold and sinus congestion Patient has been having a cough and a cold and sinus congestion and nasal pressure and nasal congestion for the past 3 days. He denies any sick contacts that he knows of. He has been using antihistamine and DayQuil and NyQuil which have been helping some but not completely. He is once to get checked out and make sure everything is good before Christmas. He denies any fevers or chills or wheezing or shortness of breath.  Relevant past medical, surgical, family and social history reviewed and updated as indicated. Interim medical history since our last visit reviewed. Allergies and medications reviewed and updated.  Review of Systems  Constitutional: Negative for fever and chills.  HENT: Positive for congestion, postnasal drip, rhinorrhea, sinus pressure and sore throat. Negative for ear discharge, ear pain, sneezing and voice change.   Eyes: Negative for pain, discharge, redness and visual disturbance.  Respiratory: Positive for cough. Negative for chest tightness, shortness of breath and wheezing.   Cardiovascular: Negative for chest pain and leg swelling.  Gastrointestinal: Negative for abdominal pain, diarrhea and constipation.  Genitourinary: Negative for difficulty urinating.  Musculoskeletal: Negative for back pain and gait problem.  Skin: Negative for rash.  Neurological: Negative for syncope, light-headedness and headaches.  All other systems reviewed and are negative.   Per HPI unless specifically indicated above     Medication List       This list is accurate as of: 03/27/15 10:33 AM.  Always use your most recent med list.                 acetaminophen 500 MG tablet  Commonly known as:  TYLENOL  Take 500 mg by mouth every 6 (six) hours as needed.     azithromycin 250 MG tablet  Commonly known as:  ZITHROMAX  Take 2 the first day and then one each day after.     cholecalciferol 1000 UNITS tablet  Commonly known as:  VITAMIN D  Take 1,000 Units by mouth daily. 5000     FLAX SEEDS PO  Take by mouth.     glucose blood test strip  Use to check BG daily     lisinopril-hydrochlorothiazide 20-25 MG tablet  Commonly known as:  PRINZIDE,ZESTORETIC  TAKE 1 TABLET BY MOUTH DAILY.     mometasone 50 MCG/ACT nasal spray  Commonly known as:  NASONEX  Place 2 sprays into the nose daily.     multivitamin with minerals tablet  Take 1 tablet by mouth daily.     ONETOUCH DELICA LANCETS 99991111 Misc  Use to check BG daily     rosuvastatin 5 MG tablet  Commonly known as:  CRESTOR  Take 1 tablet (5 mg total) by mouth daily.           Objective:    BP 135/83 mmHg  Pulse 65  Temp(Src) 97.1 F (36.2 C) (Oral)  Ht 5\' 9"  (1.753 m)  Wt 200 lb 6.4 oz (90.901 kg)  BMI 29.58 kg/m2  Wt Readings from Last 3 Encounters:  03/27/15 200  lb 6.4 oz (90.901 kg)  06/12/14 190 lb 8 oz (86.41 kg)  06/05/14 189 lb (85.73 kg)    Physical Exam  Constitutional: He is oriented to person, place, and time. He appears well-developed and well-nourished. No distress.  HENT:  Right Ear: Tympanic membrane, external ear and ear canal normal.  Left Ear: Tympanic membrane, external ear and ear canal normal.  Nose: Mucosal edema and rhinorrhea present. No sinus tenderness. No epistaxis. Right sinus exhibits maxillary sinus tenderness. Right sinus exhibits no frontal sinus tenderness. Left sinus exhibits maxillary sinus tenderness. Left sinus exhibits no frontal sinus tenderness.  Mouth/Throat: Uvula is midline and mucous membranes are normal. Posterior oropharyngeal edema and posterior oropharyngeal erythema present. No oropharyngeal  exudate or tonsillar abscesses.  Eyes: Conjunctivae and EOM are normal. Pupils are equal, round, and reactive to light. Right eye exhibits no discharge. No scleral icterus.  Neck: Neck supple. No thyromegaly present.  Cardiovascular: Normal rate, regular rhythm, normal heart sounds and intact distal pulses.   No murmur heard. Pulmonary/Chest: Effort normal and breath sounds normal. No respiratory distress. He has no wheezes.  Musculoskeletal: Normal range of motion. He exhibits no edema.  Lymphadenopathy:    He has no cervical adenopathy.  Neurological: He is alert and oriented to person, place, and time. Coordination normal.  Skin: Skin is warm and dry. No rash noted. He is not diaphoretic.  Psychiatric: He has a normal mood and affect. His behavior is normal.  Vitals reviewed.      Assessment & Plan:   Problem List Items Addressed This Visit    None    Visit Diagnoses    Acute rhinosinusitis    -  Primary    Will use flonase and antihistamine, if not improved will pick up z-pak    Relevant Medications    azithromycin (ZITHROMAX) 250 MG tablet    mometasone (NASONEX) 50 MCG/ACT nasal spray        Follow up plan: Return if symptoms worsen or fail to improve.  Counseling provided for all of the vaccine components No orders of the defined types were placed in this encounter.    Caryl Pina, MD [facility] 03/27/2015, 10:33 AM

## 2015-06-09 ENCOUNTER — Encounter: Payer: Self-pay | Admitting: Family Medicine

## 2015-06-09 ENCOUNTER — Encounter (INDEPENDENT_AMBULATORY_CARE_PROVIDER_SITE_OTHER): Payer: Self-pay

## 2015-06-09 ENCOUNTER — Ambulatory Visit (INDEPENDENT_AMBULATORY_CARE_PROVIDER_SITE_OTHER): Payer: 59 | Admitting: Family Medicine

## 2015-06-09 VITALS — BP 121/84 | HR 71 | Temp 97.0°F | Ht 69.0 in | Wt 197.4 lb

## 2015-06-09 DIAGNOSIS — Z125 Encounter for screening for malignant neoplasm of prostate: Secondary | ICD-10-CM

## 2015-06-09 DIAGNOSIS — Z114 Encounter for screening for human immunodeficiency virus [HIV]: Secondary | ICD-10-CM

## 2015-06-09 DIAGNOSIS — E119 Type 2 diabetes mellitus without complications: Secondary | ICD-10-CM

## 2015-06-09 DIAGNOSIS — Z1211 Encounter for screening for malignant neoplasm of colon: Secondary | ICD-10-CM

## 2015-06-09 DIAGNOSIS — Z0000 Encounter for general adult medical examination without abnormal findings: Secondary | ICD-10-CM | POA: Diagnosis not present

## 2015-06-09 DIAGNOSIS — L309 Dermatitis, unspecified: Secondary | ICD-10-CM

## 2015-06-09 DIAGNOSIS — I10 Essential (primary) hypertension: Secondary | ICD-10-CM

## 2015-06-09 LAB — BAYER DCA HB A1C WAIVED: HB A1C (BAYER DCA - WAIVED): 6.2 % (ref <7.0)

## 2015-06-09 MED ORDER — TRIAMCINOLONE ACETONIDE 0.1 % EX CREA: 1.0000 "application " | TOPICAL_CREAM | Freq: Two times a day (BID) | CUTANEOUS | Status: DC

## 2015-06-09 NOTE — Progress Notes (Addendum)
BP 121/84 mmHg  Pulse 71  Temp(Src) 97 F (36.1 C) (Oral)  Ht 5' 9"  (1.753 m)  Wt 197 lb 6.4 oz (89.54 kg)  BMI 29.14 kg/m2   Subjective:    Patient ID: [name] [name], male    DOB: 04-Feb-1966, 50 y.o.   MRN: [id number]  HPI: [name] [name] is a 50 y.o. male presenting on 06/09/2015 for complete physical   HPI Well adult exam Patient presents today for well adult exam. He also presents for screening labs. He denies any major issues except a small rash. He denies any chest pain, shortness of breath, headaches or vision issues, abdominal complaints, diarrhea, nausea, vomiting, or joint issues.   Hypertension recheck Patient is coming in today for hypertension recheck. He is currently on lisinopril-hydrochlorothiazide and his blood pressure today is 121/84. Patient denies headaches, blurred vision, chest pains, shortness of breath, or weakness. Denies any side effects from medication and is content with current medication.   Type 2 diabetes recheck Patient is coming in today for a recheck on his type 2 diabetes.he has currently been diet controlled and his last hemoglobin A1c was 5.8. He will do his blood work on the way out and we will see what his new hemoglobin A1c is. He has done occasional blood sugar checks and they have typically been in the 100-120 range early in the mornings.  Relevant past medical, surgical, family and social history reviewed and updated as indicated. Interim medical history since our last visit reviewed. Allergies and medications reviewed and updated.  Review of Systems  Constitutional: Negative for fever and chills.  HENT: Negative for ear discharge and ear pain.   Eyes: Negative for discharge and visual disturbance.  Respiratory: Negative for shortness of breath and wheezing.   Cardiovascular: Negative for chest pain and leg swelling.  Gastrointestinal: Negative for abdominal pain, diarrhea and constipation.  Endocrine: Negative for cold intolerance, heat  intolerance, polydipsia and polyuria.  Genitourinary: Negative for difficulty urinating.  Musculoskeletal: Negative for back pain and gait problem.  Skin: Positive for rash.  Neurological: Negative for dizziness, syncope, light-headedness and headaches.  All other systems reviewed and are negative.   Per HPI unless specifically indicated above     Medication List       This list is accurate as of: 06/09/15 11:11 AM.  Always use your most recent med list.               acetaminophen 500 MG tablet  Commonly known as:  TYLENOL  Take 500 mg by mouth every 6 (six) hours as needed.     cholecalciferol 1000 units tablet  Commonly known as:  VITAMIN D  Take 1,000 Units by mouth daily. 5000     FLAX SEEDS PO  Take by mouth.     fluticasone 50 MCG/ACT nasal spray  Commonly known as:  FLONASE  Place into both nostrils daily.     glucose blood test strip  Use to check BG daily     lisinopril-hydrochlorothiazide 20-25 MG tablet  Commonly known as:  PRINZIDE,ZESTORETIC  TAKE 1 TABLET BY MOUTH DAILY.     multivitamin with minerals tablet  Take 1 tablet by mouth daily.     ONETOUCH DELICA LANCETS 91M Misc  Use to check BG daily           Objective:    BP 121/84 mmHg  Pulse 71  Temp(Src) 97 F (36.1 C) (Oral)  Ht 5' 9"  (1.753 m)  Wt  197 lb 6.4 oz (89.54 kg)  BMI 29.14 kg/m2  Wt Readings from Last 3 Encounters:  06/09/15 197 lb 6.4 oz (89.54 kg)  03/27/15 200 lb 6.4 oz (90.901 kg)  06/12/14 190 lb 8 oz (86.41 kg)    Physical Exam  Constitutional: He is oriented to person, place, and time. He appears well-developed and well-nourished. No distress.  Eyes: Conjunctivae and EOM are normal. Pupils are equal, round, and reactive to light. Right eye exhibits no discharge. No scleral icterus.  Neck: Neck supple. No thyromegaly present.  Cardiovascular: Normal rate, regular rhythm, normal heart sounds and intact distal pulses.   No murmur heard. Pulmonary/Chest: Effort  normal and breath sounds normal. No respiratory distress. He has no wheezes.  Musculoskeletal: Normal range of motion. He exhibits no edema.  Lymphadenopathy:    He has no cervical adenopathy.  Neurological: He is alert and oriented to person, place, and time. Coordination normal.  Skin: Skin is warm and dry. Rash (small fine maculopapular rash with excoriaitons on posterior forearm) noted. He is not diaphoretic.  Psychiatric: He has a normal mood and affect. His behavior is normal.  Nursing note and vitals reviewed.     Assessment & Plan:   Problem List Items Addressed This Visit      Cardiovascular and Mediastinum   Hypertension   Relevant Medications   lisinopril-hydrochlorothiazide (PRINZIDE,ZESTORETIC) 20-25 MG tablet   Other Relevant Orders   CMP14+EGFR (Completed)   Lipid panel (Completed)     Endocrine   Type 2 diabetes mellitus (HCC)   Relevant Medications   lisinopril-hydrochlorothiazide (PRINZIDE,ZESTORETIC) 20-25 MG tablet   Other Relevant Orders   Bayer DCA Hb A1c Waived (Completed)   CMP14+EGFR (Completed)    Other Visit Diagnoses    Well adult exam    -  Primary    Screening for HIV without presence of risk factors        Relevant Orders    HIV antibody (Completed)    Special screening for malignant neoplasms, colon        Relevant Orders    Ambulatory referral to Gastroenterology    Prostate cancer screening        Relevant Orders    PSA, total and free (Completed)    Eczema        Relevant Medications    triamcinolone cream (KENALOG) 0.1 %        Follow up plan: Return if symptoms worsen or fail to improve.  Counseling provided for all of the vaccine components Orders Placed This Encounter  Procedures  . Bayer DCA Hb A1c Waived  . CMP14+EGFR  . Lipid panel  . HIV antibody  . PSA, total and free  . Ambulatory referral to Gastroenterology    Caryl Pina, MD [facility] 06/09/2015, 11:11 AM

## 2015-06-10 LAB — CMP14+EGFR
ALK PHOS: 48 IU/L (ref 39–117)
ALT: 24 IU/L (ref 0–44)
AST: 23 IU/L (ref 0–40)
Albumin/Globulin Ratio: 2 (ref 1.1–2.5)
Albumin: 4.9 g/dL (ref 3.5–5.5)
BUN/Creatinine Ratio: 18 (ref 9–20)
BUN: 18 mg/dL (ref 6–24)
Bilirubin Total: 0.7 mg/dL (ref 0.0–1.2)
CO2: 23 mmol/L (ref 18–29)
CREATININE: 1.02 mg/dL (ref 0.76–1.27)
Calcium: 10.3 mg/dL — ABNORMAL HIGH (ref 8.7–10.2)
Chloride: 95 mmol/L — ABNORMAL LOW (ref 96–106)
GFR calc Af Amer: 99 mL/min/{1.73_m2} (ref >59)
GFR calc non Af Amer: 86 mL/min/{1.73_m2} (ref >59)
GLUCOSE: 112 mg/dL — AB (ref 65–99)
Globulin, Total: 2.4 g/dL (ref 1.5–4.5)
Potassium: 4.5 mmol/L (ref 3.5–5.2)
Sodium: 138 mmol/L (ref 134–144)
Total Protein: 7.3 g/dL (ref 6.0–8.5)

## 2015-06-10 LAB — LIPID PANEL
CHOLESTEROL TOTAL: 191 mg/dL (ref 100–199)
Chol/HDL Ratio: 4.3 ratio units (ref 0.0–5.0)
HDL: 44 mg/dL (ref >39)
LDL CALC: 129 mg/dL — AB (ref 0–99)
TRIGLYCERIDES: 89 mg/dL (ref 0–149)
VLDL CHOLESTEROL CAL: 18 mg/dL (ref 5–40)

## 2015-06-10 LAB — PSA, TOTAL AND FREE
PSA FREE: 0.16 ng/mL
PSA, Free Pct: 22.9 %
Prostate Specific Ag, Serum: 0.7 ng/mL (ref 0.0–4.0)

## 2015-06-10 LAB — HIV ANTIBODY (ROUTINE TESTING W REFLEX): HIV Screen 4th Generation wRfx: NONREACTIVE

## 2015-06-10 MED ORDER — LISINOPRIL-HYDROCHLOROTHIAZIDE 20-25 MG PO TABS: 1.0000 | ORAL_TABLET | Freq: Every day | ORAL | Status: DC

## 2015-11-21 LAB — HM DIABETES EYE EXAM

## 2015-12-09 ENCOUNTER — Other Ambulatory Visit: Payer: Self-pay | Admitting: Family Medicine

## 2016-02-12 ENCOUNTER — Telehealth: Payer: Self-pay | Admitting: Family Medicine

## 2016-02-12 NOTE — Telephone Encounter (Signed)
Pt aware.

## 2016-03-04 ENCOUNTER — Encounter: Payer: Self-pay | Admitting: Pediatrics

## 2016-03-04 ENCOUNTER — Ambulatory Visit (INDEPENDENT_AMBULATORY_CARE_PROVIDER_SITE_OTHER): Payer: 59 | Admitting: Pediatrics

## 2016-03-04 VITALS — BP 120/77 | HR 69 | Temp 97.4°F | Ht 69.0 in | Wt 202.6 lb

## 2016-03-04 DIAGNOSIS — H938X1 Other specified disorders of right ear: Secondary | ICD-10-CM | POA: Diagnosis not present

## 2016-03-04 DIAGNOSIS — H6121 Impacted cerumen, right ear: Secondary | ICD-10-CM | POA: Diagnosis not present

## 2016-03-04 NOTE — Progress Notes (Signed)
  Subjective:   Patient ID: [name] [name], male    DOB: Jan 16, 1966, 50 y.o.   MRN: [id number] CC: Ear Pain  HPI: KEIMON BASALDUA is a 50 y.o. male presenting for Ear Pain  Ongoing several weeks Notices it most in the morning, feels like he needs ot pop his ears, but popping doesn't help Got ear wax cleaning kit but hasnt used it yet On flonase daily for months  Relevant past medical, surgical, family and social history reviewed. Allergies and medications reviewed and updated. History  Smoking Status  . Never Smoker  Smokeless Tobacco  . Never Used   ROS: Per HPI   Objective:    BP 120/77   Pulse 69   Temp 97.4 F (36.3 C) (Oral)   Ht _0  (1.753 m)   Wt 202 lb 9.6 oz (91.9 kg)   BMI 29.92 kg/m   Wt Readings from Last 3 Encounters:  03/04/16 202 lb 9.6 oz (91.9 kg)  06/09/15 197 lb 6.4 oz (89.5 kg)  03/27/15 200 lb 6.4 oz (90.9 kg)    Gen: NAD, alert, cooperative with exam, NCAT EYES: EOMI, no conjunctival injection, or no icterus ENT:  R TM with impacted cerumen, , after cerumen removal dull, retracted, L TM pearly gray. OP without erythema LYMPH: no cervical LAD CV: WWP Resp:  normal WOB Neuro: Alert and oriented  Assessment & Plan:  Wendy was seen today for ear pain. Cerumen impaction found, removed with irrigation as below. Some improvement of symptoms, says ears still feel different. R TM dull, retracted after removal compared with normal L TM. Continue flonase. Start antihistamine. If still bothering him will refer to ENT.  Diagnoses and all orders for this visit:  Ear pressure, right  Impacted cerumen of right ear  Procedure: Impacted cerumen removal: After procedure described to patient and granddaughter and patient agreed with proceeding, cerumen impaction was removed from R ear using irrigation.  Pt tolerated procedure well.    Follow up plan: prn Assunta Found, MD [place]

## 2016-05-31 DIAGNOSIS — D473 Essential (hemorrhagic) thrombocythemia: Secondary | ICD-10-CM | POA: Diagnosis not present

## 2016-05-31 DIAGNOSIS — E559 Vitamin D deficiency, unspecified: Secondary | ICD-10-CM | POA: Diagnosis not present

## 2016-05-31 DIAGNOSIS — D72829 Elevated white blood cell count, unspecified: Secondary | ICD-10-CM | POA: Diagnosis not present

## 2016-05-31 DIAGNOSIS — Z79899 Other long term (current) drug therapy: Secondary | ICD-10-CM | POA: Diagnosis not present

## 2016-06-08 ENCOUNTER — Other Ambulatory Visit: Payer: Self-pay | Admitting: Family Medicine

## 2016-06-10 DIAGNOSIS — D72829 Elevated white blood cell count, unspecified: Secondary | ICD-10-CM | POA: Diagnosis not present

## 2016-06-10 DIAGNOSIS — D473 Essential (hemorrhagic) thrombocythemia: Secondary | ICD-10-CM | POA: Diagnosis not present

## 2016-06-23 ENCOUNTER — Ambulatory Visit (INDEPENDENT_AMBULATORY_CARE_PROVIDER_SITE_OTHER): Payer: 59 | Admitting: Family Medicine

## 2016-06-23 ENCOUNTER — Ambulatory Visit (INDEPENDENT_AMBULATORY_CARE_PROVIDER_SITE_OTHER): Payer: 59

## 2016-06-23 ENCOUNTER — Encounter: Payer: Self-pay | Admitting: Family Medicine

## 2016-06-23 VITALS — BP 126/68 | HR 64 | Temp 97.4°F | Ht 69.0 in | Wt 196.0 lb

## 2016-06-23 DIAGNOSIS — I10 Essential (primary) hypertension: Secondary | ICD-10-CM

## 2016-06-23 DIAGNOSIS — Z0000 Encounter for general adult medical examination without abnormal findings: Secondary | ICD-10-CM

## 2016-06-23 DIAGNOSIS — M79602 Pain in left arm: Secondary | ICD-10-CM | POA: Diagnosis not present

## 2016-06-23 DIAGNOSIS — E119 Type 2 diabetes mellitus without complications: Secondary | ICD-10-CM

## 2016-06-23 DIAGNOSIS — M545 Low back pain: Secondary | ICD-10-CM | POA: Diagnosis not present

## 2016-06-23 DIAGNOSIS — N400 Enlarged prostate without lower urinary tract symptoms: Secondary | ICD-10-CM

## 2016-06-23 LAB — URINALYSIS, COMPLETE
BILIRUBIN UA: NEGATIVE
Glucose, UA: NEGATIVE
KETONES UA: NEGATIVE
LEUKOCYTES UA: NEGATIVE
Nitrite, UA: NEGATIVE
PROTEIN UA: NEGATIVE
RBC UA: NEGATIVE
SPEC GRAV UA: 1.02 (ref 1.005–1.030)
UUROB: 0.2 mg/dL (ref 0.2–1.0)
pH, UA: 5 (ref 5.0–7.5)

## 2016-06-23 LAB — MICROSCOPIC EXAMINATION
Bacteria, UA: NONE SEEN
Epithelial Cells (non renal): NONE SEEN /hpf (ref 0–10)
RBC, UA: NONE SEEN /hpf (ref 0–?)
Renal Epithel, UA: NONE SEEN /hpf
WBC UA: NONE SEEN /HPF (ref 0–?)

## 2016-06-23 LAB — BAYER DCA HB A1C WAIVED: HB A1C: 7.1 % — AB (ref <7.0)

## 2016-06-23 MED ORDER — LISINOPRIL-HYDROCHLOROTHIAZIDE 20-25 MG PO TABS: 1.0000 | ORAL_TABLET | Freq: Every day | ORAL | 3 refills | Status: DC

## 2016-06-23 NOTE — Patient Instructions (Addendum)
Continue current medications. Continue good therapeutic lifestyle changes which include good diet and exercise. Fall precautions discussed with patient. If an FOBT was given today- please return it to our front desk. If you are over 51 years old - you may need Prevnar 57 or the adult Pneumonia vaccine.  **Flu shots are available--- please call and schedule a FLU-CLINIC appointment**  After your visit with [place] today you will receive a survey in the mail or online from [employer] regarding your care with [place]. Please take a moment to fill this out. Your feedback is very important to [place] as you can help [place] better understand your patient needs as well as improve your experience and satisfaction. WE CARE ABOUT YOU!!!  Please return the FOBT We'll call you with the results of the chest x-ray lumbar spine and cervical spine films as soon as those results become available We will ask you to come back to review the lab work in 3-4 weeks so we can make recommendations regarding ongoing care with your diabetes and hyperlipidemia. At that time we may also do a referral for an exercise treadmill test because of the strong family history of heart disease. We will also consider repeating her colonoscopy since you're already 51 years old. In the meantime reduce the use of the elliptical machine until we can sort out what is going on with the neck.

## 2016-06-23 NOTE — Progress Notes (Signed)
Subjective:    Patient ID: [name] [name], male    DOB: 01/10/1966, 51 y.o.   MRN: [id number]  HPI Patient is here today for annual wellness exam and follow up of chronic medical problems which includes diabetes and hypertension. He is taking medication regularly.This patient has a history of type 2 diabetes mellitus hyperlipidemia hypertension and vitamin D deficiency. His last hemoglobin A1c was 6.2% and this was in March 2017. At that time the patient did not want to start any medicines and wished to try to control his blood sugar with diet and exercise. His last LDL C was 129 with a triglyceride of 89 and this was also in March 2017. The patient is 51 years old and he has a strong history of heart disease in his family. Today he is complaining with some low back and left hip pain is also complaining with some numbness in his left arm. He is due to get his regular foot exam evaluation because of his diabetes. He also needs to have or get started on a statin drug because of having diabetes. We will do all of this once his lab work is returned and try to get everything under better control. The patient will also be due to get a routine colonoscopy this year or late next year. The patient denies any chest pain or shortness of breath. He denies any trouble with his intestinal tract including nausea vomiting diarrhea blood in the stool or black tarry bowel movements. He is passing his water without problems and has no erectile dysfunction issues. He exercises regularly and is feeling that the elliptical machine may be playing a problem with his arm numbness in the left is side. He's also been having some low back pain without radiation. His reduce some of the elliptical and this is helped the left arm numbness. He says the low back pain is worse with standing but gets better when he sits down or when he lays down. The left arm numbness is been going on for about 2 weeks. We discussed with him that at some point  in the future he may need a stress test because of his family history and his personal history of diabetes and elevated cholesterol. He is also to be needing a repeat colonoscopy at his age of 51 almost 64 now.  Patient Active Problem List   Diagnosis Date Noted  . Type 2 diabetes mellitus ([place]) 07/01/2013  . Dyslipidemia associated with type 2 diabetes mellitus ([place]) 07/01/2013  . Vitamin D deficiency 06/03/2013  . Chronic pain   . Hypertension   . RECTAL BLEEDING 01/22/2008   Outpatient Encounter Prescriptions as of 06/23/2016  Medication Sig  . acetaminophen (TYLENOL) 500 MG tablet Take 500 mg by mouth every 6 (six) hours as needed.  . cholecalciferol (VITAMIN D) 1000 UNITS tablet Take 1,000 Units by mouth daily. 5000  . fluticasone (FLONASE) 50 MCG/ACT nasal spray Place into both nostrils daily.  Marland Kitchen glucose blood test strip Use to check BG daily  . lisinopril-hydrochlorothiazide (PRINZIDE,ZESTORETIC) 20-25 MG tablet TAKE 1 TABLET BY MOUTH DAILY.  . Multiple Vitamins-Minerals (MULTIVITAMIN WITH MINERALS) tablet Take 1 tablet by mouth daily.  [name] DELICA LANCETS 53M MISC Use to check BG daily  . triamcinolone cream (KENALOG) 0.1 % Apply 1 application topically 2 (two) times daily.  . [DISCONTINUED] Flaxseed, Linseed, (FLAX SEEDS PO) Take by mouth.   No facility-administered encounter medications on file as of 06/23/2016.  Review of Systems  Constitutional: Negative.   HENT: Negative.   Eyes: Negative.   Respiratory: Negative.   Cardiovascular: Negative.   Gastrointestinal: Negative.   Endocrine: Negative.   Genitourinary: Negative.   Musculoskeletal: Positive for back pain (low back and left hip pain).  Skin: Negative.   Allergic/Immunologic: Negative.   Neurological: Negative.   Hematological: Negative.   Psychiatric/Behavioral: Negative.        Objective:   Physical Exam  Constitutional: He is oriented to person, place, and time. He appears well-developed  and well-nourished. No distress.  HENT:  Head: Normocephalic and atraumatic.  Right Ear: External ear normal.  Left Ear: External ear normal.  Nose: Nose normal.  Mouth/Throat: Oropharynx is clear and moist. No oropharyngeal exudate.  Eyes: Conjunctivae and EOM are normal. Pupils are equal, round, and reactive to light. Right eye exhibits no discharge. Left eye exhibits no discharge. No scleral icterus.  The patient gets his eye exams yearly.  Neck: Normal range of motion. Neck supple. No thyromegaly present.  Cardiovascular: Normal rate, regular rhythm, normal heart sounds and intact distal pulses.   No murmur heard. The heart is regular at 60/m  Pulmonary/Chest: Effort normal and breath sounds normal. No respiratory distress. He has no wheezes. He has no rales. He exhibits no tenderness.  No axillary adenopathy or chest wall masses Clear anteriorly and posteriorly  Abdominal: Soft. Bowel sounds are normal. He exhibits no mass. There is no tenderness. There is no rebound and no guarding.  No abdominal tenderness liver or spleen enlargement bruits or inguinal adenopathy  Genitourinary: Rectum normal and penis normal.  Genitourinary Comments: The prostate is slightly enlarged without any lumps or masses. There were no rectal masses. There are no inguinal hernias. External genitalia were within normal limits.  Musculoskeletal: Normal range of motion. He exhibits no edema.  Lymphadenopathy:    He has no cervical adenopathy.  Neurological: He is alert and oriented to person, place, and time. He has normal reflexes. No cranial nerve deficit.  Skin: Skin is warm and dry. No rash noted.  Psychiatric: He has a normal mood and affect. His behavior is normal. Judgment and thought content normal.  Nursing note and vitals reviewed.  BP 126/68 (BP Location: Left Arm)   Pulse 64   Temp 97.4 F (36.3 C) (Oral)   Ht 5' 9"  (1.753 m)   Wt 196 lb (88.9 kg)   BMI 28.94 kg/m         Assessment &  Plan:  1. Annual physical exam -We will review all lab work results and discuss a treatment plan moving forward. -This may include an ETT and a colonoscopy in addition to the lab work and chest x-ray work that is done today. - CBC with Differential/Platelet - BMP8+EGFR - Hepatic function panel - VITAMIN D 25 Hydroxy (Vit-D Deficiency, Fractures) - NMR, lipoprofile - PSA, total and free - Urinalysis, Complete - DG Chest 2 View; Future - EKG 12-Lead  2. Type 2 diabetes mellitus without complication, without long-term current use of insulin ([place]) -Continue aggressive therapeutic lifestyle changes pending results of lab work and A1c - CBC with Differential/Platelet - BMP8+EGFR - NMR, lipoprofile - DG Chest 2 View; Future - Bayer DCA Hb A1c Waived  3. Essential hypertension -The blood pressure is good today and he should continue with current treatment - CBC with Differential/Platelet - BMP8+EGFR - Hepatic function panel - NMR, lipoprofile - DG Chest 2 View; Future - EKG 12-Lead  4. Low back pain,  unspecified back pain laterality, unspecified chronicity, with sciatica presence unspecified -No treatment recommendations until x-rays are returned and lab work is reviewed - DG Lumbar Spine 2-3 Views; Future  5. Left arm pain -Review lab work and x-rays before any further recommendations. -It is important to note that the patient had carpal tunnel syndrome on the right side. - DG Cervical Spine Complete; Future  6. Benign prostatic hyperplasia without lower urinary tract symptoms -Asian has no symptoms with this prostate was slightly enlarged and the rectal exam was clear.  Meds ordered this encounter  Medications  . lisinopril-hydrochlorothiazide (PRINZIDE,ZESTORETIC) 20-25 MG tablet    Sig: Take 1 tablet by mouth daily.    Dispense:  90 tablet    Refill:  3   Patient Instructions  Continue current medications. Continue good therapeutic lifestyle changes which include good  diet and exercise. Fall precautions discussed with patient. If an FOBT was given today- please return it to our front desk. If you are over 50 years old - you may need Prevnar 58 or the adult Pneumonia vaccine.  **Flu shots are available--- please call and schedule a FLU-CLINIC appointment**  After your visit with [place] today you will receive a survey in the mail or online from [employer] regarding your care with [place]. Please take a moment to fill this out. Your feedback is very important to [place] as you can help [place] better understand your patient needs as well as improve your experience and satisfaction. WE CARE ABOUT YOU!!!  Please return the FOBT We'll call you with the results of the chest x-ray lumbar spine and cervical spine films as soon as those results become available We will ask you to come back to review the lab work in 3-4 weeks so we can make recommendations regarding ongoing care with your diabetes and hyperlipidemia. At that time we may also do a referral for an exercise treadmill test because of the strong family history of heart disease. We will also consider repeating her colonoscopy since you're already 51 years old. In the meantime reduce the use of the elliptical machine until we can sort out what is going on with the neck.   Arrie Senate MD

## 2016-06-24 ENCOUNTER — Telehealth: Payer: Self-pay | Admitting: Family Medicine

## 2016-06-24 ENCOUNTER — Telehealth: Payer: Self-pay

## 2016-06-24 LAB — BMP8+EGFR
BUN/Creatinine Ratio: 23 — ABNORMAL HIGH (ref 9–20)
BUN: 23 mg/dL (ref 6–24)
CHLORIDE: 98 mmol/L (ref 96–106)
CO2: 28 mmol/L (ref 18–29)
Calcium: 10.3 mg/dL — ABNORMAL HIGH (ref 8.7–10.2)
Creatinine, Ser: 0.99 mg/dL (ref 0.76–1.27)
GFR calc Af Amer: 102 mL/min/{1.73_m2} (ref >59)
GFR, EST NON AFRICAN AMERICAN: 88 mL/min/{1.73_m2} (ref >59)
Glucose: 98 mg/dL (ref 65–99)
POTASSIUM: 4.6 mmol/L (ref 3.5–5.2)
SODIUM: 140 mmol/L (ref 134–144)

## 2016-06-24 LAB — HEPATIC FUNCTION PANEL
ALT: 20 IU/L (ref 0–44)
AST: 19 IU/L (ref 0–40)
Albumin: 4.7 g/dL (ref 3.5–5.5)
Alkaline Phosphatase: 45 IU/L (ref 39–117)
Bilirubin Total: 0.5 mg/dL (ref 0.0–1.2)
Bilirubin, Direct: 0.17 mg/dL (ref 0.00–0.40)
Total Protein: 7.2 g/dL (ref 6.0–8.5)

## 2016-06-24 LAB — CBC WITH DIFFERENTIAL/PLATELET
BASOS ABS: 0 10*3/uL (ref 0.0–0.2)
Basos: 0 %
EOS (ABSOLUTE): 0.2 10*3/uL (ref 0.0–0.4)
EOS: 3 %
HEMATOCRIT: 42.5 % (ref 37.5–51.0)
Hemoglobin: 14.1 g/dL (ref 13.0–17.7)
IMMATURE GRANULOCYTES: 0 %
Immature Grans (Abs): 0 10*3/uL (ref 0.0–0.1)
LYMPHS ABS: 2.4 10*3/uL (ref 0.7–3.1)
Lymphs: 34 %
MCH: 28.8 pg (ref 26.6–33.0)
MCHC: 33.2 g/dL (ref 31.5–35.7)
MCV: 87 fL (ref 79–97)
MONOS ABS: 0.7 10*3/uL (ref 0.1–0.9)
Monocytes: 10 %
NEUTROS PCT: 53 %
Neutrophils Absolute: 3.7 10*3/uL (ref 1.4–7.0)
PLATELETS: 410 10*3/uL — AB (ref 150–379)
RBC: 4.89 x10E6/uL (ref 4.14–5.80)
RDW: 13.4 % (ref 12.3–15.4)
WBC: 7 10*3/uL (ref 3.4–10.8)

## 2016-06-24 LAB — NMR, LIPOPROFILE
Cholesterol: 171 mg/dL (ref 100–199)
HDL Cholesterol by NMR: 46 mg/dL (ref >39)
HDL PARTICLE NUMBER: 35.4 umol/L (ref >=30.5)
LDL PARTICLE NUMBER: 1150 nmol/L — AB (ref <1000)
LDL Size: 20.9 nm (ref >20.5)
LDL-C: 112 mg/dL — AB (ref 0–99)
LP-IR SCORE: 50 — AB (ref <=45)
Small LDL Particle Number: 365 nmol/L (ref <=527)
Triglycerides by NMR: 64 mg/dL (ref 0–149)

## 2016-06-24 LAB — PSA, TOTAL AND FREE
PROSTATE SPECIFIC AG, SERUM: 0.7 ng/mL (ref 0.0–4.0)
PSA, Free Pct: 22.9 %
PSA, Free: 0.16 ng/mL

## 2016-06-24 LAB — VITAMIN D 25 HYDROXY (VIT D DEFICIENCY, FRACTURES): Vit D, 25-Hydroxy: 71.2 ng/mL (ref 30.0–100.0)

## 2016-06-24 NOTE — Telephone Encounter (Signed)
[facility] to x-ray 

## 2016-06-24 NOTE — Telephone Encounter (Signed)
[name] [name] is aware that patient called her back

## 2016-06-24 NOTE — Telephone Encounter (Signed)
Pt wanted more details of x-rays . Pt will contact us if Aleve does not help

## 2016-06-25 MED ORDER — METFORMIN HCL 500 MG PO TABS: 500.0000 mg | ORAL_TABLET | Freq: Two times a day (BID) | ORAL | 5 refills | Status: DC

## 2016-06-25 MED ORDER — ROSUVASTATIN CALCIUM 5 MG PO TABS: 5.0000 mg | ORAL_TABLET | Freq: Every day | ORAL | 3 refills | Status: DC

## 2016-06-25 NOTE — Telephone Encounter (Signed)
Patient aware of results and rx for crestor and metformin sent to pharmacy.

## 2016-08-03 ENCOUNTER — Encounter: Payer: Self-pay | Admitting: Family Medicine

## 2016-08-03 ENCOUNTER — Ambulatory Visit (INDEPENDENT_AMBULATORY_CARE_PROVIDER_SITE_OTHER): Payer: 59 | Admitting: Family Medicine

## 2016-08-03 VITALS — BP 122/82 | HR 62 | Temp 97.7°F | Ht 69.0 in | Wt 189.0 lb

## 2016-08-03 DIAGNOSIS — E119 Type 2 diabetes mellitus without complications: Secondary | ICD-10-CM | POA: Diagnosis not present

## 2016-08-03 DIAGNOSIS — E7800 Pure hypercholesterolemia, unspecified: Secondary | ICD-10-CM

## 2016-08-03 DIAGNOSIS — M545 Low back pain: Secondary | ICD-10-CM

## 2016-08-03 NOTE — Progress Notes (Signed)
Subjective:    Patient ID: [name] [name], male    DOB: 06/03/1965, 51 y.o.   MRN: [id number]  HPI Patient here today for 4 week follow up on low back pain, hyperlipidemia and arm numbness. The patient continues to have some low back pain. The recent x-rays of the LS spine were reviewed with the patient indicating some mild wear and tear arthritis and spurring. He also had cervical spine films which were negative. Also complains of some  arm numbness.The patient denies any pain going down his legs. This pain is worse with standing and worse with arising from sitting. It seems to get better with walking. It is in the low back. The x-rays were reviewed with him and he was given copies of these reports. He lays on his left side because he is had shoulder pain and surgery on the right side. The problem with the numbness in the left arm may very well be due to positioning in the bed and he was made aware of this in the discussion. He denies any chest pain or shortness of breath. He denies any trouble with nausea vomiting diarrhea blood in the stool or black tarry bowel movements. He is passing his water without problems. He does admit to a flu like sensation. This is been worse since starting metformin and Crestor. His blood sugars have been running good at home usually less than 100 and sometimes as low as 80. He has not had any hypoglycemic episodes that he is aware of. We discussed side effects of medicines and as result of the flulike symptoms he is having he will discontinue the Crestor for the time being. He's been much more aggressive with his diet and exercise regimen and would like not to restart this but I told him that it is a matter of protocol that he will need to try another statin because he has diabetes.    Patient Active Problem List   Diagnosis Date Noted  . Type 2 diabetes mellitus ([place]) 07/01/2013  . Dyslipidemia associated with type 2 diabetes mellitus ([place]) 07/01/2013  . Vitamin D  deficiency 06/03/2013  . Chronic pain   . Hypertension   . RECTAL BLEEDING 01/22/2008   Outpatient Encounter Prescriptions as of 08/03/2016  Medication Sig  . acetaminophen (TYLENOL) 500 MG tablet Take 500 mg by mouth every 6 (six) hours as needed.  . cholecalciferol (VITAMIN D) 1000 UNITS tablet Take 1,000 Units by mouth daily. 5000  . fluticasone (FLONASE) 50 MCG/ACT nasal spray Place into both nostrils daily.  Marland Kitchen glucose blood test strip Use to check BG daily  . lisinopril-hydrochlorothiazide (PRINZIDE,ZESTORETIC) 20-25 MG tablet Take 1 tablet by mouth daily.  . metFORMIN (GLUCOPHAGE) 500 MG tablet Take 1 tablet (500 mg total) by mouth 2 (two) times daily with a meal.  . Multiple Vitamins-Minerals (MULTIVITAMIN WITH MINERALS) tablet Take 1 tablet by mouth daily.  [name] DELICA LANCETS 37T MISC Use to check BG daily  . rosuvastatin (CRESTOR) 5 MG tablet Take 1 tablet (5 mg total) by mouth daily.  Marland Kitchen triamcinolone cream (KENALOG) 0.1 % Apply 1 application topically 2 (two) times daily.   No facility-administered encounter medications on file as of 08/03/2016.       Review of Systems  Constitutional: Negative.   HENT: Negative.   Eyes: Negative.   Respiratory: Negative.   Cardiovascular: Negative.   Gastrointestinal: Negative.   Endocrine: Negative.   Genitourinary: Negative.   Musculoskeletal: Positive for back pain (back pain and  arm numbness are better ).  Skin: Negative.   Allergic/Immunologic: Negative.   Neurological: Negative.   Hematological: Negative.   Psychiatric/Behavioral: Negative.        Objective:   Physical Exam  Constitutional: He is oriented to person, place, and time. He appears well-developed and well-nourished. No distress.  HENT:  Head: Normocephalic and atraumatic.  Eyes: Conjunctivae and EOM are normal. Pupils are equal, round, and reactive to light. Right eye exhibits no discharge. Left eye exhibits no discharge. No scleral icterus.  Neck: Normal  range of motion. Neck supple. No thyromegaly present.  Cardiovascular: Normal rate, regular rhythm and normal heart sounds.   No murmur heard. Pulmonary/Chest: Effort normal and breath sounds normal. No respiratory distress. He has no wheezes. He has no rales.  Abdominal: Soft. Bowel sounds are normal. He exhibits no mass. There is no tenderness. There is no rebound and no guarding.  Musculoskeletal: Normal range of motion. He exhibits no edema.  Lymphadenopathy:    He has no cervical adenopathy.  Neurological: He is alert and oriented to person, place, and time.  Skin: Skin is warm and dry. No rash noted.  Psychiatric: He has a normal mood and affect. His behavior is normal. Judgment and thought content normal.  Nursing note and vitals reviewed.  BP 122/82 (BP Location: Left Arm)   Pulse 62   Temp 97.7 F (36.5 C) (Oral)   Ht 5\' 9"  (1.753 m)   Wt 189 lb (85.7 kg)   BMI 27.91 kg/m         Assessment & Plan:  1. Pure hypercholesterolemia -Hold Crestor due to flulike symptoms and continue with aggressive therapeutic lifestyle changes - Hepatic function panel  2. Type 2 diabetes mellitus without complication, without long-term current use of insulin ([facility]) -Continue with metformin twice daily and aggressive therapeutic lifestyle changes and recheck a lipid liver panel and hemoglobin A1c in 3 months  3. Midline low back pain, unspecified chronicity, with sciatica presence unspecified -Walking and exercise and avoid heavy lifting and continue to work on weight loss. -If back pain does not improve within 4 weeks we will get an MRI to further evaluate the pain.  Patient Instructions  Call us in 4 weeks - if low back pain is no better . If no better we will get an MRI of the back. HOLD the crestor Come back in 3 mos for lipid / liver and A1C Continue to work aggressively on therapeutic lifestyle changes   Arrie Senate MD

## 2016-08-03 NOTE — Patient Instructions (Addendum)
Call us in 4 weeks - if low back pain is no better . If no better we will get an MRI of the back. HOLD the crestor Come back in 3 mos for lipid / liver and A1C Continue to work aggressively on therapeutic lifestyle changes

## 2016-08-04 LAB — HEPATIC FUNCTION PANEL
ALK PHOS: 47 IU/L (ref 39–117)
ALT: 21 IU/L (ref 0–44)
AST: 19 IU/L (ref 0–40)
Albumin: 4.8 g/dL (ref 3.5–5.5)
Bilirubin Total: 0.6 mg/dL (ref 0.0–1.2)
Bilirubin, Direct: 0.21 mg/dL (ref 0.00–0.40)
Total Protein: 7.3 g/dL (ref 6.0–8.5)

## 2016-10-26 DIAGNOSIS — D225 Melanocytic nevi of trunk: Secondary | ICD-10-CM | POA: Diagnosis not present

## 2016-10-26 DIAGNOSIS — B351 Tinea unguium: Secondary | ICD-10-CM | POA: Diagnosis not present

## 2016-10-26 DIAGNOSIS — L821 Other seborrheic keratosis: Secondary | ICD-10-CM | POA: Diagnosis not present

## 2016-12-22 ENCOUNTER — Other Ambulatory Visit: Payer: Self-pay | Admitting: *Deleted

## 2016-12-22 MED ORDER — METFORMIN HCL 500 MG PO TABS: 500.0000 mg | ORAL_TABLET | Freq: Two times a day (BID) | ORAL | 0 refills | Status: DC

## 2017-02-01 DIAGNOSIS — H5211 Myopia, right eye: Secondary | ICD-10-CM | POA: Diagnosis not present

## 2017-02-01 DIAGNOSIS — H524 Presbyopia: Secondary | ICD-10-CM | POA: Diagnosis not present

## 2017-02-01 DIAGNOSIS — E119 Type 2 diabetes mellitus without complications: Secondary | ICD-10-CM | POA: Diagnosis not present

## 2017-02-01 LAB — HM DIABETES EYE EXAM

## 2017-02-03 ENCOUNTER — Encounter: Payer: Self-pay | Admitting: Family Medicine

## 2017-02-03 ENCOUNTER — Ambulatory Visit (INDEPENDENT_AMBULATORY_CARE_PROVIDER_SITE_OTHER): Payer: 59 | Admitting: Family Medicine

## 2017-02-03 VITALS — BP 123/83 | HR 65 | Temp 97.6°F | Ht 69.0 in | Wt 181.0 lb

## 2017-02-03 DIAGNOSIS — E119 Type 2 diabetes mellitus without complications: Secondary | ICD-10-CM

## 2017-02-03 DIAGNOSIS — M545 Low back pain: Secondary | ICD-10-CM

## 2017-02-03 DIAGNOSIS — I10 Essential (primary) hypertension: Secondary | ICD-10-CM

## 2017-02-03 DIAGNOSIS — E7800 Pure hypercholesterolemia, unspecified: Secondary | ICD-10-CM | POA: Diagnosis not present

## 2017-02-03 LAB — BAYER DCA HB A1C WAIVED: HB A1C: 6 % (ref <7.0)

## 2017-02-03 NOTE — Progress Notes (Signed)
 Subjective:    Patient ID: [name] [name], male    DOB: 01/13/1966, 51 y.o.   MRN: [id number]  HPI Pt here for follow up and management of chronic medical problems which includes diabetes, hypertension and hyperlipidemia. He is taking medication regularly.  The patient is doing well with no specific complaints.  He has a history of dyslipidemia with type 2 diabetes mellitus hypertension and vitamin D deficiency.  He is currently taking vitamin D 3 1000 lisinopril HCTZ 20/25 and metformin 500 mg.  The patient continues to have a lot of back pain especially when standing for long periods of time.  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  Cervical spine was negative.  The patient today denies any chest pain or shortness of breath.  He denies any trouble with his stomach including nausea vomiting diarrhea blood in the stool or black tarry bowel movements.  Is passing his water without problems.  He is lost 8 pounds of weight since the last visit.  His family history is positive in that his father died with renal cell carcinoma.  Somewhere along the way we will want to get an ultrasound of his abdomen to include the kidneys.     Patient Active Problem List   Diagnosis Date Noted  . Type 2 diabetes mellitus ([facility]) 07/01/2013  . Dyslipidemia associated with type 2 diabetes mellitus ([facility]) 07/01/2013  . Vitamin D deficiency 06/03/2013  . Chronic pain   . Hypertension   . RECTAL BLEEDING 01/22/2008   Outpatient Encounter Prescriptions as of 02/03/2017  Medication Sig  . acetaminophen (TYLENOL) 500 MG tablet Take 500 mg by mouth every 6 (six) hours as needed.  . cholecalciferol (VITAMIN D) 1000 UNITS tablet Take 1,000 Units by mouth daily. 5000  . fluticasone (FLONASE) 50 MCG/ACT nasal spray Place into both nostrils daily.  . glucose blood test strip Use to check BG daily  . lisinopril-hydrochlorothiazide (PRINZIDE,ZESTORETIC) 20-25 MG tablet Take 1 tablet by  mouth daily.  . metFORMIN (GLUCOPHAGE) 500 MG tablet Take 1 tablet (500 mg total) by mouth 2 (two) times daily with a meal.  . Multiple Vitamins-Minerals (MULTIVITAMIN WITH MINERALS) tablet Take 1 tablet by mouth daily.  . ONETOUCH DELICA LANCETS 33G MISC Use to check BG daily  . triamcinolone cream (KENALOG) 0.1 % Apply 1 application topically 2 (two) times daily.  . [DISCONTINUED] rosuvastatin (CRESTOR) 5 MG tablet Take 1 tablet (5 mg total) by mouth daily.   No facility-administered encounter medications on file as of 02/03/2017.       Review of Systems  Constitutional: Negative.   HENT: Negative.   Eyes: Negative.   Respiratory: Negative.   Cardiovascular: Negative.   Gastrointestinal: Negative.   Endocrine: Negative.   Genitourinary: Negative.   Musculoskeletal: Negative.   Skin: Negative.   Allergic/Immunologic: Negative.   Neurological: Negative.   Hematological: Negative.   Psychiatric/Behavioral: Negative.        Objective:   Physical Exam  Constitutional: He is oriented to person, place, and time. He appears well-developed and well-nourished. No distress.  Patient is pleasant and alert  HENT:  Head: Normocephalic and atraumatic.  Right Ear: External ear normal.  Left Ear: External ear normal.  Mouth/Throat: Oropharynx is clear and moist. No oropharyngeal exudate.  Right nares irritation and eschar noted  Eyes: Pupils are equal, round, and reactive to light. Conjunctivae and EOM are normal. Right eye exhibits no discharge. Left eye exhibits no discharge. No   scleral icterus.  Neck: Normal range of motion. Neck supple. No thyromegaly present.  No bruits thyromegaly or anterior cervical adenopathy  Cardiovascular: Normal rate, regular rhythm, normal heart sounds and intact distal pulses.   No murmur heard. Heart is regular at 72/min  Pulmonary/Chest: Effort normal and breath sounds normal. No respiratory distress. He has no wheezes. He has no rales. He exhibits no  tenderness.  No axillary adenopathy and good breath sounds anteriorly and posteriorly  Abdominal: Soft. Bowel sounds are normal. He exhibits no mass. There is no tenderness. There is no rebound and no guarding.  No inguinal adenopathy, no organomegaly is but no bruits and no masses and no suprapubic tenderness  Musculoskeletal: Normal range of motion. He exhibits no edema.  Slight tenderness in the proximal supraclavicular area on the left without adenopathy  Lymphadenopathy:    He has no cervical adenopathy.  Neurological: He is alert and oriented to person, place, and time. He has normal reflexes. No cranial nerve deficit.  Skin: Skin is warm and dry. No rash noted.  Psychiatric: He has a normal mood and affect. His behavior is normal. Judgment and thought content normal.  Nursing note and vitals reviewed.  BP 123/83 (BP Location: Left Arm)   Pulse 65   Temp 97.6 F (36.4 C) (Oral)   Ht 5' 9" (1.753 m)   Wt 181 lb (82.1 kg)   BMI 26.73 kg/m         Assessment & Plan:  1. Type 2 diabetes mellitus without complication, without long-term current use of insulin ([facility]) -Continue with current treatment pending results of hemoglobin A1c - CBC with Differential/Platelet - Bayer DCA Hb A1c Waived  2. Pure hypercholesterolemia -Continue with aggressive therapeutic lifestyle changes - CBC with Differential/Platelet - Lipid panel  3. Essential hypertension -Blood pressure is good today and he will continue with his current blood pressure medication - CBC with Differential/Platelet - BMP8+EGFR - Hepatic function panel  4. Midline low back pain, unspecified chronicity, with sciatica presence unspecified -Continue with as needed use of NSAIDs like Aleve or ibuprofen Patient Instructions  Continue current medications. Continue good therapeutic lifestyle changes which include good diet and exercise. Fall precautions discussed with patient. If an FOBT was given today- please return it  to our front desk. If you are over 50 years old - you may need Prevnar 13 or the adult Pneumonia vaccine.  **Flu shots are available--- please call and schedule a FLU-CLINIC appointment**  After your visit with us today you will receive a survey in the mail or online from [employer] regarding your care with us. Please take a moment to fill this out. Your feedback is very important to us as you can help us better understand your patient needs as well as improve your experience and satisfaction. WE CARE ABOUT YOU!!!   Try some Aleve for short period of time 1 daily after supper and see if this has any difference with helping her neck pain or back pain feel better. 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 Moore MD  

## 2017-02-03 NOTE — Patient Instructions (Addendum)
Continue current medications. Continue good therapeutic lifestyle changes which include good diet and exercise. Fall precautions discussed with patient. If an FOBT was given today- please return it to our front desk. If you are over 52 years old - you may need Prevnar 2 or the adult Pneumonia vaccine.  **Flu shots are available--- please call and schedule a FLU-CLINIC appointment**  After your visit with [place] today you will receive a survey in the mail or online from [employer] regarding your care with [place]. Please take a moment to fill this out. Your feedback is very important to [place] as you can help [place] better understand your patient needs as well as improve your experience and satisfaction. WE CARE ABOUT YOU!!!   Try some Aleve for short period of time 1 daily after supper and see if this has any difference with helping her neck pain or back pain feel better. 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

## 2017-02-04 LAB — BMP8+EGFR
BUN/Creatinine Ratio: 16 (ref 9–20)
BUN: 16 mg/dL (ref 6–24)
CALCIUM: 9.7 mg/dL (ref 8.7–10.2)
CO2: 24 mmol/L (ref 20–29)
Chloride: 95 mmol/L — ABNORMAL LOW (ref 96–106)
Creatinine, Ser: 0.99 mg/dL (ref 0.76–1.27)
GFR, EST AFRICAN AMERICAN: 102 mL/min/{1.73_m2} (ref >59)
GFR, EST NON AFRICAN AMERICAN: 88 mL/min/{1.73_m2} (ref >59)
Glucose: 81 mg/dL (ref 65–99)
POTASSIUM: 3.9 mmol/L (ref 3.5–5.2)
Sodium: 137 mmol/L (ref 134–144)

## 2017-02-04 LAB — CBC WITH DIFFERENTIAL/PLATELET
BASOS: 1 %
Basophils Absolute: 0 10*3/uL (ref 0.0–0.2)
EOS (ABSOLUTE): 0.2 10*3/uL (ref 0.0–0.4)
EOS: 3 %
HEMATOCRIT: 41.7 % (ref 37.5–51.0)
Hemoglobin: 14 g/dL (ref 13.0–17.7)
IMMATURE GRANULOCYTES: 0 %
Immature Grans (Abs): 0 10*3/uL (ref 0.0–0.1)
Lymphocytes Absolute: 2.6 10*3/uL (ref 0.7–3.1)
Lymphs: 41 %
MCH: 29.9 pg (ref 26.6–33.0)
MCHC: 33.6 g/dL (ref 31.5–35.7)
MCV: 89 fL (ref 79–97)
MONOS ABS: 0.7 10*3/uL (ref 0.1–0.9)
Monocytes: 11 %
NEUTROS ABS: 2.8 10*3/uL (ref 1.4–7.0)
NEUTROS PCT: 44 %
Platelets: 375 10*3/uL (ref 150–379)
RBC: 4.69 x10E6/uL (ref 4.14–5.80)
RDW: 13.4 % (ref 12.3–15.4)
WBC: 6.3 10*3/uL (ref 3.4–10.8)

## 2017-02-04 LAB — LIPID PANEL
Chol/HDL Ratio: 3.1 ratio (ref 0.0–5.0)
Cholesterol, Total: 175 mg/dL (ref 100–199)
HDL: 57 mg/dL (ref >39)
LDL Calculated: 108 mg/dL — ABNORMAL HIGH (ref 0–99)
Triglycerides: 50 mg/dL (ref 0–149)
VLDL Cholesterol Cal: 10 mg/dL (ref 5–40)

## 2017-02-04 LAB — HEPATIC FUNCTION PANEL
ALT: 14 IU/L (ref 0–44)
AST: 19 IU/L (ref 0–40)
Albumin: 4.8 g/dL (ref 3.5–5.5)
Alkaline Phosphatase: 42 IU/L (ref 39–117)
BILIRUBIN TOTAL: 0.4 mg/dL (ref 0.0–1.2)
BILIRUBIN, DIRECT: 0.16 mg/dL (ref 0.00–0.40)
Total Protein: 7.2 g/dL (ref 6.0–8.5)

## 2017-03-22 ENCOUNTER — Other Ambulatory Visit: Payer: Self-pay | Admitting: *Deleted

## 2017-03-22 MED ORDER — METFORMIN HCL 500 MG PO TABS: 500.0000 mg | ORAL_TABLET | Freq: Two times a day (BID) | ORAL | 0 refills | Status: DC

## 2017-04-13 ENCOUNTER — Telehealth: Payer: Self-pay | Admitting: Family Medicine

## 2017-04-13 MED ORDER — SCOPOLAMINE 1 MG/3DAYS TD PT72: 1.0000 | MEDICATED_PATCH | TRANSDERMAL | 12 refills | Status: DC

## 2017-04-14 NOTE — Telephone Encounter (Signed)
Aware, per message left on voice mail. 

## 2017-05-03 ENCOUNTER — Telehealth: Payer: Self-pay | Admitting: Family Medicine

## 2017-05-03 MED ORDER — SCOPOLAMINE 1 MG/3DAYS TD PT72: 1.0000 | MEDICATED_PATCH | TRANSDERMAL | 12 refills | Status: DC

## 2017-05-03 NOTE — Telephone Encounter (Signed)
Lm - rx sent in that was requested.

## 2017-05-03 NOTE — Telephone Encounter (Signed)
rx sent to pharmacy

## 2017-05-29 ENCOUNTER — Telehealth: Payer: Self-pay | Admitting: *Deleted

## 2017-05-29 NOTE — Telephone Encounter (Signed)
Fax received Scopolamine unavailable stopped being made, brand is over $100 Please advise [facility]

## 2017-05-29 NOTE — Telephone Encounter (Signed)
LMTCB

## 2017-05-29 NOTE — Telephone Encounter (Signed)
Can we call the other pharmacies in town and see if they have it available.  If they do please send it to 1 of them.  If they do not and the only other option is using it that I know of is over-the-counter Dramamine for travel sickness. Caryl Pina, MD [facility] 05/29/2017, 2:08 PM

## 2017-06-06 NOTE — Telephone Encounter (Signed)
Multiple attempts to contact patient.  This encounter will now be closed

## 2017-06-20 ENCOUNTER — Other Ambulatory Visit: Payer: Self-pay | Admitting: *Deleted

## 2017-06-20 MED ORDER — METFORMIN HCL 500 MG PO TABS: 500.0000 mg | ORAL_TABLET | Freq: Two times a day (BID) | ORAL | 0 refills | Status: DC

## 2017-06-20 NOTE — Telephone Encounter (Signed)
OV 06/26/17

## 2017-06-26 ENCOUNTER — Encounter: Payer: Self-pay | Admitting: Family Medicine

## 2017-06-26 ENCOUNTER — Ambulatory Visit (INDEPENDENT_AMBULATORY_CARE_PROVIDER_SITE_OTHER): Payer: 59 | Admitting: Family Medicine

## 2017-06-26 VITALS — BP 117/65 | HR 59 | Temp 97.2°F | Ht 69.0 in | Wt 183.0 lb

## 2017-06-26 DIAGNOSIS — Z8249 Family history of ischemic heart disease and other diseases of the circulatory system: Secondary | ICD-10-CM

## 2017-06-26 DIAGNOSIS — M542 Cervicalgia: Secondary | ICD-10-CM

## 2017-06-26 DIAGNOSIS — I10 Essential (primary) hypertension: Secondary | ICD-10-CM | POA: Diagnosis not present

## 2017-06-26 DIAGNOSIS — Z1211 Encounter for screening for malignant neoplasm of colon: Secondary | ICD-10-CM

## 2017-06-26 DIAGNOSIS — E119 Type 2 diabetes mellitus without complications: Secondary | ICD-10-CM | POA: Diagnosis not present

## 2017-06-26 DIAGNOSIS — Z0000 Encounter for general adult medical examination without abnormal findings: Secondary | ICD-10-CM | POA: Diagnosis not present

## 2017-06-26 DIAGNOSIS — E7800 Pure hypercholesterolemia, unspecified: Secondary | ICD-10-CM

## 2017-06-26 DIAGNOSIS — E1140 Type 2 diabetes mellitus with diabetic neuropathy, unspecified: Secondary | ICD-10-CM

## 2017-06-26 LAB — URINALYSIS, COMPLETE
BILIRUBIN UA: NEGATIVE
Glucose, UA: NEGATIVE
KETONES UA: NEGATIVE
Leukocytes, UA: NEGATIVE
NITRITE UA: NEGATIVE
PH UA: 7 (ref 5.0–7.5)
PROTEIN UA: NEGATIVE
RBC UA: NEGATIVE
SPEC GRAV UA: 1.01 (ref 1.005–1.030)
Urobilinogen, Ur: 0.2 mg/dL (ref 0.2–1.0)

## 2017-06-26 LAB — BAYER DCA HB A1C WAIVED: HB A1C: 5.6 % (ref <7.0)

## 2017-06-26 NOTE — Progress Notes (Signed)
Subjective:    Patient ID: [name] [name], male    DOB: 1965/09/18, 52 y.o.   MRN: [id number]  HPI Patient is here today for annual wellness exam and follow up of chronic medical problems which includes diabetes, hyperlipidemia and hypertension. He is taking medication regularly.  The patient continues to have ongoing neck pain.  He also complains of a knot in his chest.  He will get a PSA and lab work today and a urinalysis today.  His vital signs are stable and his weight is up a couple pounds since it was last checked.  The patient is doing well overall.  The neck problems that he has had have gotten worse.  He is now having neck pain not only down to the left front but also down to the left shoulder.  This is worse when he gets up in the morning and when he has been active physically.  He also complains of a lumpy area in his right sternal costal sternal border area.  He denies any trouble with his sstomach other than occasional bright red blood per rectum and there is no family history of colon cancer.  He did have a colonoscopy 10 years ago and will be due to get another one this year.  He denies any trouble passing his water.  He will get his yearly eye exam in [place].  The patient denies any anterior chest pain pressure or tightness.  The pain that he describes with his left shoulder seems to be related to turning of the neck and arising in the morning or sleeping on the bed pillow.  He denies any trouble with swallowing heartburn indigestion nausea vomiting diarrhea or black tarry stools.  He is passing his water without problems.   Patient Active Problem List   Diagnosis Date Noted  . Type 2 diabetes mellitus ([place]) 07/01/2013  . Dyslipidemia associated with type 2 diabetes mellitus ([place]) 07/01/2013  . Vitamin D deficiency 06/03/2013  . Chronic pain   . Hypertension   . RECTAL BLEEDING 01/22/2008   Outpatient Encounter Medications as of 06/26/2017  Medication Sig  . acetaminophen  (TYLENOL) 500 MG tablet Take 500 mg by mouth every 6 (six) hours as needed.  . cholecalciferol (VITAMIN D) 1000 UNITS tablet Take 1,000 Units by mouth daily. 5000  . fluticasone (FLONASE) 50 MCG/ACT nasal spray Place into both nostrils daily.  Marland Kitchen glucose blood test strip Use to check BG daily  . lisinopril-hydrochlorothiazide (PRINZIDE,ZESTORETIC) 20-25 MG tablet Take 1 tablet by mouth daily.  . metFORMIN (GLUCOPHAGE) 500 MG tablet Take 1 tablet (500 mg total) by mouth 2 (two) times daily with a meal.  . Multiple Vitamins-Minerals (MULTIVITAMIN WITH MINERALS) tablet Take 1 tablet by mouth daily.  [name] DELICA LANCETS 85Y MISC Use to check BG daily  . triamcinolone cream (KENALOG) 0.1 % Apply 1 application topically 2 (two) times daily.  . [DISCONTINUED] scopolamine (TRANSDERM-SCOP, 1.5 MG,) 1 MG/3DAYS Place 1 patch (1.5 mg total) onto the skin every 3 (three) days.  . [DISCONTINUED] scopolamine (TRANSDERM-SCOP, 1.5 MG,) 1 MG/3DAYS Place 1 patch (1.5 mg total) onto the skin every 3 (three) days.   No facility-administered encounter medications on file as of 06/26/2017.       Review of Systems  Constitutional: Negative.   HENT: Negative.   Eyes: Negative.   Respiratory: Negative.   Cardiovascular: Negative.   Gastrointestinal: Negative.   Endocrine: Negative.   Genitourinary: Negative.   Musculoskeletal: Positive for neck pain (on-going  neck pain).  Skin: Negative.        Knot / cyst on chest  Allergic/Immunologic: Negative.   Neurological: Negative.   Hematological: Negative.   Psychiatric/Behavioral: Negative.        Objective:   Physical Exam  Constitutional: He is oriented to person, place, and time. He appears well-developed and well-nourished.  The patient is pleasant and alert  HENT:  Head: Normocephalic and atraumatic.  Right Ear: External ear normal.  Left Ear: External ear normal.  Nose: Nose normal.  Mouth/Throat: Oropharynx is clear and moist. No oropharyngeal  exudate.  HEENT within normal limits  Eyes: Pupils are equal, round, and reactive to light. Conjunctivae and EOM are normal. Right eye exhibits no discharge. Left eye exhibits no discharge. No scleral icterus.  Eye exams are done yearly  Neck: Normal range of motion. Neck supple. No thyromegaly present.  No bruits thyromegaly or anterior cervical adenopathy  Cardiovascular: Normal rate, regular rhythm, normal heart sounds and intact distal pulses.  No murmur heard. Heart is regular at 60/min  Pulmonary/Chest: Effort normal and breath sounds normal. No respiratory distress. He has no wheezes. He has no rales. He exhibits no tenderness.  No axillary adenopathy and chest is clear anteriorly and posteriorly.  There is asymmetry of the chest wall with more prominence of the ribs at the right sternal border on the right than the left.  No masses were apparent.  Abdominal: Soft. Bowel sounds are normal. He exhibits no mass. There is no tenderness. There is no rebound and no guarding.  Genitourinary: Rectum normal and penis normal.  Genitourinary Comments: There is minimal enlargement of the prostate if any.  The external genitalia were within normal limits.  There were no hernias palpable.  The rectal exam was negative.  Musculoskeletal: Normal range of motion. He exhibits deformity. He exhibits no edema or tenderness.  Chest wall asymmetry right sternal border more prominent than left.  No soft tissue masses or breast masses.  Lymphadenopathy:    He has no cervical adenopathy.  Neurological: He is alert and oriented to person, place, and time. No cranial nerve deficit.  Right lower extremity reflexes were diminished on the right compared to the left.  Upper extremity reflexes were similar bilaterally.  Skin: Skin is warm and dry. No rash noted.  Psychiatric: He has a normal mood and affect. His behavior is normal. Judgment and thought content normal.  Nursing note and vitals reviewed.  BP 117/65  (BP Location: Left Arm)   Pulse (!) 59   Temp (!) 97.2 F (36.2 C) (Oral)   Ht 5' 9"  (1.753 m)   Wt 183 lb (83 kg)   BMI 27.02 kg/m         Assessment & Plan:   Problem List Items Addressed This Visit    Hypertension   Relevant Orders   BMP8+EGFR   CBC with Differential/Platelet   Hepatic function panel   Type 2 diabetes mellitus ([place])   Relevant Orders   CBC with Differential/Platelet   Bayer DCA Hb A1c Waived    Other Visit Diagnoses    Annual physical exam    -  Primary   Relevant Orders   BMP8+EGFR   CBC with Differential/Platelet   Lipid panel   PSA, total and free   VITAMIN D 25 Hydroxy (Vit-D Deficiency, Fractures)   Hepatic function panel   Thyroid Panel With TSH   Urinalysis, Complete   Pure hypercholesterolemia       Relevant Orders  CBC with Differential/Platelet   Lipid panel   Neck pain on left side       Family history of chronic ischemic heart disease       Cervicalgia       Screen for colon cancer         Patient Instructions  Continue current medications. Continue good therapeutic lifestyle changes which include good diet and exercise. Fall precautions discussed with patient. If an FOBT was given today- please return it to our front desk. If you are over 110 years old - you may need Prevnar 14 or the adult Pneumonia vaccine.  **Flu shots are available--- please call and schedule a FLU-CLINIC appointment**  After your visit with [place] today you will receive a survey in the mail or online from [employer] regarding your care with [place]. Please take a moment to fill this out. Your feedback is very important to [place] as you can help [place] better understand your patient needs as well as improve your experience and satisfaction. WE CARE ABOUT YOU!!!   Because of the ongoing and worsening neck pain on the left side radiating to the left shoulder and to the left neck we will arrange to get an MRI of the cervical spine Because of the family history of heart  disease we will also arrange to get a stress test Keep your eyes checked regularly You will be due to get a colonoscopy this year and with the bright red blood per rectum we will make sure that this gets arranged and a timely manner. Continue to watch diet closely and exercise regularly and keep weight down as much as possible Get eyes checked regularly  Arrie Senate MD

## 2017-06-26 NOTE — Patient Instructions (Addendum)
Continue current medications. Continue good therapeutic lifestyle changes which include good diet and exercise. Fall precautions discussed with patient. If an FOBT was given today- please return it to our front desk. If you are over 52 years old - you may need Prevnar 21 or the adult Pneumonia vaccine.  **Flu shots are available--- please call and schedule a FLU-CLINIC appointment**  After your visit with [place] today you will receive a survey in the mail or online from [employer] regarding your care with [place]. Please take a moment to fill this out. Your feedback is very important to [place] as you can help [place] better understand your patient needs as well as improve your experience and satisfaction. WE CARE ABOUT YOU!!!   Because of the ongoing and worsening neck pain on the left side radiating to the left shoulder and to the left neck we will arrange to get an MRI of the cervical spine Because of the family history of heart disease we will also arrange to get a stress test Keep your eyes checked regularly You will be due to get a colonoscopy this year and with the bright red blood per rectum we will make sure that this gets arranged and a timely manner. Continue to watch diet closely and exercise regularly and keep weight down as much as possible Get eyes checked regularly

## 2017-06-27 ENCOUNTER — Encounter: Payer: Self-pay | Admitting: Internal Medicine

## 2017-06-27 LAB — CBC WITH DIFFERENTIAL/PLATELET
BASOS: 1 %
Basophils Absolute: 0 10*3/uL (ref 0.0–0.2)
EOS (ABSOLUTE): 0.2 10*3/uL (ref 0.0–0.4)
EOS: 5 %
HEMATOCRIT: 41.1 % (ref 37.5–51.0)
HEMOGLOBIN: 14.2 g/dL (ref 13.0–17.7)
Immature Grans (Abs): 0 10*3/uL (ref 0.0–0.1)
Immature Granulocytes: 0 %
LYMPHS ABS: 2 10*3/uL (ref 0.7–3.1)
Lymphs: 38 %
MCH: 29.5 pg (ref 26.6–33.0)
MCHC: 34.5 g/dL (ref 31.5–35.7)
MCV: 85 fL (ref 79–97)
MONOCYTES: 13 %
Monocytes Absolute: 0.7 10*3/uL (ref 0.1–0.9)
NEUTROS ABS: 2.3 10*3/uL (ref 1.4–7.0)
Neutrophils: 43 %
Platelets: 357 10*3/uL (ref 150–379)
RBC: 4.81 x10E6/uL (ref 4.14–5.80)
RDW: 13.9 % (ref 12.3–15.4)
WBC: 5.2 10*3/uL (ref 3.4–10.8)

## 2017-06-27 LAB — PSA, TOTAL AND FREE
PROSTATE SPECIFIC AG, SERUM: 0.7 ng/mL (ref 0.0–4.0)
PSA, Free Pct: 22.9 %
PSA, Free: 0.16 ng/mL

## 2017-06-27 LAB — LIPID PANEL
CHOL/HDL RATIO: 3 ratio (ref 0.0–5.0)
Cholesterol, Total: 166 mg/dL (ref 100–199)
HDL: 56 mg/dL (ref >39)
LDL CALC: 102 mg/dL — AB (ref 0–99)
Triglycerides: 42 mg/dL (ref 0–149)
VLDL Cholesterol Cal: 8 mg/dL (ref 5–40)

## 2017-06-27 LAB — THYROID PANEL WITH TSH
Free Thyroxine Index: 1.9 (ref 1.2–4.9)
T3 Uptake Ratio: 22 % — ABNORMAL LOW (ref 24–39)
T4, Total: 8.5 ug/dL (ref 4.5–12.0)
TSH: 2.27 u[IU]/mL (ref 0.450–4.500)

## 2017-06-27 LAB — VITAMIN D 25 HYDROXY (VIT D DEFICIENCY, FRACTURES): Vit D, 25-Hydroxy: 67.4 ng/mL (ref 30.0–100.0)

## 2017-06-27 LAB — HEPATIC FUNCTION PANEL
ALBUMIN: 4.7 g/dL (ref 3.5–5.5)
ALK PHOS: 42 IU/L (ref 39–117)
ALT: 20 IU/L (ref 0–44)
AST: 21 IU/L (ref 0–40)
BILIRUBIN, DIRECT: 0.13 mg/dL (ref 0.00–0.40)
Bilirubin Total: 0.4 mg/dL (ref 0.0–1.2)
TOTAL PROTEIN: 7.1 g/dL (ref 6.0–8.5)

## 2017-06-27 LAB — BMP8+EGFR
BUN / CREAT RATIO: 24 — AB (ref 9–20)
BUN: 23 mg/dL (ref 6–24)
CO2: 26 mmol/L (ref 20–29)
CREATININE: 0.97 mg/dL (ref 0.76–1.27)
Calcium: 9.9 mg/dL (ref 8.7–10.2)
Chloride: 97 mmol/L (ref 96–106)
GFR calc non Af Amer: 90 mL/min/{1.73_m2} (ref >59)
GFR, EST AFRICAN AMERICAN: 104 mL/min/{1.73_m2} (ref >59)
GLUCOSE: 98 mg/dL (ref 65–99)
Potassium: 4.4 mmol/L (ref 3.5–5.2)
SODIUM: 138 mmol/L (ref 134–144)

## 2017-06-28 ENCOUNTER — Telehealth: Payer: Self-pay | Admitting: *Deleted

## 2017-06-28 DIAGNOSIS — E7800 Pure hypercholesterolemia, unspecified: Secondary | ICD-10-CM

## 2017-06-28 DIAGNOSIS — L03119 Cellulitis of unspecified part of limb: Secondary | ICD-10-CM

## 2017-06-28 DIAGNOSIS — E119 Type 2 diabetes mellitus without complications: Secondary | ICD-10-CM

## 2017-06-28 DIAGNOSIS — I10 Essential (primary) hypertension: Secondary | ICD-10-CM

## 2017-06-28 DIAGNOSIS — Z8249 Family history of ischemic heart disease and other diseases of the circulatory system: Secondary | ICD-10-CM

## 2017-06-28 NOTE — Telephone Encounter (Signed)
Please review for stress test

## 2017-06-28 NOTE — Telephone Encounter (Signed)
Patient is probably too high risk for me because he has both diabetes and blood pressure and cholesterol.  Please refer to cardiology for stress testing

## 2017-06-29 NOTE — Telephone Encounter (Signed)
Dr. Warrick Parisian states that patient is to high risk would you like to send to cardiology?

## 2017-06-30 ENCOUNTER — Encounter: Payer: Self-pay | Admitting: Family Medicine

## 2017-06-30 ENCOUNTER — Ambulatory Visit: Payer: 59 | Admitting: Family Medicine

## 2017-06-30 VITALS — BP 126/68 | HR 71 | Temp 97.3°F | Ht 69.0 in | Wt 186.6 lb

## 2017-06-30 DIAGNOSIS — L03119 Cellulitis of unspecified part of limb: Secondary | ICD-10-CM

## 2017-06-30 MED ORDER — CLINDAMYCIN HCL 300 MG PO CAPS: 300.0000 mg | ORAL_CAPSULE | Freq: Four times a day (QID) | ORAL | 0 refills | Status: DC

## 2017-06-30 NOTE — Patient Instructions (Signed)
Great to see you!   Cellulitis, Adult Cellulitis is a skin infection. The infected area is usually red and sore. This condition occurs most often in the arms and lower legs. It is very important to get treated for this condition. Follow these instructions at home:  Take over-the-counter and prescription medicines only as told by your doctor.  If you were prescribed an antibiotic medicine, take it as told by your doctor. Do not stop taking the antibiotic even if you start to feel better.  Drink enough fluid to keep your pee (urine) clear or pale yellow.  Do not touch or rub the infected area.  Raise (elevate) the infected area above the level of your heart while you are sitting or lying down.  Place warm or cold wet cloths (warm or cold compresses) on the infected area. Do this as told by your doctor.  Keep all follow-up visits as told by your doctor. This is important. These visits let your doctor make sure your infection is not getting worse. Contact a doctor if:  You have a fever.  Your symptoms do not get better after 1-2 days of treatment.  Your bone or joint under the infected area starts to hurt after the skin has healed.  Your infection comes back. This can happen in the same area or another area.  You have a swollen bump in the infected area.  You have new symptoms.  You feel ill and also have muscle aches and pains. Get help right away if:  Your symptoms get worse.  You feel very sleepy.  You throw up (vomit) or have watery poop (diarrhea) for a long time.  There are red streaks coming from the infected area.  Your red area gets larger.  Your red area turns darker. This information is not intended to replace advice given to you by your health care provider. Make sure you discuss any questions you have with your health care provider. Document Released: 09/07/2007 Document Revised: 08/27/2015 Document Reviewed: 01/28/2015 Elsevier Interactive Patient Education   2018 Elsevier Inc.  

## 2017-06-30 NOTE — Progress Notes (Signed)
   HPI  Patient presents today here for swelling and pain of the right elbow.  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 It had formed a scab and been healing as expected until this morning when he woke up with a red warm painful area around the abrasion. Patient states he has not had any severe trauma or obvious reasons to have this severe of a reaction.  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   PMH: Smoking status noted ROS: Per HPI  Objective: BP 126/68   Pulse 71   Temp (!) 97.3 F (36.3 C) (Oral)   Ht 5\' 9"  (1.753 m)   Wt 186 lb 9.6 oz (84.6 kg)   BMI 27.56 kg/m  Gen: NAD, alert, cooperative with exam HEENT: NCAT CV: RRR, good S1/S2, no murmur Resp: CTABL, no wheezes, non-labored Ext: No edema, warm Neuro: Alert and oriented, No gross deficits  Skin Right elbow with erythema surrounding a shallow abrasion approximately 1 cm in diameter, the area of erythema measures 8 cm x 11-1/2 cm, there is fluctuance, however no induration, there is warmth, no fluid expressed.  Assessment and plan:  # cellulitis of the elbow Erythema and fluctuance around the elbow with recent abrasion Treating with clindamycin Patient may have deep abscess, however there is no significant induration, he does have fluctuance that is more consistent with olecranon bursitis and overlying cellulitis. Recommended ice for the bursitis, clindamycin for likely cellulitis.    Meds ordered this encounter  Medications  . clindamycin (CLEOCIN) 300 MG capsule    Sig: Take 1 capsule (300 mg total) by mouth 4 (four) times daily.    Dispense:  28 capsule    Refill:  [place], MD [facility] 06/30/2017, 4:26 PM

## 2017-06-30 NOTE — Telephone Encounter (Signed)
Please refer this patient to the cardiologist for routine ETT due to strong family history of heart disease and increased risk factors.  Dr. Percival Spanish.

## 2017-07-03 DIAGNOSIS — M25522 Pain in left elbow: Secondary | ICD-10-CM | POA: Diagnosis not present

## 2017-07-03 DIAGNOSIS — M7022 Olecranon bursitis, left elbow: Secondary | ICD-10-CM | POA: Diagnosis not present

## 2017-07-03 NOTE — Telephone Encounter (Signed)
Inflammation is not going down - he will finish antibiotic on Friday He thinks it should be better by now. Do you think you should see it again?  Still has some pain if he hits it - this is some better

## 2017-07-03 NOTE — Telephone Encounter (Signed)
I would recommend urgent referral to orthopedics to consider possible I&D. Referral was placed. Will discuss with referrals coordinator.  Laroy Apple, MD [facility] 07/03/2017, 11:45 AM

## 2017-07-03 NOTE — Telephone Encounter (Signed)
Called and discussed, may need I&D. Too close to olecrenon bursa for me to I&D.   Laroy Apple, MD [facility] 07/03/2017, 12:07 PM

## 2017-07-03 NOTE — Telephone Encounter (Signed)
Patient aware of recommendation. Patient is confused as to why he would go to Ortho for Cellulitis? Please review and advise.

## 2017-07-04 ENCOUNTER — Telehealth: Payer: Self-pay | Admitting: *Deleted

## 2017-07-04 ENCOUNTER — Telehealth: Payer: Self-pay | Admitting: Family Medicine

## 2017-07-04 MED ORDER — CLINDAMYCIN HCL 300 MG PO CAPS
300.0000 mg | ORAL_CAPSULE | Freq: Four times a day (QID) | ORAL | 0 refills | Status: DC
Start: 2017-07-04 — End: 2017-08-04

## 2017-07-04 NOTE — Telephone Encounter (Signed)
Have extended the course of clindamycin, I had recommended Ortho referral, will ask nursing to check in on that.   Laroy Apple, MD [facility] 07/04/2017, 4:01 PM

## 2017-07-04 NOTE — Telephone Encounter (Signed)
Pt notified of RX Will call back for appt if sxs persist or worsen

## 2017-07-04 NOTE — Telephone Encounter (Signed)
Per pt arm is not any better Pt leaves to go out of town in the AM Pt is concerned because he will not have enough antibiotics to get thru the weekend Please advise

## 2017-07-07 ENCOUNTER — Ambulatory Visit (HOSPITAL_COMMUNITY): Payer: Self-pay

## 2017-07-10 ENCOUNTER — Ambulatory Visit (HOSPITAL_COMMUNITY): Payer: 59

## 2017-07-10 DIAGNOSIS — M7021 Olecranon bursitis, right elbow: Secondary | ICD-10-CM | POA: Diagnosis not present

## 2017-07-25 ENCOUNTER — Other Ambulatory Visit: Payer: Self-pay

## 2017-07-25 DIAGNOSIS — M542 Cervicalgia: Secondary | ICD-10-CM

## 2017-08-04 ENCOUNTER — Ambulatory Visit: Payer: 59 | Admitting: Cardiovascular Disease

## 2017-08-04 ENCOUNTER — Encounter: Payer: Self-pay | Admitting: Cardiovascular Disease

## 2017-08-04 VITALS — BP 116/80 | HR 54 | Ht 70.0 in | Wt 186.0 lb

## 2017-08-04 DIAGNOSIS — I10 Essential (primary) hypertension: Secondary | ICD-10-CM | POA: Diagnosis not present

## 2017-08-04 DIAGNOSIS — E1169 Type 2 diabetes mellitus with other specified complication: Secondary | ICD-10-CM

## 2017-08-04 DIAGNOSIS — E785 Hyperlipidemia, unspecified: Secondary | ICD-10-CM

## 2017-08-04 DIAGNOSIS — Z8249 Family history of ischemic heart disease and other diseases of the circulatory system: Secondary | ICD-10-CM | POA: Diagnosis not present

## 2017-08-04 NOTE — Assessment & Plan Note (Signed)
History of essential hypertension blood pressure measures 116/80. He is on lisinopril and hydrochlorothiazide. Continue current meds at current dosing

## 2017-08-04 NOTE — Patient Instructions (Signed)
Medication Instructions: Your physician recommends that you continue on your current medications as directed. Please refer to the Current Medication list given to you today.  Testing/Procedures: Your physician has requested that you have an exercise tolerance test. For further information please visit [email or web address]. Please also follow instruction sheet, as given.  Follow-Up: Your physician recommends that you schedule a follow-up appointment as needed with Dr. Berry.    

## 2017-08-04 NOTE — Progress Notes (Signed)
08/04/2017 [name] [name]   May 22, 1965  [id number]  Primary Physician Chipper Herb, MD Primary Cardiologist: Lorretta Harp MD [name], [place]  HPI:  [name] [name] is a 52 y.o.  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 He was referred by Dr. Laurance Flatten for routine GXT. He does have a history of treated diabetes and hypertension. He has no other cardiac risk factors. He denies chest pain or shortness of breath. There is no family history of heart disease. Never had a heart attack or stroke. He did have a GXT 10 years ago which was apparently normal and was referred here for a follow-up GXT given his risk factor profile.   Current Meds  Medication Sig  . acetaminophen (TYLENOL) 500 MG tablet Take 500 mg by mouth every 6 (six) hours as needed.  . cholecalciferol (VITAMIN D) 1000 UNITS tablet Take 1,000 Units by mouth daily. 5000  . fluticasone (FLONASE) 50 MCG/ACT nasal spray Place into both nostrils daily.  Marland Kitchen glucose blood test strip Use to check BG daily  . lisinopril-hydrochlorothiazide (PRINZIDE,ZESTORETIC) 20-25 MG tablet Take 1 tablet by mouth daily.  . metFORMIN (GLUCOPHAGE) 500 MG tablet Take 1 tablet (500 mg total) by mouth 2 (two) times daily with a meal.  . Multiple Vitamins-Minerals (MULTIVITAMIN WITH MINERALS) tablet Take 1 tablet by mouth daily.  [name] DELICA LANCETS 70J MISC Use to check BG daily  . triamcinolone cream (KENALOG) 0.1 % Apply 1 application topically 2 (two) times daily.     No Known Allergies  Social History   Socioeconomic History  . Marital status: Married    Spouse name: Not on file  . Number of children: Not on file  . Years of education: Not on file  . Highest education level: Not on file  Occupational History  . Not on file  Social Needs  . Financial resource strain: Not on file  . Food insecurity:    Worry: Not on file    Inability: Not on file  . Transportation  needs:    Medical: Not on file    Non-medical: Not on file  Tobacco Use  . Smoking status: Never Smoker  . Smokeless tobacco: Never Used  Substance and Sexual Activity  . Alcohol use: No  . Drug use: No  . Sexual activity: Not on file  Lifestyle  . Physical activity:    Days per week: Not on file    Minutes per session: Not on file  . Stress: Not on file  Relationships  . Social connections:    Talks on phone: Not on file    Gets together: Not on file    Attends religious service: Not on file    Active member of club or organization: Not on file    Attends meetings of clubs or organizations: Not on file    Relationship status: Not on file  . Intimate partner violence:    Fear of current or ex partner: Not on file    Emotionally abused: Not on file    Physically abused: Not on file    Forced sexual activity: Not on file  Other Topics Concern  . Not on file  Social History Narrative  . Not on file     Review of Systems: General: negative for chills, fever, night sweats or weight changes.  Cardiovascular: negative for chest pain, dyspnea on exertion, edema, orthopnea, palpitations, paroxysmal nocturnal  dyspnea or shortness of breath Dermatological: negative for rash Respiratory: negative for cough or wheezing Urologic: negative for hematuria Abdominal: negative for nausea, vomiting, diarrhea, bright red blood per rectum, melena, or hematemesis Neurologic: negative for visual changes, syncope, or dizziness All other systems reviewed and are otherwise negative except as noted above.    Blood pressure 116/80, pulse (!) 54, height 5\' 10"  (1.778 m), weight 186 lb (84.4 kg).  General appearance: alert and no distress Neck: no adenopathy, no carotid bruit, no JVD, supple, symmetrical, trachea midline and thyroid not enlarged, symmetric, no tenderness/mass/nodules Lungs: clear to auscultation bilaterally Heart: regular rate and rhythm, S1, S2 normal, no murmur, click, rub or  gallop Extremities: extremities normal, atraumatic, no cyanosis or edema Pulses: 2+ and symmetric Skin: Skin color, texture, turgor normal. No rashes or lesions Neurologic: Alert and oriented X 3, normal strength and tone. Normal symmetric reflexes. Normal coordination and gait  EKG sinus bradycardia 54 without ST or T-wave changes. I personally reviewed this EKG.  ASSESSMENT AND PLAN:   Chronic pain History of essential hypertension blood pressure measured today at 116/80. He is on; hydrochlorothiazide. Continue current meds at current dosing  Hypertension History of essential hypertension blood pressure measures 116/80. He is on lisinopril and hydrochlorothiazide. Continue current meds at current dosing  Dyslipidemia associated with type 2 diabetes mellitus ([place]) History of dyslipidemia which is mild not on statin therapy      Lorretta Harp MD [facility] 08/04/2017 10:54 AM

## 2017-08-04 NOTE — Assessment & Plan Note (Signed)
History of essential hypertension blood pressure measured today at 116/80. He is on; hydrochlorothiazide. Continue current meds at current dosing

## 2017-08-04 NOTE — Assessment & Plan Note (Signed)
History of dyslipidemia which is mild not on statin therapy

## 2017-08-10 ENCOUNTER — Telehealth (HOSPITAL_COMMUNITY): Payer: Self-pay

## 2017-08-10 ENCOUNTER — Ambulatory Visit
Admission: RE | Admit: 2017-08-10 | Discharge: 2017-08-10 | Disposition: A | Discharge status: Home / Self Care | Payer: 59 | Source: Ambulatory Visit | Attending: Family Medicine | Admitting: Family Medicine

## 2017-08-10 DIAGNOSIS — M542 Cervicalgia: Secondary | ICD-10-CM

## 2017-08-10 DIAGNOSIS — M4802 Spinal stenosis, cervical region: Secondary | ICD-10-CM | POA: Diagnosis not present

## 2017-08-10 NOTE — Telephone Encounter (Signed)
Encounter complete. 

## 2017-08-11 ENCOUNTER — Telehealth (HOSPITAL_COMMUNITY): Payer: Self-pay

## 2017-08-11 NOTE — Telephone Encounter (Signed)
Encounter complete. 

## 2017-08-15 ENCOUNTER — Telehealth: Payer: Self-pay | Admitting: Internal Medicine

## 2017-08-15 ENCOUNTER — Ambulatory Visit (HOSPITAL_COMMUNITY)
Admission: RE | Admit: 2017-08-15 | Discharge: 2017-08-15 | Disposition: A | Discharge status: Home / Self Care | Payer: 59 | Source: Ambulatory Visit | Attending: Cardiovascular Disease | Admitting: Cardiovascular Disease

## 2017-08-15 DIAGNOSIS — Z8249 Family history of ischemic heart disease and other diseases of the circulatory system: Secondary | ICD-10-CM

## 2017-08-15 LAB — EXERCISE TOLERANCE TEST
CHL CUP MPHR: 169 {beats}/min
CSEPEDS: 36 s
CSEPHR: 101 %
CSEPPHR: 171 {beats}/min
Estimated workload: 14.2 METS
Exercise duration (min): 12 min
RPE: 17
Rest HR: 60 {beats}/min

## 2017-08-15 NOTE — Telephone Encounter (Signed)
Would 1st need to be approved by Dr. Ardis Hughs

## 2017-08-15 NOTE — Telephone Encounter (Signed)
OK with me.

## 2017-08-15 NOTE — Telephone Encounter (Signed)
Hi Dr. Hilarie Fredrickson, this pt needs a repeat colon. His previous colon was performed by Dr. Ardis Hughs in 2009. Pt would like to switch his care over to you per his PCP recommendation. Are you ok with that? Thank you.

## 2017-08-17 NOTE — Telephone Encounter (Signed)
ok 

## 2017-08-25 ENCOUNTER — Encounter: Payer: Self-pay | Admitting: *Deleted

## 2017-08-30 ENCOUNTER — Other Ambulatory Visit: Payer: Self-pay | Admitting: *Deleted

## 2017-08-30 DIAGNOSIS — M542 Cervicalgia: Secondary | ICD-10-CM

## 2017-08-30 DIAGNOSIS — M5030 Other cervical disc degeneration, unspecified cervical region: Secondary | ICD-10-CM

## 2017-08-30 DIAGNOSIS — M4802 Spinal stenosis, cervical region: Secondary | ICD-10-CM

## 2017-09-07 ENCOUNTER — Encounter (INDEPENDENT_AMBULATORY_CARE_PROVIDER_SITE_OTHER): Payer: Self-pay

## 2017-09-07 ENCOUNTER — Ambulatory Visit: Payer: 59 | Admitting: Internal Medicine

## 2017-09-07 ENCOUNTER — Telehealth: Payer: Self-pay | Admitting: *Deleted

## 2017-09-07 ENCOUNTER — Encounter: Payer: Self-pay | Admitting: Internal Medicine

## 2017-09-07 VITALS — BP 136/70 | HR 84 | Ht 70.0 in | Wt 184.0 lb

## 2017-09-07 DIAGNOSIS — K648 Other hemorrhoids: Secondary | ICD-10-CM | POA: Diagnosis not present

## 2017-09-07 DIAGNOSIS — R109 Unspecified abdominal pain: Secondary | ICD-10-CM | POA: Diagnosis not present

## 2017-09-07 DIAGNOSIS — Z1211 Encounter for screening for malignant neoplasm of colon: Secondary | ICD-10-CM | POA: Diagnosis not present

## 2017-09-07 MED ORDER — SUPREP BOWEL PREP KIT 17.5-3.13-1.6 GM/177ML PO SOLN: 1.0000 | ORAL | 0 refills | Status: DC

## 2017-09-07 NOTE — Progress Notes (Signed)
Patient ID: [name] [name], male   DOB: [birth year], 52 y.o.   MRN: [id number] HPI: [name] [name] is a 53 year old male with a history of internal hemorrhoids, hypertension, diabetes who is seen in consultation at the request of Dr. Laurance Flatten and Dr. Warrick Parisian to consider colon cancer screening, discuss internal hemorrhoids and also with mention of upper abdominal pain.  He is here alone today.  10 years ago he had a colonoscopy to evaluate rectal bleeding with Dr. Ardis Hughs.  This was normal except for internal hemorrhoids.  He reports that intermittently he will see painless red blood with wiping.  Some perianal bulging symptoms when he is bathing and cleaning himself.  Bowel movements are once per day without constipation or diarrhea.  Bleeding occurs maybe once per month.  Seems to be worse if he has been physically active doing activities such as yard work.  About a month ago he felt a "tearing sensation" in his right upper epigastrium.  He was not working out or doing physical activity at the time.  This was persistent with movement and activity for over a week and is still somewhat sore.  It is right upper quadrant but also in the midepigastrium.  Hurts when he is having a bowel movement and Valsalva's or activates his abdominal muscles.  No nausea, early satiety, heartburn, association with eating.  No weight loss.  Has happened several times through the years.  No family history of colon cancer.  He works for [employer].  No tobacco use.  Past Medical History:  Diagnosis Date  . Chronic pain   . Dental crown present   . Diabetes mellitus without complication ([place])   . Hypertension    no meds  . Internal hemorrhoids     Past Surgical History:  Procedure Laterality Date  . CARPAL TUNNEL RELEASE Right 03/06/2014   Procedure: RIGHT CARPAL TUNNEL RELEASE;  Surgeon: Daryll Brod, MD;  Location: [facility];  Service: Orthopedics;  Laterality: Right;  . COLONOSCOPY    . GANGLION  CYST EXCISION  2009   rt wrist  . GANGLION CYST EXCISION Left 01/17/2013   Procedure: EXCISION VOLAR MYXOID CYST LEFT WRIST;  Surgeon: Cammie Sickle., MD;  Location: [facility];  Service: Orthopedics;  Laterality: Left;  . Right Bicep surgery  2008   Radial nerve and cyst removal-bicep rupture  . Right shoulder surgery  2010  . SHOULDER ARTHROSCOPY  98,09   right    Outpatient Medications Prior to Visit  Medication Sig Dispense Refill  . acetaminophen (TYLENOL) 500 MG tablet Take 500 mg by mouth every 6 (six) hours as needed.    . cholecalciferol (VITAMIN D) 1000 UNITS tablet Take 1,000 Units by mouth daily. 5000    . fluticasone (FLONASE) 50 MCG/ACT nasal spray Place into both nostrils daily.    Marland Kitchen glucose blood test strip Use to check BG daily 100 each 3  . lisinopril-hydrochlorothiazide (PRINZIDE,ZESTORETIC) 20-25 MG tablet Take 1 tablet by mouth daily. 90 tablet 3  . metFORMIN (GLUCOPHAGE) 500 MG tablet Take 1 tablet (500 mg total) by mouth 2 (two) times daily with a meal. 180 tablet 0  . Multiple Vitamins-Minerals (MULTIVITAMIN WITH MINERALS) tablet Take 1 tablet by mouth daily.    [name] DELICA LANCETS 76E MISC Use to check BG daily 100 each 3  . triamcinolone cream (KENALOG) 0.1 % Apply 1 application topically 2 (two) times daily. 80 g 2   No facility-administered medications prior to  visit.     No Known Allergies  Family History  Problem Relation Age of Onset  . Heart disease Mother   . Diabetes Mother   . Stroke Mother   . Kidney cancer Father     Social History   Tobacco Use  . Smoking status: Never Smoker  . Smokeless tobacco: Never Used  Substance Use Topics  . Alcohol use: No  . Drug use: No    ROS: As per history of present illness, otherwise negative  BP 136/70   Pulse 84   Ht 5\' 10"  (1.778 m)   Wt 184 lb (83.5 kg)   BMI 26.40 kg/m  Constitutional: Well-developed and well-nourished. No distress. HEENT: Normocephalic and  atraumatic. Oropharynx is clear and moist. Conjunctivae are normal.  No scleral icterus. Neck: Neck supple. Trachea midline. Cardiovascular: Normal rate, regular rhythm and intact distal pulses. No M/R/G Pulmonary/chest: Effort normal and breath sounds normal. No wheezing, rales or rhonchi. Abdominal: Soft, nontender, nondistended. Bowel sounds active throughout. There are no masses palpable. No hepatosplenomegaly.  Negative Carnett sign Extremities: no clubbing, cyanosis, or edema Neurological: Alert and oriented to person place and time. Skin: Skin is warm and dry.  Psychiatric: Normal mood and affect. Behavior is normal.  RELEVANT LABS AND IMAGING: CBC    Component Value Date/Time   WBC 5.2 06/26/2017 1041   WBC 5.9 06/05/2014 1211   WBC 8.1 01/22/2008 1347   RBC 4.81 06/26/2017 1041   RBC 5.29 06/05/2014 1211   RBC 4.96 01/22/2008 1347   HGB 14.2 06/26/2017 1041   HCT 41.1 06/26/2017 1041   PLT 357 06/26/2017 1041   MCV 85 06/26/2017 1041   MCH 29.5 06/26/2017 1041   MCH 26.9 (A) 06/05/2014 1211   MCHC 34.5 06/26/2017 1041   MCHC 30.4 (A) 06/05/2014 1211   MCHC 35.2 01/22/2008 1347   RDW 13.9 06/26/2017 1041   LYMPHSABS 2.0 06/26/2017 1041   MONOABS 0.9 01/22/2008 1347   EOSABS 0.2 06/26/2017 1041   BASOSABS 0.0 06/26/2017 1041    CMP     Component Value Date/Time   NA 138 06/26/2017 1041   K 4.4 06/26/2017 1041   CL 97 06/26/2017 1041   CO2 26 06/26/2017 1041   GLUCOSE 98 06/26/2017 1041   GLUCOSE 113 (H) 03/06/2014 1131   BUN 23 06/26/2017 1041   CREATININE 0.97 06/26/2017 1041   CREATININE 0.89 02/08/2013 1602   CALCIUM 9.9 06/26/2017 1041   PROT 7.1 06/26/2017 1041   ALBUMIN 4.7 06/26/2017 1041   AST 21 06/26/2017 1041   ALT 20 06/26/2017 1041   ALKPHOS 42 06/26/2017 1041   BILITOT 0.4 06/26/2017 1041   GFRNONAA 90 06/26/2017 1041   GFRAA 104 06/26/2017 1041    ASSESSMENT/PLAN: 52 year old male with a history of internal hemorrhoids, hypertension,  diabetes who is seen in consultation at the request of Dr. Laurance Flatten to consider colon cancer screening, discuss internal hemorrhoids and also with mention of upper abdominal pain.  1. CRC screening --colonoscopy recommended for screening based on age of 16 years.  No family history so he is average risk.  We discussed the risk, benefits and alternatives and he is agreeable and wishes to proceed.  2.  Internal hemorrhoids with bleeding and mild prolapse --very infrequent painless rectal bleeding likely consistent with the known hemorrhoids that he has.  These were seen 10 years ago when he had colonoscopy for bleeding.  He is not having constipation or diarrhea.  We will evaluate hemorrhoids at the  time of screening colonoscopy.  We discussed hemorrhoidal banding and once colonoscopy is performed I can better inform him as to if this would be a good option for treatment.  3.  Abdominal wall pain --his pain sounds musculoskeletal in nature and not visceral pain.  I recommended he be evaluated by Dr. Tamala Julian in our sports medicine clinic.  If after this evaluation Dr. Tamala Julian does not feel this is musculoskeletal we can perform additional imaging such as abdominal ultrasound or even CT scan.  Pain is improving with rest.     BE:[id number], [address], [place], [place] [id number]   Dr. Redge Gainer

## 2017-09-07 NOTE — Patient Instructions (Signed)
You have been scheduled for a colonoscopy. Please follow written instructions given to you at your visit today.  Please pick up your prep supplies at the pharmacy within the next 1-3 days. If you use inhalers (even only as needed), please bring them with you on the day of your procedure. Your physician has requested that you go to www.startemmi.com and enter the access code given to you at your visit today. This web site gives a general overview about your procedure. However, you should still follow specific instructions given to you by our office regarding your preparation for the procedure.  You will be scheduled for an appointment with Dr Charlann Boxer at [facility] (1st floor). We will contact you once that appointment information has become available.  If you are age 68 or older, your body mass index should be between 23-30. Your Body mass index is 26.4 kg/m. If this is out of the aforementioned range listed, please consider follow up with your Primary Care Provider.  If you are age 45 or younger, your body mass index should be between 19-25. Your Body mass index is 26.4 kg/m. If this is out of the aformentioned range listed, please consider follow up with your Primary Care Provider.

## 2017-09-07 NOTE — Telephone Encounter (Signed)
Patient is scheduled for an appointment with Dr Charlann Boxer on Monday, 09/11/17 at 11:30 am (11:15 am arrival). I have left a voicemail for him to call back so I can advise him of this information.

## 2017-09-08 ENCOUNTER — Ambulatory Visit (INDEPENDENT_AMBULATORY_CARE_PROVIDER_SITE_OTHER): Payer: 59 | Admitting: Family Medicine

## 2017-09-08 ENCOUNTER — Encounter: Payer: Self-pay | Admitting: Family Medicine

## 2017-09-08 ENCOUNTER — Other Ambulatory Visit: Payer: Self-pay | Admitting: Family Medicine

## 2017-09-08 ENCOUNTER — Encounter: Payer: Self-pay | Admitting: Internal Medicine

## 2017-09-08 VITALS — BP 137/77 | HR 61 | Temp 96.7°F | Ht 70.0 in | Wt 186.0 lb

## 2017-09-08 DIAGNOSIS — R1011 Right upper quadrant pain: Secondary | ICD-10-CM | POA: Diagnosis not present

## 2017-09-08 DIAGNOSIS — R300 Dysuria: Secondary | ICD-10-CM | POA: Diagnosis not present

## 2017-09-08 LAB — URINALYSIS, COMPLETE
Bilirubin, UA: NEGATIVE
Glucose, UA: NEGATIVE
Ketones, UA: NEGATIVE
Leukocytes, UA: NEGATIVE
Nitrite, UA: NEGATIVE
Protein, UA: NEGATIVE
RBC, UA: NEGATIVE
Specific Gravity, UA: 1.02 (ref 1.005–1.030)
Urobilinogen, Ur: 0.2 mg/dL (ref 0.2–1.0)
pH, UA: 6.5 (ref 5.0–7.5)

## 2017-09-08 LAB — MICROSCOPIC EXAMINATION

## 2017-09-08 NOTE — Progress Notes (Signed)
   HPI  Patient presents today for dysuria as well as abdominal wall pain.  Dysuria Patient explains he has had dysuria over the last 5 days, he started drinking more water and is been improved. He denies penile discharge, new sexual partners. He denies fever, chills, sweats.  Right upper quadrant pain. Started about 1 month ago after holding his head up while laying on the beach for 30 to 45 minutes.  He states it started with a "tearing sensation".  Now is persistent only as a mild dull right upper quadrant pain that is worse with engaging his abdominal wall muscles. No worsening after eating. No early satiety. No fever  PMH: Smoking status noted ROS: Per HPI  Objective: BP 137/77   Pulse 61   Temp (!) 96.7 F (35.9 C) (Oral)   Ht 5\' 10"  (1.778 m)   Wt 186 lb (84.4 kg)   BMI 26.69 kg/m  Gen: NAD, alert, cooperative with exam HEENT: NCAT CV: RRR, good S1/S2, no murmur Resp: CTABL, no wheezes, non-labored Abd:, Positive bowel sounds, tenderness to palpation over right upper quadrant, negative Murphy sign Ext: No edema, warm Neuro: Alert and oriented, No gross deficits  Assessment and plan:  #Dysuria No signs of UTI on urinalysis, culture pending After more discussion patient is concerned about STI, added GCC Does have low risk sexual behavior.  #Right upper quadrant pain I agree with GI, likely musculoskeletal pain, appreciate Dr. Edwyna Ready Smith's opinion.  He has an appointment in 3 days. No red flags  Orders Placed This Encounter  Procedures  . Urine Culture  . GC/Chlamydia Probe Amp([facility])    Order Specific Question:   Source    Answer:   urine  . Urinalysis, Complete     Laroy Apple, MD [facility] 09/08/2017, 10:16 AM

## 2017-09-08 NOTE — Telephone Encounter (Signed)
Patient advised of appointment time/date/location and verbalizes understanding.

## 2017-09-08 NOTE — Patient Instructions (Signed)
Great to see you!  We will culture your urine, at this time it does not look like a UTI.   I am glad you are being Evaluated by Dr. Tamala Julian, I think you will do very well with him.

## 2017-09-09 LAB — URINE CULTURE: Organism ID, Bacteria: NO GROWTH

## 2017-09-09 NOTE — Progress Notes (Signed)
[facility] [address] [place], [place] [address] Phone: [phone] Subjective:     CC: Abdominal wall pain  UXN:[id number]  [name] [name] is a 52 y.o. male coming in with complaint of abdomen wall pain. TTP. States this has happened before and it usually eases off on its own.   Onset- Chronic Location- Abdomen Character- Sharp, tender pain, "sticking knives"  Aggravating factors- Pain with motion  Therapies tried- Advil  Severity-7 out of 10     Past Medical History:  Diagnosis Date  . Chronic pain   . Dental crown present   . Diabetes mellitus without complication ([place])   . Hypertension    no meds  . Internal hemorrhoids    Past Surgical History:  Procedure Laterality Date  . CARPAL TUNNEL RELEASE Right 03/06/2014   Procedure: RIGHT CARPAL TUNNEL RELEASE;  Surgeon: Daryll Brod, MD;  Location: [facility];  Service: Orthopedics;  Laterality: Right;  . COLONOSCOPY    . GANGLION CYST EXCISION  2009   rt wrist  . GANGLION CYST EXCISION Left 01/17/2013   Procedure: EXCISION VOLAR MYXOID CYST LEFT WRIST;  Surgeon: Cammie Sickle., MD;  Location: [facility];  Service: Orthopedics;  Laterality: Left;  . Right Bicep surgery  2008   Radial nerve and cyst removal-bicep rupture  . Right shoulder surgery  2010  . SHOULDER ARTHROSCOPY  98,09   right   Social History   Socioeconomic History  . Marital status: Married    Spouse name: Not on file  . Number of children: Not on file  . Years of education: Not on file  . Highest education level: Not on file  Occupational History  . Not on file  Social Needs  . Financial resource strain: Not on file  . Food insecurity:    Worry: Not on file    Inability: Not on file  . Transportation needs:    Medical: Not on file    Non-medical: Not on file  Tobacco Use  . Smoking status: Never Smoker  . Smokeless tobacco: Never Used  Substance and Sexual Activity  .  Alcohol use: No  . Drug use: No  . Sexual activity: Not on file  Lifestyle  . Physical activity:    Days per week: Not on file    Minutes per session: Not on file  . Stress: Not on file  Relationships  . Social connections:    Talks on phone: Not on file    Gets together: Not on file    Attends religious service: Not on file    Active member of club or organization: Not on file    Attends meetings of clubs or organizations: Not on file    Relationship status: Not on file  Other Topics Concern  . Not on file  Social History Narrative  . Not on file   No Known Allergies Family History  Problem Relation Age of Onset  . Heart disease Mother   . Diabetes Mother   . Stroke Mother   . Kidney cancer Father      Past medical history, social, surgical and family history all reviewed in electronic medical record.  No pertanent information unless stated regarding to the chief complaint.   Review of Systems:Review of systems updated and as accurate as of 09/11/17  No headache, visual changes, nausea, vomiting, diarrhea, constipation, dizziness, abdominal pain, skin rash, fevers, chills, night sweats, weight loss, swollen lymph nodes,  body aches, joint swelling, muscle aches, chest pain, shortness of breath, mood changes.   Objective  Blood pressure 140/84, pulse (!) 52, height 5\' 10"  (1.778 m), weight 190 lb (86.2 kg), SpO2 97 %. Systems examined below as of 09/11/17   General: No apparent distress alert and oriented x3 mood and affect normal, dressed appropriately.  HEENT: Pupils equal, extraocular movements intact  Respiratory: Patient's speak in full sentences and does not appear short of breath  Cardiovascular: No lower extremity edema, non tender, no erythema  Skin: Warm dry intact with no signs of infection or rash on extremities or on axial skeleton.  Abdomen: Soft nontender exam.  No masses palpated.  Negative McBurney's.  No pain with rebound. Neuro: Cranial nerves II  through XII are intact, neurovascularly intact in all extremities with 2+ DTRs and 2+ pulses.  Lymph: No lymphadenopathy of posterior or anterior cervical chain or axillae bilaterally.  Gait normal with good balance and coordination.  MSK:  Non tender with full range of motion and good stability and symmetric strength and tone of shoulders, elbows, wrist, hip, knee and ankles bilaterally.   Limited musculoskeletal ultrasound was performed and interpreted by Lyndal Pulley  Limited ultrasound of the abdominal region did not show any significant abnormalities.  Patient does have what appears to be be some mild scar tissue formation at the external obliques meeting the rectus sheath on the right upper quadrant.  No masses palpated.  No increasing Doppler flow. Impression: Possible muscle injury of the right upper quadrant    Impression and Recommendations:     This case required medical decision making of moderate complexity.      Note: This dictation was prepared with Dragon dictation along with smaller phrase technology. Any transcriptional errors that result from this process are unintentional.

## 2017-09-10 LAB — GC/CHLAMYDIA PROBE AMP
Chlamydia trachomatis, NAA: NEGATIVE
NEISSERIA GONORRHOEAE BY PCR: NEGATIVE

## 2017-09-11 ENCOUNTER — Ambulatory Visit: Payer: 59 | Admitting: Family Medicine

## 2017-09-11 ENCOUNTER — Encounter: Payer: Self-pay | Admitting: Family Medicine

## 2017-09-11 DIAGNOSIS — R1011 Right upper quadrant pain: Secondary | ICD-10-CM | POA: Diagnosis not present

## 2017-09-11 NOTE — Patient Instructions (Signed)
Good to meet you  I think some muscle cramping occurs due to muscle imbalancers.  Lets Watch closely but we will increase activity  Continue the vitamins Ice 20 minutes 2 times daily. Usually after activity and before bed. pennsaid pinkie amount topically 2 times daily as needed.  OK to do isometric like plank but no twisting and no crunches or sit ups See me again in 4 weeks and if doing great then we will let you push it

## 2017-09-11 NOTE — Assessment & Plan Note (Addendum)
Possible musculoskeletal.  Patient seems to have pain only with certain movements and range of motion.  Does not have any difficulty when doing cardio and only more with lifting. Patient is having no pain today and nothing significantly found on physical exam.  Discussed icing regimen and home exercises.  Discussed which activities to doing which was to avoid.  Patient will increase activity as tolerated well.  Worsening pain consider nitroglycerin.  Differential includes possible diaphragmatic irritation.  Could consider trial of inhaler but patient is able to do cardio without any significant difficulty.  Follow-up again in 4 to 6 weeks

## 2017-09-12 ENCOUNTER — Telehealth: Payer: Self-pay | Admitting: Family Medicine

## 2017-09-12 NOTE — Telephone Encounter (Signed)
Aware of results. 

## 2017-09-15 ENCOUNTER — Other Ambulatory Visit: Payer: Self-pay

## 2017-09-15 ENCOUNTER — Ambulatory Visit (AMBULATORY_SURGERY_CENTER): Payer: 59 | Admitting: Internal Medicine

## 2017-09-15 ENCOUNTER — Encounter: Payer: Self-pay | Admitting: Internal Medicine

## 2017-09-15 ENCOUNTER — Other Ambulatory Visit: Payer: Self-pay | Admitting: Family Medicine

## 2017-09-15 VITALS — BP 116/74 | HR 64 | Temp 98.4°F | Resp 11 | Ht 70.0 in | Wt 190.0 lb

## 2017-09-15 DIAGNOSIS — D122 Benign neoplasm of ascending colon: Secondary | ICD-10-CM

## 2017-09-15 DIAGNOSIS — Z1211 Encounter for screening for malignant neoplasm of colon: Secondary | ICD-10-CM

## 2017-09-15 MED ORDER — SODIUM CHLORIDE 0.9 % IV SOLN: 500.0000 mL | Freq: Once | INTRAVENOUS | Status: DC

## 2017-09-15 NOTE — Patient Instructions (Signed)
Please read handout on polyps, hemorrhoids, and diverticulosis. Consider hemorrhoidal banding if hemorrhoids continue to be a recurring problem.  Please read hemorrhoid banding handout.    YOU HAD AN ENDOSCOPIC PROCEDURE TODAY AT [facility]:   Refer to the procedure report that was given to you for any specific questions about what was found during the examination.  If the procedure report does not answer your questions, please call your gastroenterologist to clarify.  If you requested that your care partner not be given the details of your procedure findings, then the procedure report has been included in a sealed envelope for you to review at your convenience later.  YOU SHOULD EXPECT: Some feelings of bloating in the abdomen. Passage of more gas than usual.  Walking can help get rid of the air that was put into your GI tract during the procedure and reduce the bloating. If you had a lower endoscopy (such as a colonoscopy or flexible sigmoidoscopy) you may notice spotting of blood in your stool or on the toilet paper. If you underwent a bowel prep for your procedure, you may not have a normal bowel movement for a few days.  Please Note:  You might notice some irritation and congestion in your nose or some drainage.  This is from the oxygen used during your procedure.  There is no need for concern and it should clear up in a day or so.  SYMPTOMS TO REPORT IMMEDIATELY:   Following lower endoscopy (colonoscopy or flexible sigmoidoscopy):  Excessive amounts of blood in the stool  Significant tenderness or worsening of abdominal pains  Swelling of the abdomen that is new, acute  Fever of 100F or higher    For urgent or emergent issues, a gastroenterologist can be reached at any hour by calling [phone].   DIET:  We do recommend a small meal at first, but then you may proceed to your regular diet.  Drink plenty of fluids but you should avoid alcoholic beverages for 24  hours.  ACTIVITY:  You should plan to take it easy for the rest of today and you should NOT DRIVE or use heavy machinery until tomorrow (because of the sedation medicines used during the test).    FOLLOW UP: Our staff will call the number listed on your records the next business day following your procedure to check on you and address any questions or concerns that you may have regarding the information given to you following your procedure. If we do not reach you, we will leave a message.  However, if you are feeling well and you are not experiencing any problems, there is no need to return our call.  We will assume that you have returned to your regular daily activities without incident.  If any biopsies were taken you will be contacted by phone or by letter within the next 1-3 weeks.  Please call us at [phone] if you have not heard about the biopsies in 3 weeks.    SIGNATURES/CONFIDENTIALITY: You and/or your care partner have signed paperwork which will be entered into your electronic medical record.  These signatures attest to the fact that that the information above on your After Visit Summary has been reviewed and is understood.  Full responsibility of the confidentiality of this discharge information lies with you and/or your care-partner.

## 2017-09-15 NOTE — Progress Notes (Signed)
Called to room to assist during endoscopic procedure.  Patient ID and intended procedure confirmed with present staff. Received instructions for my participation in the procedure from the performing physician.  

## 2017-09-15 NOTE — Progress Notes (Signed)
To recovery, report to RN, VSS. 

## 2017-09-15 NOTE — Op Note (Signed)
[place] Patient Name: [name] [name] Procedure Date: 09/15/2017 3:48 PM MRN: [id number] Endoscopist: Jerene Bears , MD Age: 52 Referring MD:  Date of Birth: [birth year] Gender: Male Account #: [id number] Procedure:                Colonoscopy Indications:              Screening for colorectal malignant neoplasm Medicines:                Monitored Anesthesia Care Procedure:                Pre-Anesthesia Assessment:                           - Prior to the procedure, a History and Physical                            was performed, and patient medications and                            allergies were reviewed. The patient's tolerance of                            previous anesthesia was also reviewed. The risks                            and benefits of the procedure and the sedation                            options and risks were discussed with the patient.                            All questions were answered, and informed consent                            was obtained. Prior Anticoagulants: The patient has                            taken no previous anticoagulant or antiplatelet                            agents. ASA Grade Assessment: II - A patient with                            mild systemic disease. After reviewing the risks                            and benefits, the patient was deemed in                            satisfactory condition to undergo the procedure.                           After obtaining informed consent, the colonoscope  was passed under direct vision. Throughout the                            procedure, the patient's blood pressure, pulse, and                            oxygen saturations were monitored continuously. The                            Colonoscope was introduced through the anus and                            advanced to the cecum, identified by appendiceal                            orifice and ileocecal  valve. The colonoscopy was                            performed without difficulty. The patient tolerated                            the procedure well. The quality of the bowel                            preparation was good. The ileocecal valve,                            appendiceal orifice, and rectum were photographed. Scope In: 3:53:29 PM Scope Out: 4:08:22 PM Scope Withdrawal Time: 0 hours 11 minutes 45 seconds  Total Procedure Duration: 0 hours 14 minutes 53 seconds  Findings:                 The digital rectal exam was normal.                           A 7 mm polyp was found in the ascending colon. The                            polyp was sessile. The polyp was removed with a                            cold snare. Resection and retrieval were complete.                           A few small and large-mouthed diverticula were                            found in the sigmoid colon and descending colon.                           Internal hemorrhoids were found during                            retroflexion. The hemorrhoids were medium-sized.  Complications:            No immediate complications. Estimated Blood Loss:     Estimated blood loss: none. Impression:               - One 7 mm polyp in the ascending colon, removed                            with a cold snare. Resected and retrieved.                           - Diverticulosis in the sigmoid colon and in the                            descending colon.                           - Internal hemorrhoids. Recommendation:           - Patient has a contact number available for                            emergencies. The signs and symptoms of potential                            delayed complications were discussed with the                            patient. Return to normal activities tomorrow.                            Written discharge instructions were provided to the                            patient.                            - Resume previous diet.                           - Continue present medications.                           - Await pathology results.                           - Repeat colonoscopy is recommended. The                            colonoscopy date will be determined after pathology                            results from today's exam become available for                            review.                           -  Hemorrhoidal banding can be performed in the                            office if internal hemorrhoids remain symptomatic                            or are a recurring problem. Jerene Bears, MD 09/15/2017 4:11:29 PM This report has been signed electronically.

## 2017-09-18 ENCOUNTER — Telehealth: Payer: Self-pay | Admitting: *Deleted

## 2017-09-18 ENCOUNTER — Telehealth: Payer: Self-pay

## 2017-09-18 ENCOUNTER — Encounter: Payer: Self-pay | Admitting: *Deleted

## 2017-09-18 NOTE — Telephone Encounter (Signed)
-----   Message from Larina Bras, [place] sent at 09/15/2017  4:32 PM EDT -----   ----- Message ----- From: Jerene Bears, MD Sent: 09/15/2017   4:20 PM To: Larina Bras, CMA  Banding visits for this pt for bleeding/prolapsing internal hemorrhoids JMP

## 2017-09-18 NOTE — Telephone Encounter (Signed)
Left voicemail for patient to call back. He has been scheduled for hemorrhoidal banding #1 no 09/26/17 at 345 pm. Hemorrhoidal banding #2 is scheduled for 11/21/17 at 345 pm and hemorrhoidal banding #3 will be scheduled at a later date once a schedule becomes available for sometime in September.

## 2017-09-18 NOTE — Telephone Encounter (Signed)
  Follow up Call-  Call back number 09/15/2017  Post procedure Call Back phone  # [phone]  Permission to leave phone message Yes  Some recent data might be hidden     Patient questions:  Message left to call us if necessary.

## 2017-09-18 NOTE — Telephone Encounter (Signed)
Next OV 01/03/18

## 2017-09-18 NOTE — Telephone Encounter (Signed)
  Follow up Call-  Call back number 09/15/2017  Post procedure Call Back phone  # [phone]  Permission to leave phone message Yes  Some recent data might be hidden     No answer Angela/call-back [place]

## 2017-09-19 NOTE — Telephone Encounter (Signed)
I have spoken to patient to give time/date/location of hemorrhoidal banding #1 and #2. Patient verbalizes understanding.

## 2017-09-25 ENCOUNTER — Encounter: Payer: Self-pay | Admitting: Internal Medicine

## 2017-09-26 ENCOUNTER — Encounter: Payer: Self-pay | Admitting: Internal Medicine

## 2017-09-26 ENCOUNTER — Ambulatory Visit: Payer: 59 | Admitting: Internal Medicine

## 2017-09-26 VITALS — BP 140/86 | HR 60 | Ht 70.0 in | Wt 186.0 lb

## 2017-09-26 DIAGNOSIS — K648 Other hemorrhoids: Secondary | ICD-10-CM

## 2017-09-26 NOTE — Patient Instructions (Signed)
You have been scheduled for your 2nd banding on 11/21/17 at 3:45 pm.  HEMORRHOID BANDING PROCEDURE    FOLLOW-UP CARE   1. The procedure you have had should have been relatively painless since the banding of the area involved does not have nerve endings and there is no pain sensation.  The rubber band cuts off the blood supply to the hemorrhoid and the band may fall off as soon as 48 hours after the banding (the band may occasionally be seen in the toilet bowl following a bowel movement). You may notice a temporary feeling of fullness in the rectum which should respond adequately to plain Tylenol or Motrin.  2. Following the banding, avoid strenuous exercise that evening and resume full activity the next day.  A sitz bath (soaking in a warm tub) or bidet is soothing, and can be useful for cleansing the area after bowel movements.     3. To avoid constipation, take two tablespoons of natural wheat bran, natural oat bran, flax, Benefiber or any over the counter fiber supplement and increase your water intake to 7-8 glasses daily.    4. Unless you have been prescribed anorectal medication, do not put anything inside your rectum for two weeks: No suppositories, enemas, fingers, etc.  5. Occasionally, you may have more bleeding than usual after the banding procedure.  This is often from the untreated hemorrhoids rather than the treated one.  Don't be concerned if there is a tablespoon or so of blood.  If there is more blood than this, lie flat with your bottom higher than your head and apply an ice pack to the area. If the bleeding does not stop within a half an hour or if you feel faint, call our office at [phone] or go to the emergency room.  6. Problems are not common; however, if there is a substantial amount of bleeding, severe pain, chills, fever or difficulty passing urine (very rare) or other problems, you should call us at [phone] or report to the nearest emergency  room.  7. Do not stay seated continuously for more than 2-3 hours for a day or two after the procedure.  Tighten your buttock muscles 10-15 times every two hours and take 10-15 deep breaths every 1-2 hours.  Do not spend more than a few minutes on the toilet if you cannot empty your bowel; instead re-visit the toilet at a later time.

## 2017-09-26 NOTE — Progress Notes (Signed)
[name] [name] is a 52 year old male with a history of symptomatic internal hemorrhoids, history of sessile serrated polyp who presents for hemorrhoidal banding. He recently had a screening colonoscopy which I performed on 09/15/2017.  This revealed a 7 mm sessile serrated polyp removed from the ascending colon, diverticulosis in the descending and sigmoid colon and internal hemorrhoids.  He has had on and off symptomatic internal hemorrhoids for years.  We previously discussed this at office visit.  Symptoms for him primarily include intermittent bleeding with bowel movement.  Some perianal discomfort and bulging/prolapse.  No prior hemorrhoidal treatment other than hemorrhoidal creams in the past.   PROCEDURE NOTE:  The patient presents with symptomatic grade 2 internal hemorrhoids, requesting rubber band ligation of his hemorrhoidal disease.  All risks, benefits and alternative forms of therapy were described and informed consent was obtained.   The anorectum was pre-medicated with 0.125% nitroglycerin ointment The decision was made to band the LL internal hemorrhoid, and the [place] was used to perform band ligation without complication.   Digital anorectal examination was then performed to assure proper positioning of the band, and to adjust the banded tissue as required.  The patient was discharged home without pain or other issues.  Dietary and behavioral recommendations were given and along with follow-up instructions.      The patient will return as scheduled for  follow-up and possible additional banding as required. No complications were encountered and the patient tolerated the procedure well.

## 2017-10-10 NOTE — Progress Notes (Signed)
[name] [name], [name] [address] Phone: [phone] Subjective:    I'm seeing this patient by the request  of:    CC: Right abdominal pain follow-up  [id number]  [name] [name] is a 52 y.o. male coming in with complaint of right abdominal pain.  Was seen previously 1 month ago and had more of a right upper quadrant abdominal pain.  Seems to be mostly muscular.  Patient was given exercises.  States 3 weeks into the conservative therapy was nearly pain-free.  Feeling good.  And was having a bowel movement and cough and pain seemed to come back again.  Denies any masses.  Denies any fevers chills any abnormal weight loss.  Continues to have discomfort even to light sensation down.    Past Medical History:  Diagnosis Date  . Chronic pain   . Dental crown present   . Diabetes mellitus without complication ([place])   . Diverticulosis   . Hypertension    no meds  . Internal hemorrhoids    Past Surgical History:  Procedure Laterality Date  . CARPAL TUNNEL RELEASE Right 03/06/2014   Procedure: RIGHT CARPAL TUNNEL RELEASE;  Surgeon: Daryll Brod, MD;  Location: [facility];  Service: Orthopedics;  Laterality: Right;  . COLONOSCOPY    . GANGLION CYST EXCISION  2009   rt wrist  . GANGLION CYST EXCISION Left 01/17/2013   Procedure: EXCISION VOLAR MYXOID CYST LEFT WRIST;  Surgeon: Cammie Sickle., MD;  Location: [facility];  Service: Orthopedics;  Laterality: Left;  . Right Bicep surgery  2008   Radial nerve and cyst removal-bicep rupture  . Right shoulder surgery  2010  . SHOULDER ARTHROSCOPY  98,09   right   Social History   Socioeconomic History  . Marital status: Married    Spouse name: Not on file  . Number of children: Not on file  . Years of education: Not on file  . Highest education level: Not on file  Occupational History  . Not on file  Social Needs  . Financial resource strain: Not on file    . Food insecurity:    Worry: Not on file    Inability: Not on file  . Transportation needs:    Medical: Not on file    Non-medical: Not on file  Tobacco Use  . Smoking status: Never Smoker  . Smokeless tobacco: Never Used  Substance and Sexual Activity  . Alcohol use: No  . Drug use: No  . Sexual activity: Not on file  Lifestyle  . Physical activity:    Days per week: Not on file    Minutes per session: Not on file  . Stress: Not on file  Relationships  . Social connections:    Talks on phone: Not on file    Gets together: Not on file    Attends religious service: Not on file    Active member of club or organization: Not on file    Attends meetings of clubs or organizations: Not on file    Relationship status: Not on file  Other Topics Concern  . Not on file  Social History Narrative  . Not on file   No Known Allergies Family History  Problem Relation Age of Onset  . Heart disease Mother   . Diabetes Mother   . Stroke Mother   . Kidney cancer Father   . Colon cancer Neg Hx   . Rectal  cancer Neg Hx      Past medical history, social, surgical and family history all reviewed in electronic medical record.  No pertanent information unless stated regarding to the chief complaint.   Review of Systems:Review of systems updated and as accurate as of 10/11/17  No headache, visual changes, nausea, vomiting, diarrhea, constipation, dizziness,skin rash, fevers, chills, night sweats, weight loss, swollen lymph nodes, body aches, joint swelling, , chest pain, shortness of breath, mood changes.  Positive abdominal pain and muscle aches  Objective  Blood pressure (!) 160/78, pulse 63, height 5\' 10"  (1.778 m), weight 185 lb (83.9 kg), SpO2 97 %. Systems examined below as of 10/11/17   General: No apparent distress alert and oriented x3 mood and affect normal, dressed appropriately.  HEENT: Pupils equal, extraocular movements intact  Respiratory: Patient's speak in full sentences  and does not appear short of breath  Cardiovascular: No lower extremity edema, non tender, no erythema  Skin: Warm dry intact with no signs of infection or rash on extremities or on axial skeleton.  Abdomen: Soft patient does have some discomfort over the costal margin on the right side.  No masses appreciated no rebound tenderness.  Negative McBurney's Neuro: Cranial nerves II through XII are intact, neurovascularly intact in all extremities with 2+ DTRs and 2+ pulses.  Lymph: No lymphadenopathy of posterior or anterior cervical chain or axillae bilaterally.  Gait normal with good balance and coordination.  MSK:  Non tender with full range of motion and good stability and symmetric strength and tone of shoulders, elbows, wrist, hip, knee and ankles bilaterally.     Impression and Recommendations:     This case required medical decision making of moderate complexity.      Note: This dictation was prepared with Dragon dictation along with smaller phrase technology. Any transcriptional errors that result from this process are unintentional.

## 2017-10-11 ENCOUNTER — Ambulatory Visit: Payer: 59 | Admitting: Family Medicine

## 2017-10-11 ENCOUNTER — Encounter: Payer: Self-pay | Admitting: Family Medicine

## 2017-10-11 DIAGNOSIS — R1011 Right upper quadrant pain: Secondary | ICD-10-CM | POA: Diagnosis not present

## 2017-10-11 MED ORDER — NITROGLYCERIN 0.2 MG/HR TD PT24: MEDICATED_PATCH | TRANSDERMAL | 1 refills | Status: DC

## 2017-10-11 NOTE — Assessment & Plan Note (Signed)
Worsening pain.  Likely intercostal muscle injury.  Seems to be more muscular with them being more painful to movement.  History of internal hemorrhoids work-up by GI otherwise has been fairly unremarkable.  We discussed possible further imaging may be necessary.  Do feel though that is mostly muscular.  We discussed the nitroglycerin and warned of potential side effects.  Started this.  Follow-up again 4 to 8 weeks

## 2017-10-11 NOTE — Patient Instructions (Signed)
Good to see you  Ice 20 minutes 2 times daily. Usually after activity and before bed. pennsaid pinkie amount topically 2 times daily as needed.  Nitroglycerin Protocol   Apply 1/4 nitroglycerin patch to affected area daily.  Change position of patch within the affected area every 24 hours.  You may experience a headache during the first 1-2 weeks of using the patch, these should subside.  If you experience headaches after beginning nitroglycerin patch treatment, you may take your preferred over the counter pain reliever.  Another side effect of the nitroglycerin patch is skin irritation or rash related to patch adhesive.  Please notify our office if you develop more severe headaches or rash, and stop the patch.  Tendon healing with nitroglycerin patch may require 12 to 24 weeks depending on the extent of injury.  Men should not use if taking Viagra, Cialis, or Levitra.   Do not use if you have migraines or rosacea.   See me again in 4 weeks

## 2017-11-06 NOTE — Progress Notes (Signed)
[facility] [address], [place] [address] Phone: [phone] Subjective:    I  CC: Right upper quadrant pain follow-up  [id number]  [name] [name] is a 52 y.o. male coming in with complaint of right upper quadrant pain. Pain has improved. Feels like he is 100%. Did use nitro patches but    Past Medical History:  Diagnosis Date  . Chronic pain   . Dental crown present   . Diabetes mellitus without complication ([place] [facility])   . Diverticulosis   . Hypertension    no meds  . Internal hemorrhoids    Past Surgical History:  Procedure Laterality Date  . CARPAL TUNNEL RELEASE Right 03/06/2014   Procedure: RIGHT CARPAL TUNNEL RELEASE;  Surgeon: Daryll Brod, MD;  Location: [facility];  Service: Orthopedics;  Laterality: Right;  . COLONOSCOPY    . GANGLION CYST EXCISION  2009   rt wrist  . GANGLION CYST EXCISION Left 01/17/2013   Procedure: EXCISION VOLAR MYXOID CYST LEFT WRIST;  Surgeon: Cammie Sickle., MD;  Location: [facility];  Service: Orthopedics;  Laterality: Left;  . Right Bicep surgery  2008   Radial nerve and cyst removal-bicep rupture  . Right shoulder surgery  2010  . SHOULDER ARTHROSCOPY  98,09   right   Social History   Socioeconomic History  . Marital status: Married    Spouse name: Not on file  . Number of children: Not on file  . Years of education: Not on file  . Highest education level: Not on file  Occupational History  . Not on file  Social Needs  . Financial resource strain: Not on file  . Food insecurity:    Worry: Not on file    Inability: Not on file  . Transportation needs:    Medical: Not on file    Non-medical: Not on file  Tobacco Use  . Smoking status: Never Smoker  . Smokeless tobacco: Never Used  Substance and Sexual Activity  . Alcohol use: No  . Drug use: No  . Sexual activity: Not on file  Lifestyle  . Physical activity:    Days per week: Not on file   Minutes per session: Not on file  . Stress: Not on file  Relationships  . Social connections:    Talks on phone: Not on file    Gets together: Not on file    Attends religious service: Not on file    Active member of club or organization: Not on file    Attends meetings of clubs or organizations: Not on file    Relationship status: Not on file  Other Topics Concern  . Not on file  Social History Narrative  . Not on file   No Known Allergies Family History  Problem Relation Age of Onset  . Heart disease Mother   . Diabetes Mother   . Stroke Mother   . Kidney cancer Father   . Colon cancer Neg Hx   . Rectal cancer Neg Hx      Past medical history, social, surgical and family history all reviewed in electronic medical record.  No pertanent information unless stated regarding to the chief complaint.   Review of Systems:Review of systems updated and as accurate as of 11/06/17  No headache, visual changes, nausea, vomiting, diarrhea, constipation, dizziness, abdominal pain, skin rash, fevers, chills, night sweats, weight loss, swollen lymph nodes, body aches, joint swelling, muscle aches, chest pain, shortness  of breath, mood changes.   Objective  There were no vitals taken for this visit. Systems examined below as of 11/06/17   General: No apparent distress alert and oriented x3 mood and affect normal, dressed appropriately.  HEENT: Pupils equal, extraocular movements intact  Respiratory: Patient's speak in full sentences and does not appear short of breath  Cardiovascular: No lower extremity edema, non tender, no erythema  Skin: Warm dry intact with no signs of infection or rash on extremities or on axial skeleton.  Abdomen: Soft nontender  Neuro: Cranial nerves II through XII are intact, neurovascularly intact in all extremities with 2+ DTRs and 2+ pulses.  Lymph: No lymphadenopathy of posterior or anterior cervical chain or axillae bilaterally.  Gait normal with good balance  and coordination.  MSK:  Non tender with full range of motion and good stability and symmetric strength and tone of shoulders, elbows, wrist, hip, knee and ankles bilaterally.     Impression and Recommendations:     This case required medical decision making of moderate complexity.      Note: This dictation was prepared with Dragon dictation along with smaller phrase technology. Any transcriptional errors that result from this process are unintentional.

## 2017-11-07 ENCOUNTER — Ambulatory Visit: Payer: 59 | Admitting: Family Medicine

## 2017-11-07 ENCOUNTER — Encounter: Payer: Self-pay | Admitting: Family Medicine

## 2017-11-07 DIAGNOSIS — R1011 Right upper quadrant pain: Secondary | ICD-10-CM | POA: Diagnosis not present

## 2017-11-07 NOTE — Assessment & Plan Note (Signed)
Likely muscular in some form or possibly intercostal cartilage damage.  Patient is having no pain.  Nothing on physical exam.  Discontinue nitroglycerin.  Follow-up as needed

## 2017-11-21 ENCOUNTER — Encounter: Payer: Self-pay | Admitting: Internal Medicine

## 2017-11-21 ENCOUNTER — Ambulatory Visit: Payer: 59 | Admitting: Internal Medicine

## 2017-11-21 VITALS — BP 136/86 | HR 81 | Ht 70.0 in | Wt 187.2 lb

## 2017-11-21 DIAGNOSIS — K648 Other hemorrhoids: Secondary | ICD-10-CM

## 2017-11-21 DIAGNOSIS — K641 Second degree hemorrhoids: Secondary | ICD-10-CM

## 2017-11-21 NOTE — Patient Instructions (Addendum)
Please follow up as needed.   HEMORRHOID BANDING PROCEDURE    FOLLOW-UP CARE   1. The procedure you have had should have been relatively painless since the banding of the area involved does not have nerve endings and there is no pain sensation.  The rubber band cuts off the blood supply to the hemorrhoid and the band may fall off as soon as 48 hours after the banding (the band may occasionally be seen in the toilet bowl following a bowel movement). You may notice a temporary feeling of fullness in the rectum which should respond adequately to plain Tylenol or Motrin.  2. Following the banding, avoid strenuous exercise that evening and resume full activity the next day.  A sitz bath (soaking in a warm tub) or bidet is soothing, and can be useful for cleansing the area after bowel movements.     3. To avoid constipation, take two tablespoons of natural wheat bran, natural oat bran, flax, Benefiber or any over the counter fiber supplement and increase your water intake to 7-8 glasses daily.    4. Unless you have been prescribed anorectal medication, do not put anything inside your rectum for two weeks: No suppositories, enemas, fingers, etc.  5. Occasionally, you may have more bleeding than usual after the banding procedure.  This is often from the untreated hemorrhoids rather than the treated one.  Don't be concerned if there is a tablespoon or so of blood.  If there is more blood than this, lie flat with your bottom higher than your head and apply an ice pack to the area. If the bleeding does not stop within a half an hour or if you feel faint, call our office at [phone] or go to the emergency room.  6. Problems are not common; however, if there is a substantial amount of bleeding, severe pain, chills, fever or difficulty passing urine (very rare) or other problems, you should call us at [phone] or report to the nearest emergency room.  7. Do not stay seated continuously for more  than 2-3 hours for a day or two after the procedure.  Tighten your buttock muscles 10-15 times every two hours and take 10-15 deep breaths every 1-2 hours.  Do not spend more than a few minutes on the toilet if you cannot empty your bowel; instead re-visit the toilet at a later time.   If you are age 3 or older, your body mass index should be between 23-30. Your Body mass index is 26.86 kg/m. If this is out of the aforementioned range listed, please consider follow up with your Primary Care Provider.  If you are age 78 or younger, your body mass index should be between 19-25. Your Body mass index is 26.86 kg/m. If this is out of the aformentioned range listed, please consider follow up with your Primary Care Provider.

## 2017-11-21 NOTE — Progress Notes (Signed)
[name] [name] is a 52 year old male with a history of symptomatic internal hemorrhoids resents for repeat hemorrhoidal banding Initial hemorrhoidal banding performed on 09/26/2017 Symptoms prior to banding included intermittent rectal bleeding with bowel movement, some perianal bulging and discomfort/prolapse Tolerated the first banding well.  Perianal swelling and prolapse has improved.  One episode of isolated rectal bleeding with bowel movement occurring several weeks ago after taking a long hike.   PROCEDURE NOTE:  The patient presents with symptomatic grade 2 internal hemorrhoids, requesting rubber band ligation of his hemorrhoidal disease.  All risks, benefits and alternative forms of therapy were described and informed consent was obtained.   The anorectum was pre-medicated with 0.125% nitroglycerin ointment The decision was made to band the RA (LL on 09/26/17) internal hemorrhoid, and the [place] was used to perform band ligation without complication.   Digital anorectal examination was then performed to assure proper positioning of the band, and to adjust the banded tissue as required.  The patient was discharged home without pain or other issues.  Dietary and behavioral recommendations were given and along with follow-up instructions.     The patient will return if needed for follow-up and additional banding if required for recurrent hemorrhoidal symptoms.   No complications were encountered and the patient tolerated the procedure well.

## 2017-11-30 DIAGNOSIS — M5412 Radiculopathy, cervical region: Secondary | ICD-10-CM | POA: Diagnosis not present

## 2017-12-05 ENCOUNTER — Other Ambulatory Visit: Payer: Self-pay | Admitting: Family Medicine

## 2017-12-06 NOTE — Telephone Encounter (Signed)
OV 01/03/18

## 2017-12-15 ENCOUNTER — Other Ambulatory Visit: Payer: Self-pay | Admitting: Family Medicine

## 2017-12-15 NOTE — Telephone Encounter (Signed)
Last seen 09/08/17  DWM

## 2017-12-20 DIAGNOSIS — B351 Tinea unguium: Secondary | ICD-10-CM | POA: Diagnosis not present

## 2017-12-20 DIAGNOSIS — D225 Melanocytic nevi of trunk: Secondary | ICD-10-CM | POA: Diagnosis not present

## 2017-12-20 DIAGNOSIS — L821 Other seborrheic keratosis: Secondary | ICD-10-CM | POA: Diagnosis not present

## 2018-01-03 ENCOUNTER — Encounter: Payer: Self-pay | Admitting: Family Medicine

## 2018-01-03 ENCOUNTER — Ambulatory Visit: Payer: 59 | Admitting: Family Medicine

## 2018-01-03 VITALS — BP 121/72 | HR 61 | Temp 97.5°F | Ht 70.0 in | Wt 185.0 lb

## 2018-01-03 DIAGNOSIS — H6121 Impacted cerumen, right ear: Secondary | ICD-10-CM

## 2018-01-03 DIAGNOSIS — I10 Essential (primary) hypertension: Secondary | ICD-10-CM

## 2018-01-03 DIAGNOSIS — E7800 Pure hypercholesterolemia, unspecified: Secondary | ICD-10-CM | POA: Diagnosis not present

## 2018-01-03 DIAGNOSIS — Z8249 Family history of ischemic heart disease and other diseases of the circulatory system: Secondary | ICD-10-CM

## 2018-01-03 DIAGNOSIS — Z23 Encounter for immunization: Secondary | ICD-10-CM | POA: Diagnosis not present

## 2018-01-03 DIAGNOSIS — R1011 Right upper quadrant pain: Secondary | ICD-10-CM

## 2018-01-03 DIAGNOSIS — M5030 Other cervical disc degeneration, unspecified cervical region: Secondary | ICD-10-CM

## 2018-01-03 DIAGNOSIS — Z0000 Encounter for general adult medical examination without abnormal findings: Secondary | ICD-10-CM

## 2018-01-03 DIAGNOSIS — E119 Type 2 diabetes mellitus without complications: Secondary | ICD-10-CM | POA: Diagnosis not present

## 2018-01-03 DIAGNOSIS — M4802 Spinal stenosis, cervical region: Secondary | ICD-10-CM

## 2018-01-03 LAB — BAYER DCA HB A1C WAIVED: HB A1C: 5.8 % (ref <7.0)

## 2018-01-03 MED ORDER — LISINOPRIL-HYDROCHLOROTHIAZIDE 20-25 MG PO TABS: 1.0000 | ORAL_TABLET | Freq: Every day | ORAL | 3 refills | Status: DC

## 2018-01-03 NOTE — Progress Notes (Signed)
Subjective:    Patient ID: [name] [name], male    DOB: Nov 14, 1965, 52 y.o.   MRN: [id number]  HPI Pt here for follow up and management of chronic medical problems which includes diabetes, hypertension and hyperlipidemia. He is taking medication regularly.  Patient is pleasant and relaxed.  All of his recent visits to specialists were reviewed with him during the visit today.  He did have a stress test that was considered negative.  He has been seeing the neurosurgeon and has plans to get an injection in his neck because of degenerative changes and neck pain.  He has had a colonoscopy and plans to have a repeat one again in 5 years by Dr. Hilarie Fredrickson.  He has had hemorrhoid banding which is doing well.  The patient today denies any chest pain or shortness of breath.  He denies today any trouble with his stomach including nausea vomiting diarrhea blood in the stool or black tarry bowel movements.  He is passing his water well.  He plans to get an eye exam in November.  He will get his flu shot today.    Patient Active Problem List   Diagnosis Date Noted  . Right upper quadrant abdominal pain 09/11/2017  . Type 2 diabetes mellitus ([place]) 07/01/2013  . Dyslipidemia associated with type 2 diabetes mellitus ([place]) 07/01/2013  . Vitamin D deficiency 06/03/2013  . Hypertension   . RECTAL BLEEDING 01/22/2008   Outpatient Encounter Medications as of 01/03/2018  Medication Sig  . acetaminophen (TYLENOL) 500 MG tablet Take 500 mg by mouth every 6 (six) hours as needed.  . cholecalciferol (VITAMIN D) 1000 UNITS tablet Take 1,000 Units by mouth daily. 5000  . fluticasone (FLONASE) 50 MCG/ACT nasal spray Place into both nostrils daily.  Marland Kitchen glucose blood test strip Use to check BG daily  . lisinopril-hydrochlorothiazide (PRINZIDE,ZESTORETIC) 20-25 MG tablet TAKE 1 TABLET BY MOUTH DAILY.  . metFORMIN (GLUCOPHAGE) 500 MG tablet TAKE 1 TABLET (500 MG TOTAL) BY MOUTH 2 (TWO) TIMES DAILY WITH A MEAL.  . Multiple  Vitamins-Minerals (MULTIVITAMIN WITH MINERALS) tablet Take 1 tablet by mouth daily.  [name] DELICA LANCETS 13Y MISC Use to check BG daily  . triamcinolone cream (KENALOG) 0.1 % Apply 1 application topically 2 (two) times daily.   Facility-Administered Encounter Medications as of 01/03/2018  Medication  . 0.9 %  sodium chloride infusion      Review of Systems  Constitutional: Negative.   HENT: Negative.   Eyes: Negative.   Respiratory: Negative.   Cardiovascular: Negative.   Gastrointestinal: Negative.   Endocrine: Negative.   Genitourinary: Negative.   Musculoskeletal: Negative.   Skin: Negative.   Allergic/Immunologic: Negative.   Neurological: Negative.   Hematological: Negative.   Psychiatric/Behavioral: Negative.        Objective:   Physical Exam  Constitutional: He is oriented to person, place, and time. He appears well-developed and well-nourished. No distress.  The patient is pleasant and alert and in good spirits today.  HENT:  Head: Normocephalic and atraumatic.  Left Ear: External ear normal.  Nose: Nose normal.  Mouth/Throat: Oropharynx is clear and moist. No oropharyngeal exudate.  Ear cerumen right ear canal  Eyes: Pupils are equal, round, and reactive to light. Conjunctivae and EOM are normal. Right eye exhibits no discharge. Left eye exhibits no discharge. No scleral icterus.  Patient has eye exam scheduled in November  Neck: Normal range of motion. Neck supple. No thyromegaly present.  No bruits thyromegaly or anterior cervical  adenopathy  Cardiovascular: Normal rate, regular rhythm, normal heart sounds and intact distal pulses.  No murmur heard. The heart is regular at 60/min.  Pedal pulses were present bilaterally but slightly diminished in the left foot.  Pulmonary/Chest: Effort normal and breath sounds normal. No respiratory distress. He has no wheezes. He has no rales. He exhibits no tenderness.  Clear anteriorly and posteriorly with no axillary  adenopathy.  No chest wall masses  Abdominal: Soft. Bowel sounds are normal. He exhibits no mass. There is no tenderness. There is no rebound and no guarding.  No abdominal tenderness masses organ enlargement bruits or suprapubic tenderness.  No inguinal adenopathy  Musculoskeletal: Normal range of motion. He exhibits no edema.  Lymphadenopathy:    He has no cervical adenopathy.  Neurological: He is alert and oriented to person, place, and time. He has normal reflexes.  Skin: Skin is warm and dry.  Psychiatric: He has a normal mood and affect. His behavior is normal. Judgment and thought content normal.  Patient's mood affect and behavior were all normal for him.  Nursing note and vitals reviewed.  BP (!) 148/90 (BP Location: Left Arm)   Pulse 62   Temp (!) 97.5 F (36.4 C) (Oral)   Ht 5' 10"  (1.778 m)   Wt 185 lb (83.9 kg)   BMI 26.54 kg/m         Assessment & Plan:  1. Type 2 diabetes mellitus without complication, without long-term current use of insulin (HCC) -Continue to follow diet closely and check blood sugars regularly. -Continue current treatment. - CBC with Differential/Platelet - Bayer DCA Hb A1c Waived  2. Pure hypercholesterolemia -Continue current treatment pending results of lab work - CBC with Differential/Platelet - Lipid panel  3. Essential hypertension -Blood pressure is good today and he will continue with current treatment - CBC with Differential/Platelet - BMP8+EGFR - Hepatic function panel  4. Family history of chronic ischemic heart disease -Patient did have stress test recently but personally he did not feel that it was done appropriately although the report read this as normal.  He said that some of the leads kept coming off and he did not feel like it was a good test from his standpoint. - CBC with Differential/Platelet  5. Health care maintenance - VITAMIN D 25 Hydroxy (Vit-D Deficiency, Fractures)  6. DDD (degenerative disc disease),  cervical -Follow-up with neurosurgery for injection as planned  7. Foraminal stenosis of cervical region -Neurosurgery as planned  8. RUQ pain -This has resolved  9. Impacted cerumen, right ear -Irrigation to remove cerumen today.  Meds ordered this encounter  Medications  . lisinopril-hydrochlorothiazide (PRINZIDE,ZESTORETIC) 20-25 MG tablet    Sig: Take 1 tablet by mouth daily.    Dispense:  90 tablet    Refill:  3   Patient Instructions  Continue current medications. Continue good therapeutic lifestyle changes which include good diet and exercise. Fall precautions discussed with patient. If an FOBT was given today- please return it to our front desk. If you are over 12 years old - you may need Prevnar 56 or the adult Pneumonia vaccine.  **Flu shots are available--- please call and schedule a FLU-CLINIC appointment**  After your visit with [place] today you will receive a survey in the mail or online from [employer] regarding your care with [place]. Please take a moment to fill this out. Your feedback is very important to [place] as you can help [place] better understand your patient needs as well as improve  your experience and satisfaction. WE CARE ABOUT YOU!!!   The patient can purchase Debrox over-the-counter and use 2 to 3 drops nightly for 3 nights and repeat this in 1 week if he continues to have problems with ear cerumen buildup He should continue to practice healthy living habits and keep his weight down and avoid falling as much as possible as all of this will do more damage to the spine and the neck and the back. He should check his blood sugars regularly and follow his diet closely He should make sure that he gets his eye exam yearly He should follow-up with neurosurgery as planned He should get his next colonoscopy as planned in 5 years with gastroenterologist.  Arrie Senate MD

## 2018-01-03 NOTE — Patient Instructions (Addendum)
Continue current medications. Continue good therapeutic lifestyle changes which include good diet and exercise. Fall precautions discussed with patient. If an FOBT was given today- please return it to our front desk. If you are over 52 years old - you may need Prevnar 24 or the adult Pneumonia vaccine.  **Flu shots are available--- please call and schedule a FLU-CLINIC appointment**  After your visit with [place] today you will receive a survey in the mail or online from [employer] regarding your care with [place]. Please take a moment to fill this out. Your feedback is very important to [place] as you can help [place] better understand your patient needs as well as improve your experience and satisfaction. WE CARE ABOUT YOU!!!   The patient can purchase Debrox over-the-counter and use 2 to 3 drops nightly for 3 nights and repeat this in 1 week if he continues to have problems with ear cerumen buildup He should continue to practice healthy living habits and keep his weight down and avoid falling as much as possible as all of this will do more damage to the spine and the neck and the back. He should check his blood sugars regularly and follow his diet closely He should make sure that he gets his eye exam yearly He should follow-up with neurosurgery as planned He should get his next colonoscopy as planned in 5 years with gastroenterologist.

## 2018-01-04 LAB — BMP8+EGFR
BUN / CREAT RATIO: 25 — AB (ref 9–20)
BUN: 21 mg/dL (ref 6–24)
CO2: 26 mmol/L (ref 20–29)
CREATININE: 0.83 mg/dL (ref 0.76–1.27)
Calcium: 9.9 mg/dL (ref 8.7–10.2)
Chloride: 99 mmol/L (ref 96–106)
GFR calc Af Amer: 117 mL/min/{1.73_m2} (ref >59)
GFR calc non Af Amer: 101 mL/min/{1.73_m2} (ref >59)
GLUCOSE: 105 mg/dL — AB (ref 65–99)
Potassium: 4.4 mmol/L (ref 3.5–5.2)
Sodium: 137 mmol/L (ref 134–144)

## 2018-01-04 LAB — LIPID PANEL
CHOLESTEROL TOTAL: 170 mg/dL (ref 100–199)
Chol/HDL Ratio: 3.2 ratio (ref 0.0–5.0)
HDL: 53 mg/dL (ref >39)
LDL CALC: 104 mg/dL — AB (ref 0–99)
Triglycerides: 66 mg/dL (ref 0–149)
VLDL CHOLESTEROL CAL: 13 mg/dL (ref 5–40)

## 2018-01-04 LAB — CBC WITH DIFFERENTIAL/PLATELET
BASOS ABS: 0.1 10*3/uL (ref 0.0–0.2)
Basos: 1 %
EOS (ABSOLUTE): 0.4 10*3/uL (ref 0.0–0.4)
EOS: 7 %
HEMOGLOBIN: 13.9 g/dL (ref 13.0–17.7)
Hematocrit: 41.8 % (ref 37.5–51.0)
IMMATURE GRANS (ABS): 0 10*3/uL (ref 0.0–0.1)
Immature Granulocytes: 0 %
LYMPHS: 39 %
Lymphocytes Absolute: 2.2 10*3/uL (ref 0.7–3.1)
MCH: 29.4 pg (ref 26.6–33.0)
MCHC: 33.3 g/dL (ref 31.5–35.7)
MCV: 88 fL (ref 79–97)
Monocytes Absolute: 0.7 10*3/uL (ref 0.1–0.9)
Monocytes: 12 %
NEUTROS ABS: 2.3 10*3/uL (ref 1.4–7.0)
Neutrophils: 41 %
Platelets: 376 10*3/uL (ref 150–450)
RBC: 4.73 x10E6/uL (ref 4.14–5.80)
RDW: 12.8 % (ref 12.3–15.4)
WBC: 5.6 10*3/uL (ref 3.4–10.8)

## 2018-01-04 LAB — HEPATIC FUNCTION PANEL
ALBUMIN: 4.7 g/dL (ref 3.5–5.5)
ALT: 18 IU/L (ref 0–44)
AST: 18 IU/L (ref 0–40)
Alkaline Phosphatase: 45 IU/L (ref 39–117)
BILIRUBIN TOTAL: 0.5 mg/dL (ref 0.0–1.2)
Bilirubin, Direct: 0.16 mg/dL (ref 0.00–0.40)
Total Protein: 6.9 g/dL (ref 6.0–8.5)

## 2018-01-04 LAB — VITAMIN D 25 HYDROXY (VIT D DEFICIENCY, FRACTURES): Vit D, 25-Hydroxy: 71.7 ng/mL (ref 30.0–100.0)

## 2018-01-05 ENCOUNTER — Other Ambulatory Visit: Payer: Self-pay | Admitting: *Deleted

## 2018-01-05 DIAGNOSIS — E7800 Pure hypercholesterolemia, unspecified: Secondary | ICD-10-CM

## 2018-01-05 MED ORDER — ROSUVASTATIN CALCIUM 5 MG PO TABS: ORAL_TABLET | ORAL | 3 refills | Status: DC

## 2018-01-08 DIAGNOSIS — M5412 Radiculopathy, cervical region: Secondary | ICD-10-CM | POA: Diagnosis not present

## 2018-02-07 DIAGNOSIS — I10 Essential (primary) hypertension: Secondary | ICD-10-CM | POA: Diagnosis not present

## 2018-02-07 DIAGNOSIS — Z6827 Body mass index (BMI) 27.0-27.9, adult: Secondary | ICD-10-CM | POA: Diagnosis not present

## 2018-02-16 DIAGNOSIS — H1789 Other corneal scars and opacities: Secondary | ICD-10-CM | POA: Diagnosis not present

## 2018-02-16 DIAGNOSIS — H5211 Myopia, right eye: Secondary | ICD-10-CM | POA: Diagnosis not present

## 2018-02-16 DIAGNOSIS — H524 Presbyopia: Secondary | ICD-10-CM | POA: Diagnosis not present

## 2018-03-13 ENCOUNTER — Other Ambulatory Visit: Payer: Self-pay | Admitting: Nurse Practitioner

## 2018-04-17 ENCOUNTER — Other Ambulatory Visit: Payer: Self-pay | Admitting: Family Medicine

## 2018-06-11 ENCOUNTER — Other Ambulatory Visit: Payer: Self-pay | Admitting: Family Medicine

## 2018-07-03 ENCOUNTER — Other Ambulatory Visit: Payer: Self-pay

## 2018-07-04 ENCOUNTER — Ambulatory Visit: Payer: 59 | Admitting: Family Medicine

## 2018-07-20 ENCOUNTER — Other Ambulatory Visit: Payer: Self-pay

## 2018-07-20 ENCOUNTER — Ambulatory Visit (INDEPENDENT_AMBULATORY_CARE_PROVIDER_SITE_OTHER): Payer: 59 | Admitting: Family Medicine

## 2018-07-20 DIAGNOSIS — R21 Rash and other nonspecific skin eruption: Secondary | ICD-10-CM | POA: Diagnosis not present

## 2018-07-20 DIAGNOSIS — W57XXXA Bitten or stung by nonvenomous insect and other nonvenomous arthropods, initial encounter: Secondary | ICD-10-CM

## 2018-07-20 DIAGNOSIS — S40269A Insect bite (nonvenomous) of unspecified shoulder, initial encounter: Secondary | ICD-10-CM

## 2018-07-20 MED ORDER — DOXYCYCLINE HYCLATE 100 MG PO TABS: 100.0000 mg | ORAL_TABLET | Freq: Two times a day (BID) | ORAL | 0 refills | Status: DC

## 2018-07-20 NOTE — Patient Instructions (Signed)

## 2018-07-20 NOTE — Progress Notes (Signed)
Telephone visit  Subjective: CC:Tick bite PCP: Chipper Herb, MD HQI:[name] [name] is a 54 y.o. male calls for telephone consult today. Patient provides verbal consent for consult held via phone.  Location of patient: office Location of provider: [facility] Others present for call: none  1. Tick bite Patient reports that last week he had multiple ticks crawling over him.  He noticed a tick that was attached to him on his shoulder posteriorly that he thought he had removed entirely but today noted that he had an itching and lump sensation appreciated on that shoulder.  He looked in the mirror noticed a very large red spot that was about 3 inches across.  He had the nurse at work take a look at it and there was a questionable target lesion there.  He has had some muscle aches but these always seem to be after intense workouts.  He denies any joint pain, fevers, chills, headache, nausea.  He notes that the tick was extremely small and he cannot tell if it was engorged.  He does not think that it was on him for longer than 24 hours initially but again this morning when he took a look at the lesion on his shoulder he noticed that there was a piece of the tick still embedded in him.  ROS: Per HPI  No Known Allergies Past Medical History:  Diagnosis Date  . Chronic pain   . Dental crown present   . Diabetes mellitus without complication ([place])   . Diverticulosis   . Hypertension    no meds  . Internal hemorrhoids     Current Outpatient Medications:  .  acetaminophen (TYLENOL) 500 MG tablet, Take 500 mg by mouth every 6 (six) hours as needed., Disp: , Rfl:  .  cholecalciferol (VITAMIN D) 1000 UNITS tablet, Take 1,000 Units by mouth daily. 5000, Disp: , Rfl:  .  fluticasone (FLONASE) 50 MCG/ACT nasal spray, Place into both nostrils daily., Disp: , Rfl:  .  glucose blood test strip, Use to check BG daily, Disp: 100 each, Rfl: 3 .  lisinopril-hydrochlorothiazide (PRINZIDE,ZESTORETIC) 20-25 MG tablet,  Take 1 tablet by mouth daily., Disp: 90 tablet, Rfl: 3 .  metFORMIN (GLUCOPHAGE) 500 MG tablet, TAKE 1 TABLET (500 MG TOTAL) BY MOUTH 2 (TWO) TIMES DAILY WITH A MEAL., Disp: 180 tablet, Rfl: 0 .  Multiple Vitamins-Minerals (MULTIVITAMIN WITH MINERALS) tablet, Take 1 tablet by mouth daily., Disp: , Rfl:  .  ONETOUCH DELICA LANCETS 52W MISC, Use to check BG daily, Disp: 100 each, Rfl: 3 .  rosuvastatin (CRESTOR) 5 MG tablet, Take 1 tab po on Mon, Wed and Fri evenings, Disp: 36 tablet, Rfl: 3 .  triamcinolone cream (KENALOG) 0.1 %, Apply 1 application topically 2 (two) times daily., Disp: 80 g, Rfl: 2  Assessment/ Plan: 53 y.o. male   1. Tick bite of shoulder, unspecified laterality, initial encounter Given questionable target lesion, I will empirically treat with doxycycline p.o. twice daily.  Home care instructions reviewed with the patient reasons for reevaluation discussed.  He will follow-up PRN - doxycycline (VIBRA-TABS) 100 MG tablet; Take 1 tablet (100 mg total) by mouth 2 (two) times daily.  Dispense: 20 tablet; Refill: 0  2. Target rash As above - doxycycline (VIBRA-TABS) 100 MG tablet; Take 1 tablet (100 mg total) by mouth 2 (two) times daily.  Dispense: 20 tablet; Refill: 0   Start time: 10:45am End time: 10:52am  Total time spent on patient care (including telephone call/ virtual visit):  15 minutes  Janora Norlander, [place] [phone]

## 2018-08-06 ENCOUNTER — Ambulatory Visit: Payer: 59 | Admitting: Family Medicine

## 2018-09-05 ENCOUNTER — Other Ambulatory Visit: Payer: Self-pay | Admitting: Family Medicine

## 2018-09-06 ENCOUNTER — Other Ambulatory Visit: Payer: Self-pay | Admitting: Family Medicine

## 2018-09-14 ENCOUNTER — Ambulatory Visit: Payer: 59 | Admitting: Family Medicine

## 2018-09-17 ENCOUNTER — Other Ambulatory Visit: Payer: Self-pay

## 2018-09-18 ENCOUNTER — Ambulatory Visit (INDEPENDENT_AMBULATORY_CARE_PROVIDER_SITE_OTHER): Payer: 59 | Admitting: Family Medicine

## 2018-09-18 ENCOUNTER — Ambulatory Visit (INDEPENDENT_AMBULATORY_CARE_PROVIDER_SITE_OTHER): Payer: 59

## 2018-09-18 ENCOUNTER — Encounter: Payer: Self-pay | Admitting: Family Medicine

## 2018-09-18 VITALS — BP 134/74 | HR 65 | Temp 97.0°F | Ht 70.0 in | Wt 183.0 lb

## 2018-09-18 DIAGNOSIS — K439 Ventral hernia without obstruction or gangrene: Secondary | ICD-10-CM

## 2018-09-18 DIAGNOSIS — Z0001 Encounter for general adult medical examination with abnormal findings: Secondary | ICD-10-CM

## 2018-09-18 DIAGNOSIS — R0781 Pleurodynia: Secondary | ICD-10-CM

## 2018-09-18 DIAGNOSIS — E119 Type 2 diabetes mellitus without complications: Secondary | ICD-10-CM | POA: Diagnosis not present

## 2018-09-18 DIAGNOSIS — Z0000 Encounter for general adult medical examination without abnormal findings: Secondary | ICD-10-CM

## 2018-09-18 DIAGNOSIS — E7800 Pure hypercholesterolemia, unspecified: Secondary | ICD-10-CM | POA: Diagnosis not present

## 2018-09-18 DIAGNOSIS — I10 Essential (primary) hypertension: Secondary | ICD-10-CM | POA: Diagnosis not present

## 2018-09-18 LAB — URINALYSIS
Bilirubin, UA: NEGATIVE
Glucose, UA: NEGATIVE
Leukocytes,UA: NEGATIVE
Nitrite, UA: NEGATIVE
Protein,UA: NEGATIVE
RBC, UA: NEGATIVE
Specific Gravity, UA: 1.02 (ref 1.005–1.030)
Urobilinogen, Ur: 0.2 mg/dL (ref 0.2–1.0)
pH, UA: 7 (ref 5.0–7.5)

## 2018-09-18 LAB — LIPID PANEL

## 2018-09-18 LAB — BAYER DCA HB A1C WAIVED: HB A1C (BAYER DCA - WAIVED): 6 % (ref <7.0)

## 2018-09-18 MED ORDER — LISINOPRIL-HYDROCHLOROTHIAZIDE 20-25 MG PO TABS: 1.0000 | ORAL_TABLET | Freq: Every day | ORAL | 1 refills | Status: DC

## 2018-09-18 MED ORDER — FLUTICASONE PROPIONATE 50 MCG/ACT NA SUSP: 1.0000 | Freq: Every day | NASAL | 2 refills | Status: DC

## 2018-09-18 MED ORDER — METFORMIN HCL 500 MG PO TABS: 500.0000 mg | ORAL_TABLET | Freq: Two times a day (BID) | ORAL | 1 refills | Status: DC

## 2018-09-18 MED ORDER — ROSUVASTATIN CALCIUM 5 MG PO TABS: ORAL_TABLET | ORAL | 1 refills | Status: DC

## 2018-09-18 NOTE — Progress Notes (Signed)
Subjective:  Patient ID: [name] [name], male    DOB: 04-28-1965  Age: 53 y.o. MRN: [id number]  CC: Annual Exam (Switching from Dr Laurance Flatten)   HPI [name] presents forcomplete P.E. as well as Follow-up of diabetes. Patient checks blood sugar at home.   <100 fasting and 100-120 postprandial Patient denies symptoms such as polyuria, polydipsia, excessive hunger, nausea No significant hypoglycemic spells noted. Medications reviewed. Pt reports taking them regularly without complication/adverse reaction being reported today.  Checking feet daily. Last eye appt was due  History Derk has a past medical history of Chronic pain, Dental crown present, Diabetes mellitus without complication ([place]), Diverticulosis, Hypertension, and Internal hemorrhoids.   He has a past surgical history that includes Right shoulder surgery (2010); Right Bicep surgery (2008); Ganglion cyst excision (2009); Shoulder arthroscopy (98,09); Colonoscopy; Ganglion cyst excision (Left, 01/17/2013); and Carpal tunnel release (Right, 03/06/2014).   His family history includes Diabetes in his mother; Heart disease in his mother; Kidney cancer in his father; Stroke in his mother.He reports that he has never smoked. He has never used smokeless tobacco. He reports that he does not drink alcohol or use drugs.  Current Outpatient Medications on File Prior to Visit  Medication Sig Dispense Refill  . acetaminophen (TYLENOL) 500 MG tablet Take 500 mg by mouth every 6 (six) hours as needed.    . cholecalciferol (VITAMIN D) 1000 UNITS tablet Take 1,000 Units by mouth daily. 5000    . fluticasone (FLONASE) 50 MCG/ACT nasal spray Place into both nostrils daily.    Marland Kitchen glucose blood test strip Use to check BG daily 100 each 3  . lisinopril-hydrochlorothiazide (PRINZIDE,ZESTORETIC) 20-25 MG tablet Take 1 tablet by mouth daily. 90 tablet 3  . metFORMIN (GLUCOPHAGE) 500 MG tablet TAKE 1 TABLET (500 MG TOTAL) BY MOUTH 2 (TWO) TIMES DAILY WITH A  MEAL. 180 tablet 0  . Multiple Vitamins-Minerals (MULTIVITAMIN WITH MINERALS) tablet Take 1 tablet by mouth daily.    [name] DELICA LANCETS 90W MISC Use to check BG daily 100 each 3  . rosuvastatin (CRESTOR) 5 MG tablet TAKE 1 TAB BY MOUTH ON MON, WED AND FRI EVENINGS 36 tablet 0  . triamcinolone cream (KENALOG) 0.1 % Apply 1 application topically 2 (two) times daily. 80 g 2   No current facility-administered medications on file prior to visit.     ROS Review of Systems  Constitutional: Negative for activity change, fatigue and unexpected weight change.  HENT: Negative for congestion, ear pain, hearing loss, postnasal drip and trouble swallowing.   Eyes: Negative for pain and visual disturbance.  Respiratory: Negative for cough, chest tightness and shortness of breath.   Cardiovascular: Negative for chest pain, palpitations and leg swelling.  Gastrointestinal: Positive for abdominal distention (midline, with exertion. Worse last year. Negative w/u. Dx as ventral hernia. Flares occ with exertion). Negative for abdominal pain, blood in stool, constipation, diarrhea, nausea and vomiting.  Endocrine: Negative for cold intolerance, heat intolerance and polydipsia.  Genitourinary: Negative for difficulty urinating, dysuria, flank pain, frequency and urgency.  Musculoskeletal: Positive for arthralgias. Negative for joint swelling.       Degenerative disc dx in neck.   Felt pop at flank, post ax. Line 2.5 mos ago. Pain trerrible then. Better now. Still some ache   Skin: Negative for color change, rash and wound.  Neurological: Negative for dizziness, syncope, speech difficulty, weakness, light-headedness, numbness and headaches.  Hematological: Does not bruise/bleed easily.  Psychiatric/Behavioral: Negative for confusion, decreased concentration,  dysphoric mood and sleep disturbance. The patient is not nervous/anxious.     Objective:  BP 134/74   Pulse 65   Temp (!) 97 F (36.1 C)  (Oral)   Ht 5' 10"  (1.778 m)   Wt 183 lb (83 kg)   BMI 26.26 kg/m   BP Readings from Last 3 Encounters:  09/18/18 134/74  01/03/18 121/72  11/21/17 136/86    Wt Readings from Last 3 Encounters:  09/18/18 183 lb (83 kg)  01/03/18 185 lb (83.9 kg)  11/21/17 187 lb 3.2 oz (84.9 kg)     Physical Exam Constitutional:      Appearance: Normal appearance. He is well-developed.  HENT:     Head: Normocephalic and atraumatic.  Eyes:     Pupils: Pupils are equal, round, and reactive to light.  Neck:     Musculoskeletal: Normal range of motion.     Thyroid: No thyromegaly.     Trachea: No tracheal deviation.  Cardiovascular:     Rate and Rhythm: Normal rate and regular rhythm.     Heart sounds: Normal heart sounds. No murmur. No friction rub. No gallop.   Pulmonary:     Breath sounds: Normal breath sounds. No wheezing or rales.  Abdominal:     General: Bowel sounds are normal. There is no distension.     Palpations: Abdomen is soft. There is no mass.     Tenderness: There is no abdominal tenderness.     Hernia: There is no hernia in the left inguinal area.  Genitourinary:    Penis: Normal.      Scrotum/Testes: Normal.  Musculoskeletal: Normal range of motion.        General: Tenderness (left rib 8-9 has tender  palpaable trigger spasm at post ax line) present.     Comments: Excellent muscle mass and tone   Lymphadenopathy:     Cervical: No cervical adenopathy.  Skin:    General: Skin is warm and dry.  Neurological:     Mental Status: He is alert and oriented to person, place, and time.       Assessment & Plan:   Jakim was seen today for annual exam.  Diagnoses and all orders for this visit:  Annual physical exam -     CBC with Differential/Platelet -     Urinalysis -     TSH -     PSA, total and free -     VITAMIN D 25 Hydroxy (Vit-D Deficiency, Fractures) -     DG Chest 2 View; Future  Pure hypercholesterolemia -     Lipid panel  Type 2 diabetes mellitus  without complication, without long-term current use of insulin (HCC) -     Bayer DCA Hb A1c Waived  Essential hypertension -     CMP14+EGFR  Ventral hernia without obstruction or gangrene  Rib pain on left side -     DG Ribs Unilateral W/Chest Left; Future      I have discontinued Sriman D. Sassone's doxycycline. I am also having him maintain his multivitamin with minerals, cholecalciferol, acetaminophen, glucose blood, OneTouch Delica Lancets 38V, fluticasone, triamcinolone cream, lisinopril-hydrochlorothiazide, rosuvastatin, and metFORMIN.  No orders of the defined types were placed in this encounter.    Follow-up: Return in about 3 months (around 12/19/2018).  Claretta Fraise, M.D.

## 2018-09-18 NOTE — Addendum Note (Signed)
Addended by: Rolena Infante on: 09/18/2018 10:29 AM   Modules accepted: Orders

## 2018-09-19 LAB — CBC WITH DIFFERENTIAL/PLATELET
Basophils Absolute: 0.1 10*3/uL (ref 0.0–0.2)
Basos: 1 %
EOS (ABSOLUTE): 0.2 10*3/uL (ref 0.0–0.4)
Eos: 5 %
Hematocrit: 42.5 % (ref 37.5–51.0)
Hemoglobin: 14.2 g/dL (ref 13.0–17.7)
Immature Grans (Abs): 0 10*3/uL (ref 0.0–0.1)
Immature Granulocytes: 0 %
Lymphocytes Absolute: 1.7 10*3/uL (ref 0.7–3.1)
Lymphs: 34 %
MCH: 29.6 pg (ref 26.6–33.0)
MCHC: 33.4 g/dL (ref 31.5–35.7)
MCV: 89 fL (ref 79–97)
Monocytes Absolute: 0.5 10*3/uL (ref 0.1–0.9)
Monocytes: 11 %
Neutrophils Absolute: 2.4 10*3/uL (ref 1.4–7.0)
Neutrophils: 49 %
Platelets: 389 10*3/uL (ref 150–450)
RBC: 4.8 x10E6/uL (ref 4.14–5.80)
RDW: 12.6 % (ref 11.6–15.4)
WBC: 4.9 10*3/uL (ref 3.4–10.8)

## 2018-09-19 LAB — LIPID PANEL
Chol/HDL Ratio: 2.3 ratio (ref 0.0–5.0)
Cholesterol, Total: 130 mg/dL (ref 100–199)
HDL: 57 mg/dL (ref >39)
LDL Calculated: 65 mg/dL (ref 0–99)
Triglycerides: 39 mg/dL (ref 0–149)
VLDL Cholesterol Cal: 8 mg/dL (ref 5–40)

## 2018-09-19 LAB — CMP14+EGFR
ALT: 19 IU/L (ref 0–44)
AST: 19 IU/L (ref 0–40)
Albumin/Globulin Ratio: 2.3 — ABNORMAL HIGH (ref 1.2–2.2)
Albumin: 4.8 g/dL (ref 3.8–4.9)
Alkaline Phosphatase: 43 IU/L (ref 39–117)
BUN/Creatinine Ratio: 19 (ref 9–20)
BUN: 18 mg/dL (ref 6–24)
Bilirubin Total: 0.6 mg/dL (ref 0.0–1.2)
CO2: 23 mmol/L (ref 20–29)
Calcium: 9.6 mg/dL (ref 8.7–10.2)
Chloride: 101 mmol/L (ref 96–106)
Creatinine, Ser: 0.96 mg/dL (ref 0.76–1.27)
GFR calc Af Amer: 105 mL/min/{1.73_m2} (ref >59)
GFR calc non Af Amer: 91 mL/min/{1.73_m2} (ref >59)
Globulin, Total: 2.1 g/dL (ref 1.5–4.5)
Glucose: 103 mg/dL — ABNORMAL HIGH (ref 65–99)
Potassium: 4.2 mmol/L (ref 3.5–5.2)
Sodium: 137 mmol/L (ref 134–144)
Total Protein: 6.9 g/dL (ref 6.0–8.5)

## 2018-09-19 LAB — PSA, TOTAL AND FREE
PSA, Free Pct: 30 %
PSA, Free: 0.18 ng/mL
Prostate Specific Ag, Serum: 0.6 ng/mL (ref 0.0–4.0)

## 2018-09-19 LAB — VITAMIN D 25 HYDROXY (VIT D DEFICIENCY, FRACTURES): Vit D, 25-Hydroxy: 72.8 ng/mL (ref 30.0–100.0)

## 2018-09-19 LAB — TSH: TSH: 2.55 u[IU]/mL (ref 0.450–4.500)

## 2018-09-19 NOTE — Progress Notes (Signed)
Hello [name] [name],  Your lab result is normal.Some minor variations that are not significant are commonly marked abnormal, but do not represent any medical problem for you.  Best regards, Claretta Fraise, M.D.

## 2018-09-27 ENCOUNTER — Telehealth: Payer: Self-pay | Admitting: Family Medicine

## 2018-09-28 ENCOUNTER — Other Ambulatory Visit: Payer: Self-pay | Admitting: Family Medicine

## 2018-09-28 MED ORDER — TRAMADOL HCL 50 MG PO TABS: 50.0000 mg | ORAL_TABLET | Freq: Four times a day (QID) | ORAL | 0 refills | Status: AC | PRN

## 2018-09-28 MED ORDER — DICLOFENAC SODIUM 75 MG PO TBEC: 75.0000 mg | DELAYED_RELEASE_TABLET | Freq: Two times a day (BID) | ORAL | 2 refills | Status: DC

## 2018-09-28 NOTE — Telephone Encounter (Signed)
Pt notified of RX 

## 2018-09-28 NOTE — Telephone Encounter (Signed)
I sent in the requested prescription 

## 2018-12-04 DIAGNOSIS — U071 COVID-19: Secondary | ICD-10-CM

## 2018-12-04 HISTORY — DX: COVID-19: U07.1

## 2018-12-14 ENCOUNTER — Other Ambulatory Visit: Payer: Self-pay | Admitting: Family Medicine

## 2019-01-03 ENCOUNTER — Other Ambulatory Visit: Payer: Self-pay | Admitting: Family Medicine

## 2019-02-11 LAB — HM DIABETES EYE EXAM

## 2019-03-12 ENCOUNTER — Encounter: Payer: Self-pay | Admitting: *Deleted

## 2019-03-19 ENCOUNTER — Encounter: Payer: Self-pay | Admitting: Internal Medicine

## 2019-03-19 ENCOUNTER — Ambulatory Visit: Payer: 59 | Admitting: Internal Medicine

## 2019-03-19 ENCOUNTER — Other Ambulatory Visit: Payer: Self-pay

## 2019-03-19 DIAGNOSIS — K648 Other hemorrhoids: Secondary | ICD-10-CM | POA: Diagnosis not present

## 2019-03-19 NOTE — Progress Notes (Signed)
[name] [name] is a 54 year old male with a history of symptomatic internal hemorrhoids who had hemorrhoidal banding on 09/26/2017 and 11/21/2017 to the left lateral and right anterior internal hemorrhoid who returns for follow-up due to recurrent symptomatic internal hemorrhoids  Banding went well and worked for over a year to control his symptoms.  He previously was having perianal swelling, prolapse and intermittent bleeding and itching.  On 2 occasions in the last month or so he could feel recurrent perianal bulge/swelling and has had 2 episodes of painless red blood with bowel movement.  Not necessarily provoked.  Bright red blood. Bowel habits have been mostly regular for him but this can be daily, he can skip a day or go 2 or 3 times in a day.  No bowel habit changes.  No abdominal pain or upper GI complaint.  ANOSCOPY: Using a disposable, lubricated, slotted, self-illuminating anoscope, the rectum was intubated without difficulty. The trochar was removed and the ano-rectum was circumferentially inspected. There were internal hemorrhoids, RA predominantly with small at LL, no significant hemorrhoid RP. There was no finding of an anorectal fissure. The rectal mucosa was not inflamed. No neoplasia or other pathology was identified. The inspection was well tolerated.    PROCEDURE NOTE:  The patient presents with symptomatic grade 2 internal hemorrhoids, requesting rubber band ligation of his hemorrhoidal disease.  All risks, benefits and alternative forms of therapy were described and informed consent was obtained.   The anorectum was pre-medicated with 0.125% nitroglycerin ointment The decision was made to band the RA internal hemorrhoid, and the [name] [name] was used to perform band ligation without complication.   Digital anorectal examination was then performed to assure proper positioning of the band, and to adjust the banded tissue as required.  The patient was discharged home without  pain or other issues.  Dietary and behavioral recommendations were given and along with follow-up instructions.     The patient will return as needed for follow-up and possible additional banding as required. No complications were encountered and the patient tolerated the procedure well.

## 2019-03-19 NOTE — Patient Instructions (Signed)
Please follow up as needed.  HEMORRHOID BANDING PROCEDURE    FOLLOW-UP CARE   1. The procedure you have had should have been relatively painless since the banding of the area involved does not have nerve endings and there is no pain sensation.  The rubber band cuts off the blood supply to the hemorrhoid and the band may fall off as soon as 48 hours after the banding (the band may occasionally be seen in the toilet bowl following a bowel movement). You may notice a temporary feeling of fullness in the rectum which should respond adequately to plain Tylenol or Motrin.  2. Following the banding, avoid strenuous exercise that evening and resume full activity the next day.  A sitz bath (soaking in a warm tub) or bidet is soothing, and can be useful for cleansing the area after bowel movements.     3. To avoid constipation, take two tablespoons of natural wheat bran, natural oat bran, flax, Benefiber or any over the counter fiber supplement and increase your water intake to 7-8 glasses daily.    4. Unless you have been prescribed anorectal medication, do not put anything inside your rectum for two weeks: No suppositories, enemas, fingers, etc.  5. Occasionally, you may have more bleeding than usual after the banding procedure.  This is often from the untreated hemorrhoids rather than the treated one.  Don't be concerned if there is a tablespoon or so of blood.  If there is more blood than this, lie flat with your bottom higher than your head and apply an ice pack to the area. If the bleeding does not stop within a half an hour or if you feel faint, call our office at [phone] or go to the emergency room.  6. Problems are not common; however, if there is a substantial amount of bleeding, severe pain, chills, fever or difficulty passing urine (very rare) or other problems, you should call us at [phone] or report to the nearest emergency room.  7. Do not stay seated continuously for more  than 2-3 hours for a day or two after the procedure.  Tighten your buttock muscles 10-15 times every two hours and take 10-15 deep breaths every 1-2 hours.  Do not spend more than a few minutes on the toilet if you cannot empty your bowel; instead re-visit the toilet at a later time.     

## 2019-04-03 ENCOUNTER — Other Ambulatory Visit: Payer: Self-pay | Admitting: Family Medicine

## 2019-10-01 ENCOUNTER — Other Ambulatory Visit: Payer: Self-pay | Admitting: Family Medicine

## 2019-10-01 NOTE — Telephone Encounter (Signed)
Yearly physical scheduled, patient reports he has enough medication to last until visit.

## 2019-10-01 NOTE — Telephone Encounter (Signed)
Last OV 09/2018 No follow up scheduled  ntbs for refills

## 2019-10-14 ENCOUNTER — Other Ambulatory Visit: Payer: Self-pay

## 2019-10-14 ENCOUNTER — Encounter: Payer: Self-pay | Admitting: Family Medicine

## 2019-10-14 ENCOUNTER — Ambulatory Visit (INDEPENDENT_AMBULATORY_CARE_PROVIDER_SITE_OTHER): Payer: 59 | Admitting: Family Medicine

## 2019-10-14 VITALS — BP 133/80 | HR 57 | Temp 98.3°F | Resp 20 | Ht 70.0 in | Wt 186.2 lb

## 2019-10-14 DIAGNOSIS — E785 Hyperlipidemia, unspecified: Secondary | ICD-10-CM

## 2019-10-14 DIAGNOSIS — I10 Essential (primary) hypertension: Secondary | ICD-10-CM | POA: Diagnosis not present

## 2019-10-14 DIAGNOSIS — E1169 Type 2 diabetes mellitus with other specified complication: Secondary | ICD-10-CM

## 2019-10-14 DIAGNOSIS — Z0001 Encounter for general adult medical examination with abnormal findings: Secondary | ICD-10-CM

## 2019-10-14 DIAGNOSIS — E7800 Pure hypercholesterolemia, unspecified: Secondary | ICD-10-CM | POA: Diagnosis not present

## 2019-10-14 DIAGNOSIS — E119 Type 2 diabetes mellitus without complications: Secondary | ICD-10-CM | POA: Diagnosis not present

## 2019-10-14 DIAGNOSIS — Z0000 Encounter for general adult medical examination without abnormal findings: Secondary | ICD-10-CM

## 2019-10-14 DIAGNOSIS — E559 Vitamin D deficiency, unspecified: Secondary | ICD-10-CM

## 2019-10-14 DIAGNOSIS — Z125 Encounter for screening for malignant neoplasm of prostate: Secondary | ICD-10-CM

## 2019-10-14 LAB — BAYER DCA HB A1C WAIVED: HB A1C (BAYER DCA - WAIVED): 6 % (ref <7.0)

## 2019-10-14 MED ORDER — PREDNISONE 10 MG PO TABS
ORAL_TABLET | ORAL | 0 refills | Status: DC
Start: 2019-10-14 — End: 2020-10-14

## 2019-10-14 NOTE — Progress Notes (Signed)
Subjective:  Patient ID: [name] [name],  male    DOB: 1965/09/08  Age: 54 y.o.    CC: Annual Exam   HPI [name] [name] presents for complete physical exam.  Follow-up of hypertension. Patient has no history of headache chest pain or shortness of breath or recent cough. Patient also denies symptoms of TIA such as numbness weakness lateralizing. Patient denies side effects from medication. States taking it regularly.  Patient also  in for follow-up of elevated cholesterol. Doing well without complaints on current medication. Denies side effects  including myalgia and arthralgia and nausea. Also in today for liver function testing. Currently no chest pain, shortness of breath or other cardiovascular related symptoms noted.  Follow-up of diabetes. Patient denies symptoms such as excessive hunger or urinary frequency, excessive hunger, nausea No significant hypoglycemic spells noted. Medications reviewed. Pt reports taking them regularly. Expresses concern for metformin safety.   History [name] [name] has a past medical history of Chronic pain, COVID-19 (12/2018), Dental crown present, Diabetes mellitus without complication ([place]), Diverticulosis, Hypertension, and Internal hemorrhoids.   He has a past surgical history that includes Right shoulder surgery (2010); Right Bicep surgery (2008); Ganglion cyst excision (2009); Shoulder arthroscopy (98,09); Colonoscopy; Ganglion cyst excision (Left, 01/17/2013); and Carpal tunnel release (Right, 03/06/2014).   His family history includes Diabetes in his mother; Heart disease in his mother; Kidney cancer in his father; Stroke in his mother.He reports that he has never smoked. He has never used smokeless tobacco. He reports that he does not drink alcohol and does not use drugs.  Current Outpatient Medications on File Prior to Visit  Medication Sig Dispense Refill  . acetaminophen (TYLENOL) 500 MG tablet Take 500 mg by mouth every 6 (six) hours as needed.    .  cholecalciferol (VITAMIN D) 1000 UNITS tablet Take 1,000 Units by mouth daily. 5000    . fluticasone (FLONASE) 50 MCG/ACT nasal spray PLACE 1 SPRAY INTO BOTH NOSTRILS DAILY. 48 mL 3  . glucose blood test strip Use to check BG daily 100 each 3  . lisinopril-hydrochlorothiazide (ZESTORETIC) 20-25 MG tablet TAKE 1 TABLET BY MOUTH EVERY DAY 90 tablet 1  . metFORMIN (GLUCOPHAGE) 500 MG tablet TAKE 1 TABLET (500 MG TOTAL) BY MOUTH 2 (TWO) TIMES DAILY WITH A MEAL. 180 tablet 1  . Multiple Vitamins-Minerals (MULTIVITAMIN WITH MINERALS) tablet Take 1 tablet by mouth daily.    [name] DELICA LANCETS 69V MISC Use to check BG daily 100 each 3  . rosuvastatin (CRESTOR) 5 MG tablet TAKE 1 TAB BY MOUTH ON MON, WED AND FRI EVENINGS 36 tablet 1  . triamcinolone cream (KENALOG) 0.1 % Apply 1 application topically 2 (two) times daily. (Patient taking differently: Apply 1 application topically 2 (two) times daily as needed. ) 80 g 2   No current facility-administered medications on file prior to visit.    ROS Review of Systems  Constitutional: Negative for activity change, fatigue and unexpected weight change.  HENT: Negative for congestion, ear pain, hearing loss, postnasal drip and trouble swallowing.   Eyes: Negative for pain and visual disturbance.  Respiratory: Negative for cough, chest tightness and shortness of breath.   Cardiovascular: Negative for chest pain, palpitations and leg swelling.  Gastrointestinal: Negative for abdominal distention, abdominal pain, blood in stool, constipation, diarrhea, nausea and vomiting.  Endocrine: Negative for cold intolerance, heat intolerance and polydipsia.  Genitourinary: Negative for difficulty urinating, dysuria, flank pain, frequency and urgency.  Musculoskeletal: Negative for arthralgias and joint swelling.  Skin: Positive for rash (Nonpruritic eruption over the legs and lower thighs.  [place]  The rash started the  very next day.). Negative for color change and wound.  Neurological: Negative for dizziness, syncope, speech difficulty, weakness, light-headedness, numbness and headaches.  Hematological: Does not bruise/bleed easily.  Psychiatric/Behavioral: Negative for confusion, decreased concentration, dysphoric mood and sleep disturbance. The patient is not nervous/anxious.     Objective:  BP 133/80   Pulse (!) 57   Temp 98.3 F (36.8 C) (Temporal)   Resp 20   Ht 5' 10"  (1.778 m)   Wt 186 lb 4 oz (84.5 kg)   SpO2 99%   BMI 26.72 kg/m   BP Readings from Last 3 Encounters:  10/14/19 133/80  03/19/19 138/78  09/18/18 134/74    Wt Readings from Last 3 Encounters:  10/14/19 186 lb 4 oz (84.5 kg)  03/19/19 190 lb (86.2 kg)  09/18/18 183 lb (83 kg)     Physical Exam Constitutional:      Appearance: He is well-developed.  HENT:     Head: Normocephalic and atraumatic.  Eyes:     Pupils: Pupils are equal, round, and reactive to light.  Neck:     Thyroid: No thyromegaly.     Trachea: No tracheal deviation.  Cardiovascular:     Rate and Rhythm: Normal rate and regular rhythm.     Heart sounds: Normal heart sounds. No murmur heard.  No friction rub. No gallop.   Pulmonary:     Breath sounds: Normal breath sounds. No wheezing or rales.  Abdominal:     General: Bowel sounds are normal. There is no distension.     Palpations: Abdomen is soft. There is no mass.     Tenderness: There is no abdominal tenderness.     Hernia: There is no hernia in the left inguinal area.  Genitourinary:    Penis: Normal.      Testes: Normal.  Musculoskeletal:        General: Normal range of motion.     Cervical back: Normal range of motion.  Lymphadenopathy:     Cervical: No cervical adenopathy.  Skin:    General: Skin is warm and dry.     Findings: Rash (Papular eruption both legs, to the mid thigh) present.  Neurological:     Mental Status: He is alert and oriented to person, place, and time.      Diabetic Foot Exam - Simple   No data filed        Assessment & Plan:   Alastor was seen today for annual exam.  Diagnoses and all orders for this visit:  Annual physical exam -     CBC with Differential/Platelet -     CMP14+EGFR -     Lipid panel -     PSA, total and free  Pure hypercholesterolemia -     CBC with Differential/Platelet -     CMP14+EGFR -     Lipid panel  Type 2 diabetes mellitus without complication, without long-term current use of insulin (HCC) -     Microalbumin / creatinine urine ratio -     Bayer DCA Hb A1c Waived -     CBC with Differential/Platelet -     CMP14+EGFR -     Lipid panel  Essential hypertension -     CBC with Differential/Platelet -     CMP14+EGFR -     Lipid  panel  Vitamin D deficiency -     VITAMIN D 25 Hydroxy (Vit-D Deficiency, Fractures)  Dyslipidemia associated with type 2 diabetes mellitus ([place])  Screening for prostate cancer -     PSA Total (Reflex To Free)  Other orders -     predniSONE (DELTASONE) 10 MG tablet; Take 5 daily for 2 days followed by 4,3,2 and 1 for 2 days each.   I am having Maysen D. Bache start on predniSONE. I am also having him maintain his multivitamin with minerals, cholecalciferol, acetaminophen, glucose blood, OneTouch Delica Lancets 61R, triamcinolone cream, rosuvastatin, fluticasone, metFORMIN, and lisinopril-hydrochlorothiazide.  Meds ordered this encounter  Medications  . predniSONE (DELTASONE) 10 MG tablet    Sig: Take 5 daily for 2 days followed by 4,3,2 and 1 for 2 days each.    Dispense:  30 tablet    Refill:  0     Follow-up: No follow-ups on file.  Claretta Fraise, M.D.

## 2019-10-15 LAB — LIPID PANEL
Chol/HDL Ratio: 3.5 ratio (ref 0.0–5.0)
Cholesterol, Total: 152 mg/dL (ref 100–199)
HDL: 44 mg/dL (ref >39)
LDL Chol Calc (NIH): 95 mg/dL (ref 0–99)
Triglycerides: 64 mg/dL (ref 0–149)
VLDL Cholesterol Cal: 13 mg/dL (ref 5–40)

## 2019-10-15 LAB — CBC WITH DIFFERENTIAL/PLATELET
Basophils Absolute: 0.1 10*3/uL (ref 0.0–0.2)
Basos: 1 %
EOS (ABSOLUTE): 0.2 10*3/uL (ref 0.0–0.4)
Eos: 4 %
Hematocrit: 42.9 % (ref 37.5–51.0)
Hemoglobin: 14.4 g/dL (ref 13.0–17.7)
Immature Grans (Abs): 0 10*3/uL (ref 0.0–0.1)
Immature Granulocytes: 0 %
Lymphocytes Absolute: 1.7 10*3/uL (ref 0.7–3.1)
Lymphs: 35 %
MCH: 29.8 pg (ref 26.6–33.0)
MCHC: 33.6 g/dL (ref 31.5–35.7)
MCV: 89 fL (ref 79–97)
Monocytes Absolute: 0.6 10*3/uL (ref 0.1–0.9)
Monocytes: 12 %
Neutrophils Absolute: 2.4 10*3/uL (ref 1.4–7.0)
Neutrophils: 48 %
Platelets: 329 10*3/uL (ref 150–450)
RBC: 4.83 x10E6/uL (ref 4.14–5.80)
RDW: 12.4 % (ref 11.6–15.4)
WBC: 5 10*3/uL (ref 3.4–10.8)

## 2019-10-15 LAB — CMP14+EGFR
ALT: 15 IU/L (ref 0–44)
AST: 16 IU/L (ref 0–40)
Albumin/Globulin Ratio: 2.1 (ref 1.2–2.2)
Albumin: 4.5 g/dL (ref 3.8–4.9)
Alkaline Phosphatase: 44 IU/L — ABNORMAL LOW (ref 48–121)
BUN/Creatinine Ratio: 20 (ref 9–20)
BUN: 19 mg/dL (ref 6–24)
Bilirubin Total: 0.6 mg/dL (ref 0.0–1.2)
CO2: 24 mmol/L (ref 20–29)
Calcium: 9.3 mg/dL (ref 8.7–10.2)
Chloride: 98 mmol/L (ref 96–106)
Creatinine, Ser: 0.93 mg/dL (ref 0.76–1.27)
GFR calc Af Amer: 107 mL/min/{1.73_m2} (ref >59)
GFR calc non Af Amer: 93 mL/min/{1.73_m2} (ref >59)
Globulin, Total: 2.1 g/dL (ref 1.5–4.5)
Glucose: 114 mg/dL — ABNORMAL HIGH (ref 65–99)
Potassium: 4.1 mmol/L (ref 3.5–5.2)
Sodium: 137 mmol/L (ref 134–144)
Total Protein: 6.6 g/dL (ref 6.0–8.5)

## 2019-10-15 LAB — VITAMIN D 25 HYDROXY (VIT D DEFICIENCY, FRACTURES): Vit D, 25-Hydroxy: 78.1 ng/mL (ref 30.0–100.0)

## 2019-10-15 LAB — PSA, TOTAL AND FREE
PSA, Free Pct: 36.7 %
PSA, Free: 0.22 ng/mL
Prostate Specific Ag, Serum: 0.6 ng/mL (ref 0.0–4.0)

## 2019-10-16 NOTE — Progress Notes (Signed)
Hello [name] [name],  Your lab result is normal and/or stable.Some minor variations that are not significant are commonly marked abnormal, but do not represent any medical problem for you.  Best regards, Daiya Tamer, M.D.

## 2019-10-29 ENCOUNTER — Telehealth: Payer: Self-pay | Admitting: Family Medicine

## 2019-10-29 MED ORDER — METFORMIN HCL 500 MG PO TABS: 500.0000 mg | ORAL_TABLET | Freq: Two times a day (BID) | ORAL | 0 refills | Status: DC

## 2019-10-29 MED ORDER — ROSUVASTATIN CALCIUM 5 MG PO TABS: ORAL_TABLET | ORAL | 1 refills | Status: DC

## 2019-10-29 MED ORDER — LISINOPRIL-HYDROCHLOROTHIAZIDE 20-25 MG PO TABS: 1.0000 | ORAL_TABLET | Freq: Every day | ORAL | 1 refills | Status: DC

## 2019-10-29 MED ORDER — FLUTICASONE PROPIONATE 50 MCG/ACT NA SUSP: 1.0000 | Freq: Every day | NASAL | 3 refills | Status: DC

## 2019-10-29 NOTE — Telephone Encounter (Signed)
Pt aware refills sent to pharmacy 

## 2019-10-29 NOTE — Telephone Encounter (Signed)
  Prescription Request  10/29/2019  What is the name of the medication or equipment? Metformin & Lisinopril, Flonase  Have you contacted your pharmacy to request a refill? (if applicable) Yes  Which pharmacy would you like this sent to? [facility]   Patient notified that their request is being sent to the clinical staff for review and that they should receive a response within 2 business days.   [facility] pt.  Pt has already seen [facility] for CPE in July 2021.  Please call him.

## 2020-01-21 ENCOUNTER — Other Ambulatory Visit: Payer: Self-pay | Admitting: Family Medicine

## 2020-04-15 ENCOUNTER — Ambulatory Visit: Payer: 59 | Admitting: Family Medicine

## 2020-04-19 ENCOUNTER — Other Ambulatory Visit: Payer: Self-pay | Admitting: Family Medicine

## 2020-04-21 ENCOUNTER — Other Ambulatory Visit: Payer: Self-pay | Admitting: Family Medicine

## 2020-05-03 ENCOUNTER — Other Ambulatory Visit: Payer: Self-pay | Admitting: Family Medicine

## 2020-05-07 ENCOUNTER — Ambulatory Visit: Payer: 59 | Admitting: Family Medicine

## 2020-05-22 ENCOUNTER — Ambulatory Visit: Payer: 59 | Admitting: Family Medicine

## 2020-05-22 DIAGNOSIS — E119 Type 2 diabetes mellitus without complications: Secondary | ICD-10-CM

## 2020-05-22 DIAGNOSIS — E7800 Pure hypercholesterolemia, unspecified: Secondary | ICD-10-CM

## 2020-05-22 DIAGNOSIS — I10 Essential (primary) hypertension: Secondary | ICD-10-CM

## 2020-05-22 DIAGNOSIS — Z1159 Encounter for screening for other viral diseases: Secondary | ICD-10-CM

## 2020-06-22 ENCOUNTER — Ambulatory Visit: Payer: 59 | Admitting: Family Medicine

## 2020-07-27 ENCOUNTER — Other Ambulatory Visit: Payer: Self-pay | Admitting: Family Medicine

## 2020-08-05 ENCOUNTER — Other Ambulatory Visit: Payer: Self-pay | Admitting: Family Medicine

## 2020-08-10 LAB — HM DIABETES EYE EXAM

## 2020-09-25 ENCOUNTER — Other Ambulatory Visit: Payer: Self-pay | Admitting: Family Medicine

## 2020-10-06 ENCOUNTER — Ambulatory Visit (INDEPENDENT_AMBULATORY_CARE_PROVIDER_SITE_OTHER): Payer: 59 | Admitting: Physician Assistant

## 2020-10-06 ENCOUNTER — Encounter: Payer: Self-pay | Admitting: Physician Assistant

## 2020-10-06 DIAGNOSIS — J069 Acute upper respiratory infection, unspecified: Secondary | ICD-10-CM | POA: Diagnosis not present

## 2020-10-06 MED ORDER — BENZONATATE 100 MG PO CAPS: 100.0000 mg | ORAL_CAPSULE | Freq: Three times a day (TID) | ORAL | 0 refills | Status: DC | PRN

## 2020-10-06 MED ORDER — AMOXICILLIN 875 MG PO TABS: 875.0000 mg | ORAL_TABLET | Freq: Two times a day (BID) | ORAL | 0 refills | Status: DC

## 2020-10-06 NOTE — Progress Notes (Signed)
   Virtual Visit  Note Due to COVID-19 pandemic this visit was conducted virtually. This visit type was conducted due to national recommendations for restrictions regarding the COVID-19 Pandemic (e.g. social distancing, sheltering in place) in an effort to limit this patient's exposure and mitigate transmission in our community. All issues noted in this document were discussed and addressed.  A physical exam was not performed with this format.  I connected with [name] [name] on 10/06/20 by telephone and verified that I am speaking with the correct person using two identifiers. [name] [name] is currently located at home and  is currently with no one during visit. The provider, Thomasene Mohair, PA-C is located in their office at time of visit.  I discussed the limitations, risks, security and privacy concerns of performing an evaluation and management service by telephone and the availability of in person appointments. I also discussed with the patient that there may be a patient responsible charge related to this service. The patient expressed understanding and agreed to proceed.   History and Present Illness:  Pt with onset of cough , conegston and fatigue on Fri Currently sing OTC meds with minimal releief He has taken several at home test that are negative + vacc/booster - Pfizer + hx of COVID 1.5 years ago      Review of Systems  Constitutional:  Positive for chills and malaise/fatigue.  HENT:  Positive for congestion.   Respiratory:  Positive for cough.   Cardiovascular: Negative.     Observations/Objective: defer  Assessment and Plan: URI  Amox 875mg  bid x 10 days Tessalon rx Fluids Rest F/U prn  Follow Up Instructions: prn    I discussed the assessment and treatment plan with the patient. The patient was provided an opportunity to ask questions and all were answered. The patient agreed with the plan and demonstrated an understanding of the instructions.   The patient  was advised to call back or seek an in-person evaluation if the symptoms worsen or if the condition fails to improve as anticipated.  The above assessment and management plan was discussed with the patient. The patient verbalized understanding of and has agreed to the management plan. Patient is aware to call the clinic if symptoms persist or worsen. Patient is aware when to return to the clinic for a follow-up visit. Patient educated on when it is appropriate to go to the emergency department.   Time call ended:    I provided 11 minutes of  non face-to-face time during this encounter.    Thomasene Mohair, PA-C

## 2020-10-06 NOTE — Patient Instructions (Signed)
Upper Respiratory Infection, Adult  An upper respiratory infection (URI) affects the nose, throat, and upper air passages. URIs are caused by germs (viruses). The most common type of URI is often called "the common cold."  Medicines cannot cure URIs, but you can do things at home to relieve your symptoms. URIs usually get better within 7-10 days.  Follow these instructions at home:  Activity  Rest as needed.  If you have a fever, stay home from work or school until your fever is gone, or until your doctor says you may return to work or school.  You should stay home until you cannot spread the infection anymore (you are not contagious).  Your doctor may have you wear a face mask so you have less risk of spreading the infection.  Relieving symptoms  Gargle with a salt-water mixture 3-4 times a day or as needed. To make a salt-water mixture, completely dissolve -1 tsp of salt in 1 cup of warm water.  Use a cool-mist humidifier to add moisture to the air. This can help you breathe more easily.  Eating and drinking    Drink enough fluid to keep your pee (urine) pale yellow.  Eat soups and other clear broths.  General instructions    Take over-the-counter and prescription medicines only as told by your doctor. These include cold medicines, fever reducers, and cough suppressants.  Do not use any products that contain nicotine or tobacco. These include cigarettes and e-cigarettes. If you need help quitting, ask your doctor.  Avoid being where people are smoking (avoid secondhand smoke).  Make sure you get regular shots and get the flu shot every year.  Keep all follow-up visits as told by your doctor. This is important.  How to avoid spreading infection to others    Wash your hands often with soap and water. If you do not have soap and water, use hand sanitizer.  Avoid touching your mouth, face, eyes, or nose.  Cough or sneeze into a tissue or your sleeve or elbow. Do not cough or sneeze into your hand or into the  air.  Contact a doctor if:  You are getting worse, not better.  You have any of these:  A fever.  Chills.  Brown or red mucus in your nose.  Yellow or brown fluid (discharge)coming from your nose.  Pain in your face, especially when you bend forward.  Swollen neck glands.  Pain with swallowing.  White areas in the back of your throat.  Get help right away if:  You have shortness of breath that gets worse.  You have very bad or constant:  Headache.  Ear pain.  Pain in your forehead, behind your eyes, and over your cheekbones (sinus pain).  Chest pain.  You have long-lasting (chronic) lung disease along with any of these:  Wheezing.  Long-lasting cough.  Coughing up blood.  A change in your usual mucus.  You have a stiff neck.  You have changes in your:  Vision.  Hearing.  Thinking.  Mood.  Summary  An upper respiratory infection (URI) is caused by a germ called a virus. The most common type of URI is often called "the common cold."  URIs usually get better within 7-10 days.  Take over-the-counter and prescription medicines only as told by your doctor.  This information is not intended to replace advice given to you by your health care provider. Make sure you discuss any questions you have with your health care provider.  Document 

## 2020-10-14 ENCOUNTER — Encounter: Payer: Self-pay | Admitting: Family Medicine

## 2020-10-14 ENCOUNTER — Ambulatory Visit (INDEPENDENT_AMBULATORY_CARE_PROVIDER_SITE_OTHER): Payer: 59 | Admitting: Family Medicine

## 2020-10-14 ENCOUNTER — Other Ambulatory Visit: Payer: Self-pay

## 2020-10-14 VITALS — BP 121/79 | HR 62 | Temp 97.6°F | Ht 70.0 in | Wt 181.4 lb

## 2020-10-14 DIAGNOSIS — Z23 Encounter for immunization: Secondary | ICD-10-CM

## 2020-10-14 DIAGNOSIS — E119 Type 2 diabetes mellitus without complications: Secondary | ICD-10-CM | POA: Diagnosis not present

## 2020-10-14 DIAGNOSIS — Z1159 Encounter for screening for other viral diseases: Secondary | ICD-10-CM

## 2020-10-14 DIAGNOSIS — Z0001 Encounter for general adult medical examination with abnormal findings: Secondary | ICD-10-CM | POA: Diagnosis not present

## 2020-10-14 DIAGNOSIS — E559 Vitamin D deficiency, unspecified: Secondary | ICD-10-CM

## 2020-10-14 DIAGNOSIS — E7800 Pure hypercholesterolemia, unspecified: Secondary | ICD-10-CM | POA: Diagnosis not present

## 2020-10-14 DIAGNOSIS — I10 Essential (primary) hypertension: Secondary | ICD-10-CM

## 2020-10-14 DIAGNOSIS — Z125 Encounter for screening for malignant neoplasm of prostate: Secondary | ICD-10-CM

## 2020-10-14 LAB — BAYER DCA HB A1C WAIVED: HB A1C (BAYER DCA - WAIVED): 6.8 % (ref <7.0)

## 2020-10-14 MED ORDER — ROSUVASTATIN CALCIUM 5 MG PO TABS: ORAL_TABLET | ORAL | 1 refills | Status: DC

## 2020-10-14 MED ORDER — METFORMIN HCL 500 MG PO TABS: 500.0000 mg | ORAL_TABLET | Freq: Two times a day (BID) | ORAL | 3 refills | Status: DC

## 2020-10-14 MED ORDER — LISINOPRIL-HYDROCHLOROTHIAZIDE 20-25 MG PO TABS: 1.0000 | ORAL_TABLET | Freq: Every day | ORAL | 3 refills | Status: DC

## 2020-10-14 NOTE — Progress Notes (Signed)
Subjective:  Patient ID: [name] [name], male    DOB: 04/03/1966  Age: 55 y.o. MRN: [id number]  CC: Annual Exam   HPI [name] [name] presents for Annual Exam.   Wants to have Penile sprain treated. "I am aware of it." Causes pain with sex if position isn't right. [place] if she moves the wrong way while he is inserted.   Uncontrolled, overboard nighttime erections. Caused pain if he rolled over in his sleep. Urologist just gave him ibuprofen and Flomax. .  Wants to see a different urologist for a second opinion.  Taking cholesterol med 2-3 times a week.   Depression screen [facility] 2/9 10/14/2020 10/14/2019 09/18/2018  Decreased Interest 0 0 0  Down, Depressed, Hopeless 0 0 0  PHQ - 2 Score 0 0 0    History [name] [name] has a past medical history of Chronic pain, COVID-19 (12/2018), Dental crown present, Diabetes mellitus without complication ([place]), Diverticulosis, Hypertension, and Internal hemorrhoids.   He has a past surgical history that includes Right shoulder surgery (2010); Right Bicep surgery (2008); Ganglion cyst excision (2009); Shoulder arthroscopy (98,09); Colonoscopy; Ganglion cyst excision (Left, 01/17/2013); and Carpal tunnel release (Right, 03/06/2014).   His family history includes Diabetes in his mother; Heart disease in his mother; Kidney cancer in his father; Stroke in his mother.He reports that he has never smoked. He has never used smokeless tobacco. He reports that he does not drink alcohol and does not use drugs.    ROS Review of Systems  Constitutional:  Negative for activity change, fatigue and unexpected weight change.  HENT:  Negative for congestion, ear pain, hearing loss, postnasal drip and trouble swallowing.   Eyes:  Negative for pain and visual disturbance.  Respiratory:  Negative for cough, chest tightness and shortness of breath.   Cardiovascular:  Negative for chest pain, palpitations and leg swelling.  Gastrointestinal:  Negative for abdominal distention,  abdominal pain, blood in stool, constipation, diarrhea, nausea and vomiting.  Endocrine: Negative for cold intolerance, heat intolerance and polydipsia.  Genitourinary:  Positive for penile pain. Negative for difficulty urinating, dysuria, flank pain, frequency and urgency.  Musculoskeletal:  Positive for arthralgias (seeing ortho for right shoulder. Did shoulder injections. Still painful.). Negative for joint swelling.  Skin:  Negative for color change, rash and wound.  Neurological:  Negative for dizziness, syncope, speech difficulty, weakness, light-headedness, numbness and headaches.  Hematological:  Does not bruise/bleed easily.  Psychiatric/Behavioral:  Negative for confusion, decreased concentration, dysphoric mood and sleep disturbance. The patient is not nervous/anxious.    Objective:  BP 121/79   Pulse 62   Temp 97.6 F (36.4 C)   Ht 5' 10"  (1.778 m)   Wt 181 lb 6.4 oz (82.3 kg)   SpO2 98%   BMI 26.03 kg/m   BP Readings from Last 3 Encounters:  10/14/20 121/79  10/14/19 133/80  03/19/19 138/78    Wt Readings from Last 3 Encounters:  10/14/20 181 lb 6.4 oz (82.3 kg)  10/14/19 186 lb 4 oz (84.5 kg)  03/19/19 190 lb (86.2 kg)     Physical Exam Constitutional:      Appearance: He is well-developed.  HENT:     Head: Normocephalic and atraumatic.  Eyes:     Pupils: Pupils are equal, round, and reactive to light.  Neck:     Thyroid: No thyromegaly.     Trachea: No tracheal deviation.  Cardiovascular:     Rate and Rhythm: Normal rate and regular rhythm.  Heart sounds: Normal heart sounds. No murmur heard.   No friction rub. No gallop.  Pulmonary:     Breath sounds: Normal breath sounds. No wheezing or rales.  Abdominal:     General: Bowel sounds are normal. There is no distension.     Palpations: Abdomen is soft. There is no mass.     Tenderness: There is no abdominal tenderness.     Hernia: There is no hernia in the left inguinal area.  Genitourinary:     Penis: Normal.      Testes: Normal.  Musculoskeletal:        General: Normal range of motion.     Cervical back: Normal range of motion.  Lymphadenopathy:     Cervical: No cervical adenopathy.  Skin:    General: Skin is warm and dry.  Neurological:     Mental Status: He is alert and oriented to person, place, and time.      Assessment & Plan:   [name] [name] was seen today for annual exam.  Diagnoses and all orders for this visit:  Need for hepatitis C screening test -     Hepatitis C antibody  Pure hypercholesterolemia -     rosuvastatin (CRESTOR) 5 MG tablet; TAKE 1 TABLET BY MOUTH ON MON, WED AND FRI EVENINGS -     Lipid panel  Type 2 diabetes mellitus without complication, without long-term current use of insulin (HCC) -     metFORMIN (GLUCOPHAGE) 500 MG tablet; Take 1 tablet (500 mg total) by mouth 2 (two) times daily with a meal. -     Bayer DCA Hb A1c Waived -     CBC with Differential/Platelet -     CMP14+EGFR  Essential hypertension -     lisinopril-hydrochlorothiazide (ZESTORETIC) 20-25 MG tablet; Take 1 tablet by mouth daily.  Vitamin D deficiency -     VITAMIN D 25 Hydroxy (Vit-D Deficiency, Fractures)  Screening for prostate cancer -     PSA, total and free  Primary hypertension      I have discontinued [name] [name]'s predniSONE, amoxicillin, and benzonatate. I have also changed his lisinopril-hydrochlorothiazide and metFORMIN. Additionally, I am having him maintain his multivitamin with minerals, cholecalciferol, acetaminophen, glucose blood, OneTouch Delica Lancets 63O, triamcinolone cream, fluticasone, and rosuvastatin.  Allergies as of 10/14/2020   No Known Allergies      Medication List        Accurate as of October 14, 2020  9:59 AM. If you have any questions, ask your nurse or doctor.          STOP taking these medications    amoxicillin 875 MG tablet Commonly known as: AMOXIL Stopped by: Claretta Fraise, MD   benzonatate 100 MG  capsule Commonly known as: Best boy Stopped by: Claretta Fraise, MD   predniSONE 10 MG tablet Commonly known as: DELTASONE Stopped by: Claretta Fraise, MD       TAKE these medications    acetaminophen 500 MG tablet Commonly known as: TYLENOL Take 500 mg by mouth every 6 (six) hours as needed.   cholecalciferol 1000 units tablet Commonly known as: VITAMIN D Take 1,000 Units by mouth daily. 5000   fluticasone 50 MCG/ACT nasal spray Commonly known as: FLONASE PLACE 1 SPRAY INTO BOTH NOSTRILS DAILY.   glucose blood test strip Use to check BG daily   lisinopril-hydrochlorothiazide 20-25 MG tablet Commonly known as: ZESTORETIC Take 1 tablet by mouth daily.   metFORMIN 500 MG tablet Commonly known as: GLUCOPHAGE Take  1 tablet (500 mg total) by mouth 2 (two) times daily with a meal.   multivitamin with minerals tablet Take 1 tablet by mouth daily.   OneTouch Delica Lancets 06O Misc Use to check BG daily   rosuvastatin 5 MG tablet Commonly known as: CRESTOR TAKE 1 TABLET BY MOUTH ON MON, WED AND FRI EVENINGS   triamcinolone cream 0.1 % Commonly known as: KENALOG Apply 1 application topically 2 (two) times daily. What changed:  when to take this reasons to take this         Follow-up: Return in about 6 months (around 04/16/2021).  Claretta Fraise, M.D.

## 2020-10-14 NOTE — Addendum Note (Signed)
Addended by: Baldomero Lamy B on: 10/14/2020 10:57 AM   Modules accepted: Orders

## 2020-10-15 LAB — CMP14+EGFR
ALT: 25 IU/L (ref 0–44)
AST: 18 IU/L (ref 0–40)
Albumin/Globulin Ratio: 2.1 (ref 1.2–2.2)
Albumin: 4.5 g/dL (ref 3.8–4.9)
Alkaline Phosphatase: 47 IU/L (ref 44–121)
BUN/Creatinine Ratio: 14 (ref 9–20)
BUN: 13 mg/dL (ref 6–24)
Bilirubin Total: 0.5 mg/dL (ref 0.0–1.2)
CO2: 25 mmol/L (ref 20–29)
Calcium: 9.7 mg/dL (ref 8.7–10.2)
Chloride: 100 mmol/L (ref 96–106)
Creatinine, Ser: 0.94 mg/dL (ref 0.76–1.27)
Globulin, Total: 2.1 g/dL (ref 1.5–4.5)
Glucose: 131 mg/dL — ABNORMAL HIGH (ref 65–99)
Potassium: 4.9 mmol/L (ref 3.5–5.2)
Sodium: 138 mmol/L (ref 134–144)
Total Protein: 6.6 g/dL (ref 6.0–8.5)
eGFR: 96 mL/min/{1.73_m2} (ref >59)

## 2020-10-15 LAB — CBC WITH DIFFERENTIAL/PLATELET
Basophils Absolute: 0.1 10*3/uL (ref 0.0–0.2)
Basos: 1 %
EOS (ABSOLUTE): 0.1 10*3/uL (ref 0.0–0.4)
Eos: 2 %
Hematocrit: 42 % (ref 37.5–51.0)
Hemoglobin: 14.5 g/dL (ref 13.0–17.7)
Immature Grans (Abs): 0 10*3/uL (ref 0.0–0.1)
Immature Granulocytes: 1 %
Lymphocytes Absolute: 2.1 10*3/uL (ref 0.7–3.1)
Lymphs: 30 %
MCH: 30 pg (ref 26.6–33.0)
MCHC: 34.5 g/dL (ref 31.5–35.7)
MCV: 87 fL (ref 79–97)
Monocytes Absolute: 0.7 10*3/uL (ref 0.1–0.9)
Monocytes: 10 %
Neutrophils Absolute: 4 10*3/uL (ref 1.4–7.0)
Neutrophils: 56 %
Platelets: 373 10*3/uL (ref 150–450)
RBC: 4.83 x10E6/uL (ref 4.14–5.80)
RDW: 12.3 % (ref 11.6–15.4)
WBC: 7 10*3/uL (ref 3.4–10.8)

## 2020-10-15 LAB — LIPID PANEL
Chol/HDL Ratio: 2.9 ratio (ref 0.0–5.0)
Cholesterol, Total: 141 mg/dL (ref 100–199)
HDL: 48 mg/dL (ref >39)
LDL Chol Calc (NIH): 81 mg/dL (ref 0–99)
Triglycerides: 58 mg/dL (ref 0–149)
VLDL Cholesterol Cal: 12 mg/dL (ref 5–40)

## 2020-10-15 LAB — PSA, TOTAL AND FREE
PSA, Free Pct: 20 %
PSA, Free: 0.14 ng/mL
Prostate Specific Ag, Serum: 0.7 ng/mL (ref 0.0–4.0)

## 2020-10-15 LAB — HEPATITIS C ANTIBODY: Hep C Virus Ab: 0.1 s/co ratio (ref 0.0–0.9)

## 2020-10-15 LAB — VITAMIN D 25 HYDROXY (VIT D DEFICIENCY, FRACTURES): Vit D, 25-Hydroxy: 65.8 ng/mL (ref 30.0–100.0)

## 2020-12-10 ENCOUNTER — Encounter: Payer: Self-pay | Admitting: Podiatry

## 2020-12-10 ENCOUNTER — Other Ambulatory Visit: Payer: Self-pay

## 2020-12-10 ENCOUNTER — Ambulatory Visit: Payer: 59 | Admitting: Podiatry

## 2020-12-10 ENCOUNTER — Ambulatory Visit (INDEPENDENT_AMBULATORY_CARE_PROVIDER_SITE_OTHER): Payer: 59

## 2020-12-10 DIAGNOSIS — M25572 Pain in left ankle and joints of left foot: Secondary | ICD-10-CM

## 2020-12-10 DIAGNOSIS — M7672 Peroneal tendinitis, left leg: Secondary | ICD-10-CM

## 2020-12-10 MED ORDER — TRIAMCINOLONE ACETONIDE 10 MG/ML IJ SUSP
10.0000 mg | Freq: Once | INTRAMUSCULAR | Status: AC
  Administered 2020-12-10: 10 mg

## 2020-12-10 MED ORDER — DICLOFENAC SODIUM 75 MG PO TBEC: 75.0000 mg | DELAYED_RELEASE_TABLET | Freq: Two times a day (BID) | ORAL | 2 refills | Status: DC

## 2020-12-13 NOTE — Progress Notes (Signed)
Subjective:   Patient ID: [name] [name], male   DOB: 55 y.o.   MRN: [id number]   HPI Patient is developed a lot of discomfort in the left ankle and states its been sore and he does not remember specific injury and its been going on for months.  Has had previous heel problems which have resolved and that was a number of years ago and does not smoke likes to be active   Review of Systems  All other systems reviewed and are negative.      Objective:  Physical Exam Vitals and nursing note reviewed.  Constitutional:      Appearance: He is well-developed.  Pulmonary:     Effort: Pulmonary effort is normal.  Musculoskeletal:        General: Normal range of motion.  Skin:    General: Skin is warm.  Neurological:     Mental Status: He is alert.    Neurovascular status intact muscle strength was found to be adequate range of motion adequate.  Patient is found to have exquisite discomfort sinus tarsi left with inflammation fluid of the joint surface and also has pain to the outside of the foot with inflammation fluid base of fifth metatarsal at insertion tendon.  Good digital perfusion well oriented x3     Assessment:  In laboratory condition left lateral foot and into the peroneal tendon base of fifth metatarsal     Plan:  H&P x-rays reviewed condition discussed and today I did sterile prep and injected the peroneal insertion 3 mg Kenalog 5 mg Xylocaine advised on ice therapy anti-inflammatories and support and dispensed a brace to hold up the outside of the foot.  Encouraged to call questions concerns may require immobilization or possible MRI in future  X-rays were negative for signs of fracture or bony pathology associated with condition

## 2020-12-21 ENCOUNTER — Other Ambulatory Visit: Payer: Self-pay | Admitting: Podiatry

## 2020-12-22 ENCOUNTER — Other Ambulatory Visit: Payer: Self-pay | Admitting: Podiatry

## 2020-12-22 DIAGNOSIS — M7672 Peroneal tendinitis, left leg: Secondary | ICD-10-CM

## 2021-01-07 ENCOUNTER — Other Ambulatory Visit: Payer: Self-pay

## 2021-01-07 ENCOUNTER — Ambulatory Visit: Payer: 59 | Admitting: Podiatry

## 2021-01-07 DIAGNOSIS — T148XXA Other injury of unspecified body region, initial encounter: Secondary | ICD-10-CM

## 2021-01-11 NOTE — Progress Notes (Signed)
Subjective:   Patient ID: [name] [name], male   DOB: 55 y.o.   MRN: [id number]   HPI Patient presents with a lot of pain in the outside of the left foot in the peroneal insertion and proximal and states at times feels like it falls out of position.  Patient states the pain is sporadic but very intense at different times and is affecting his ability to be active   ROS      Objective:  Physical Exam  Neurovascular status intact with patient found to have quite a bit of discomfort in the peroneal tendon more underneath the lateral malleolus and extending distal towards the base of the fifth metatarsal.  There is fusiform swelling along the tendon     Assessment:  Probability or possibility for a tear of the peroneal tendon left or possibly a dislocating injury of the peroneal tendon     Plan:  H&P I was not able to ascertain a dislocation of the tendon but I am concerned about tear and I am sending for MRI.  We will get the results of this and decide long-term what may need to be done with possibility for surgical intervention and I made him aware of today

## 2021-01-21 ENCOUNTER — Telehealth: Payer: Self-pay | Admitting: *Deleted

## 2021-01-21 NOTE — Telephone Encounter (Signed)
Called [facility]), giving authorization per Dr Regal-case#[id number],dates from 01/07/2021-Dec. 3rd 2022. The patient is scheduled for 01/25/21,MRI left ankle w/o contrast.ref # [id number]

## 2021-01-25 ENCOUNTER — Ambulatory Visit
Admission: RE | Admit: 2021-01-25 | Discharge: 2021-01-25 | Disposition: A | Discharge status: Home / Self Care | Payer: 59 | Source: Ambulatory Visit | Attending: Podiatry | Admitting: Podiatry

## 2021-01-25 ENCOUNTER — Other Ambulatory Visit: Payer: Self-pay

## 2021-01-25 DIAGNOSIS — T148XXA Other injury of unspecified body region, initial encounter: Secondary | ICD-10-CM

## 2021-02-16 ENCOUNTER — Encounter: Payer: Self-pay | Admitting: Nurse Practitioner

## 2021-02-16 ENCOUNTER — Ambulatory Visit: Payer: 59 | Admitting: Nurse Practitioner

## 2021-02-16 ENCOUNTER — Other Ambulatory Visit: Payer: Self-pay

## 2021-02-16 VITALS — BP 160/89 | HR 61 | Temp 98.3°F | Resp 20 | Ht 70.0 in | Wt 188.0 lb

## 2021-02-16 DIAGNOSIS — R109 Unspecified abdominal pain: Secondary | ICD-10-CM | POA: Insufficient documentation

## 2021-02-16 DIAGNOSIS — R10A0 Flank pain, unspecified side: Secondary | ICD-10-CM

## 2021-02-16 NOTE — Assessment & Plan Note (Signed)
Symptoms of flank pain not resolved in the past 2 days.  Completed urinalysis results negative for kidney stones or urinary tract infection.  Encourage patient to increase hydration, anti-inflammatory for pain.  Follow-up with worsening unresolved symptoms.  Patient verbalized understanding.

## 2021-02-16 NOTE — Progress Notes (Signed)
Acute Office Visit  Subjective:    Patient ID: [name] [name], male    DOB: 04-17-1965, 55 y.o.   MRN: [id number]  Chief Complaint  Patient presents with   Flank pain, frequent urination    Flank Pain This is a new problem. Episode onset: in the past 3 days. The problem is unchanged. Pain location: left flank. The pain does not radiate. The pain is moderate. The pain is The same all the time. The symptoms are aggravated by bending and twisting. Stiffness is present All day. Pertinent negatives include no abdominal pain, bladder incontinence, dysuria, fever, numbness, pelvic pain or tingling. He has tried nothing for the symptoms.    Past Medical History:  Diagnosis Date   Chronic pain    COVID-19 12/2018   Dental crown present    Diabetes mellitus without complication ([place])    Diverticulosis    Hypertension    no meds   Internal hemorrhoids     Past Surgical History:  Procedure Laterality Date   CARPAL TUNNEL RELEASE Right 03/06/2014   Procedure: RIGHT CARPAL TUNNEL RELEASE;  Surgeon: Daryll Brod, MD;  Location: [facility];  Service: Orthopedics;  Laterality: Right;   COLONOSCOPY     GANGLION CYST EXCISION  2009   rt wrist   GANGLION CYST EXCISION Left 01/17/2013   Procedure: EXCISION VOLAR MYXOID CYST LEFT WRIST;  Surgeon: Cammie Sickle., MD;  Location: [facility];  Service: Orthopedics;  Laterality: Left;   Right Bicep surgery  2008   Radial nerve and cyst removal-bicep rupture   Right shoulder surgery  2010   SHOULDER ARTHROSCOPY  98,09   right    Family History  Problem Relation Age of Onset   Heart disease Mother    Diabetes Mother    Stroke Mother    Kidney cancer Father    Colon cancer Neg Hx    Rectal cancer Neg Hx     Social History   Socioeconomic History   Marital status: Married    Spouse name: Not on file   Number of children: Not on file   Years of education: Not on file   Highest education level: Not on file   Occupational History   Not on file  Tobacco Use   Smoking status: Never   Smokeless tobacco: Never  Vaping Use   Vaping Use: Never used  Substance and Sexual Activity   Alcohol use: No   Drug use: No   Sexual activity: Not on file  Other Topics Concern   Not on file  Social History Narrative   Not on file   Social Determinants of Health   Financial Resource Strain: Not on file  Food Insecurity: Not on file  Transportation Needs: Not on file  Physical Activity: Not on file  Stress: Not on file  Social Connections: Not on file  Intimate Partner Violence: Not on file    Outpatient Medications Prior to Visit  Medication Sig Dispense Refill   acetaminophen (TYLENOL) 500 MG tablet Take 500 mg by mouth every 6 (six) hours as needed.     cholecalciferol (VITAMIN D) 1000 UNITS tablet Take 1,000 Units by mouth daily. 5000     fluticasone (FLONASE) 50 MCG/ACT nasal spray PLACE 1 SPRAY INTO BOTH NOSTRILS DAILY. 48 mL 3   glucose blood test strip Use to check BG daily 100 each 3   lisinopril-hydrochlorothiazide (ZESTORETIC) 20-25 MG tablet Take 1 tablet by mouth daily. 90 tablet 3  metFORMIN (GLUCOPHAGE) 500 MG tablet Take 1 tablet (500 mg total) by mouth 2 (two) times daily with a meal. 180 tablet 3   Multiple Vitamins-Minerals (MULTIVITAMIN WITH MINERALS) tablet Take 1 tablet by mouth daily.     ONETOUCH DELICA LANCETS 11X MISC Use to check BG daily 100 each 3   rosuvastatin (CRESTOR) 5 MG tablet TAKE 1 TABLET BY MOUTH ON MON, WED AND FRI EVENINGS 39 tablet 1   diclofenac (VOLTAREN) 75 MG EC tablet Take 1 tablet (75 mg total) by mouth 2 (two) times daily. (Patient not taking: Reported on 02/16/2021) 50 tablet 2   triamcinolone cream (KENALOG) 0.1 % Apply 1 application topically 2 (two) times daily. (Patient not taking: Reported on 02/16/2021) 80 g 2   No facility-administered medications prior to visit.    No Known Allergies  Review of Systems  Constitutional: Negative.   Negative for chills and fever.  Respiratory: Negative.    Gastrointestinal:  Negative for abdominal distention, abdominal pain and nausea.  Genitourinary:  Positive for flank pain and urgency. Negative for bladder incontinence, dysuria, pelvic pain, penile discharge and penile swelling.  Neurological:  Negative for tingling and numbness.  All other systems reviewed and are negative.     Objective:    Physical Exam Vitals and nursing note reviewed.  Constitutional:      Appearance: Normal appearance.  HENT:     Head: Normocephalic.     Right Ear: Ear canal and external ear normal.     Left Ear: Ear canal and external ear normal.     Nose: Nose normal.     Mouth/Throat:     Mouth: Mucous membranes are moist.  Eyes:     Conjunctiva/sclera: Conjunctivae normal.  Cardiovascular:     Pulses: Normal pulses.     Heart sounds: Normal heart sounds.  Pulmonary:     Effort: Pulmonary effort is normal.     Breath sounds: Normal breath sounds.  Abdominal:     General: Bowel sounds are normal.  Skin:    General: Skin is warm.     Findings: No rash.  Neurological:     Mental Status: He is alert and oriented to person, place, and time.  Psychiatric:        Behavior: Behavior normal.    BP (!) 160/89   Pulse 61   Temp 98.3 F (36.8 C) (Temporal)   Resp 20   Ht 5' 10"  (1.778 m)   Wt 188 lb (85.3 kg)   SpO2 98%   BMI 26.98 kg/m  Wt Readings from Last 3 Encounters:  02/16/21 188 lb (85.3 kg)  10/14/20 181 lb 6.4 oz (82.3 kg)  10/14/19 186 lb 4 oz (84.5 kg)    Health Maintenance Due  Topic Date Due   Pneumococcal Vaccine 56-64 Years old (1 - PCV) Never done   COLON CANCER SCREENING ANNUAL FOBT  09/16/2018   FOOT EXAM  01/04/2019   COVID-19 Vaccine (3 - Booster for Pfizer series) 09/20/2019   OPHTHALMOLOGY EXAM  02/11/2020   Zoster Vaccines- Shingrix (2 of 2) 12/09/2020    There are no preventive care reminders to display for this patient.   Lab Results  Component Value  Date   TSH 2.550 09/18/2018   Lab Results  Component Value Date   WBC 7.0 10/14/2020   HGB 14.5 10/14/2020   HCT 42.0 10/14/2020   MCV 87 10/14/2020   PLT 373 10/14/2020   Lab Results  Component Value Date   NA  138 10/14/2020   K 4.9 10/14/2020   CO2 25 10/14/2020   GLUCOSE 131 (H) 10/14/2020   BUN 13 10/14/2020   CREATININE 0.94 10/14/2020   BILITOT 0.5 10/14/2020   ALKPHOS 47 10/14/2020   AST 18 10/14/2020   ALT 25 10/14/2020   PROT 6.6 10/14/2020   ALBUMIN 4.5 10/14/2020   CALCIUM 9.7 10/14/2020   EGFR 96 10/14/2020   Lab Results  Component Value Date   CHOL 141 10/14/2020   Lab Results  Component Value Date   HDL 48 10/14/2020   Lab Results  Component Value Date   LDLCALC 81 10/14/2020   Lab Results  Component Value Date   TRIG 58 10/14/2020   Lab Results  Component Value Date   CHOLHDL 2.9 10/14/2020   Lab Results  Component Value Date   HGBA1C 6.8 10/14/2020       Assessment & Plan:   Problem List Items Addressed This Visit       Other   Flank pain - Primary    Symptoms of flank pain not resolved in the past 2 days.  Completed urinalysis results negative for kidney stones or urinary tract infection.  Encourage patient to increase hydration, anti-inflammatory for pain.  Follow-up with worsening unresolved symptoms.  Patient verbalized understanding.      Relevant Orders   Urinalysis, Complete     No orders of the defined types were placed in this encounter.    Ivy Lynn, NP

## 2021-02-16 NOTE — Patient Instructions (Signed)
Flank Pain, Adult ?Flank pain is pain in your side. The flank is the area on your side between your upper belly (abdomen) and your spine. The pain may occur over a short time (acute), or it may be long-term or come back often (chronic). It may be mild or very bad. Pain in this area can be caused by many different things. ?Follow these instructions at home: ? ?Drink enough fluid to keep your pee (urine) pale yellow. ?Rest as told by your doctor. ?Take over-the-counter and prescription medicines only as told by your doctor. ?Keep a journal to keep track of: ?What has caused your flank pain. ?What has made your flank pain feel better. ?Keep all follow-up visits. ?Contact a doctor if: ?Medicine does not help your pain. ?You have new symptoms. ?Your pain gets worse. ?Your symptoms last longer than 2-3 days. ?You have trouble peeing. ?You are peeing more often than normal. ?Get help right away if: ?You have trouble breathing. ?You are short of breath. ?Your belly hurts, or it is swollen or red. ?You feel like you may vomit (nauseous). ?You vomit. ?You feel faint, or you faint. ?You have blood in your pee. ?You have flank pain and a fever. ?These symptoms may be an emergency. Get help right away. Call your local emergency services (911 in the [place] ?Do not wait to see if the symptoms will go away. ?Do not drive yourself to the hospital. ?Summary ?Flank pain is pain in your side. The flank is the area of your side between your upper belly (abdomen) and your spine. ?Flank pain may occur over a short time (acute), or it may be long-term or come back often (chronic). It may be mild or very bad. ?Pain in this area can be caused by many different things. ?Contact your doctor if your symptoms get worse or last longer than 2-3 days. ?This information is not intended to replace advice given to you by your health care provider. Make sure you discuss any questions you have with your health care provider. ?Document Revised:  06/01/2020 Document Reviewed: 06/01/2020 ?Elsevier Patient Education ? 2022 Elsevier Inc. ? ?

## 2021-02-17 LAB — MICROSCOPIC EXAMINATION

## 2021-02-17 LAB — URINALYSIS, COMPLETE
Bilirubin, UA: NEGATIVE
Glucose, UA: NEGATIVE
Ketones, UA: NEGATIVE
Leukocytes,UA: NEGATIVE
Nitrite, UA: NEGATIVE
Protein,UA: NEGATIVE
RBC, UA: NEGATIVE
Specific Gravity, UA: 1.01 (ref 1.005–1.030)
Urobilinogen, Ur: 0.2 mg/dL (ref 0.2–1.0)
pH, UA: 6 (ref 5.0–7.5)

## 2021-02-24 ENCOUNTER — Other Ambulatory Visit: Payer: Self-pay | Admitting: Podiatry

## 2021-02-24 NOTE — Telephone Encounter (Signed)
Please advise 

## 2021-04-12 ENCOUNTER — Telehealth: Payer: Self-pay | Admitting: Podiatry

## 2021-04-12 NOTE — Telephone Encounter (Signed)
Patient calling to get results from MRI he had done in October.  Please advise

## 2021-04-12 NOTE — Telephone Encounter (Signed)
Findings were  normal. Reappoint if having pain

## 2021-04-15 ENCOUNTER — Encounter: Payer: Self-pay | Admitting: Podiatry

## 2021-04-15 ENCOUNTER — Other Ambulatory Visit: Payer: Self-pay

## 2021-04-15 ENCOUNTER — Ambulatory Visit: Payer: 59 | Admitting: Podiatry

## 2021-04-15 DIAGNOSIS — M7672 Peroneal tendinitis, left leg: Secondary | ICD-10-CM | POA: Diagnosis not present

## 2021-04-15 DIAGNOSIS — M25572 Pain in left ankle and joints of left foot: Secondary | ICD-10-CM | POA: Diagnosis not present

## 2021-04-16 NOTE — Progress Notes (Signed)
Subjective:   Patient ID: [name] [name], male   DOB: 56 y.o.   MRN: [id number]   HPI Patient is here to discuss the MRI results and states he still gets the sharp pain in the outside of his left ankle but it has been less frequent with the treatment that I have done and he never knows when it can happen but when it does it is intense but he can go periods with it not occurring   ROS      Objective:  Physical Exam  Neuro vascular status intact full range of motion accomplished I was unable to duplicate the pain he experiences and I did not feel the peroneal tendon subluxing at the current time with no edema noted in the lateral foot compartment     Assessment:  MRI that was done which will be reviewed along with the possibility for subluxation of the peroneal tendon     Plan:  H&P reviewed condition at great length discussed subluxation of the peroneal discussed that there is no indications of a tear of the peroneal tendon on MRI and at this point we discussed treatment options which would be reviewed grooving the peroneal sulcus to try to create a environment that no subluxation would occur but I spent a great deal time going over this may or may not solve his problem and with his symptoms being so sporadic that concerned about doing this.  Patient is good to hold off get a try to avoid the activity from happening and is encouraged to call us with questions concerns and if it gets worse for the consideration of surgery

## 2021-04-17 ENCOUNTER — Other Ambulatory Visit: Payer: Self-pay | Admitting: Family Medicine

## 2021-04-17 DIAGNOSIS — E7800 Pure hypercholesterolemia, unspecified: Secondary | ICD-10-CM

## 2021-04-20 ENCOUNTER — Other Ambulatory Visit: Payer: Self-pay

## 2021-04-20 ENCOUNTER — Encounter: Payer: Self-pay | Admitting: Family Medicine

## 2021-04-20 ENCOUNTER — Ambulatory Visit: Payer: 59 | Admitting: Family Medicine

## 2021-04-20 VITALS — BP 137/78 | HR 66 | Temp 97.5°F | Ht 70.0 in | Wt 184.0 lb

## 2021-04-20 DIAGNOSIS — I10 Essential (primary) hypertension: Secondary | ICD-10-CM

## 2021-04-20 DIAGNOSIS — M545 Low back pain, unspecified: Secondary | ICD-10-CM

## 2021-04-20 DIAGNOSIS — E785 Hyperlipidemia, unspecified: Secondary | ICD-10-CM

## 2021-04-20 DIAGNOSIS — E1169 Type 2 diabetes mellitus with other specified complication: Secondary | ICD-10-CM | POA: Diagnosis not present

## 2021-04-20 DIAGNOSIS — Z23 Encounter for immunization: Secondary | ICD-10-CM | POA: Diagnosis not present

## 2021-04-20 DIAGNOSIS — E119 Type 2 diabetes mellitus without complications: Secondary | ICD-10-CM

## 2021-04-20 LAB — BAYER DCA HB A1C WAIVED: HB A1C (BAYER DCA - WAIVED): 6.3 % — ABNORMAL HIGH (ref 4.8–5.6)

## 2021-04-20 MED ORDER — BLOOD GLUCOSE MONITOR KIT: PACK | 11 refills | Status: AC

## 2021-04-20 NOTE — Addendum Note (Signed)
Addended by: Baldomero Lamy B on: 04/20/2021 04:16 PM   Modules accepted: Orders

## 2021-04-20 NOTE — Progress Notes (Signed)
Subjective:  Patient ID: [name] [name],  male    DOB: 05-May-1965  Age: 56 y.o.    CC: Medical Management of Chronic Issues   HPI [name] [name] presents for  follow-up of hypertension. Patient has no history of headache chest pain or shortness of breath or recent cough. Patient also denies symptoms of TIA such as numbness weakness lateralizing. Patient denies side effects from medication. States taking it regularly.  Patient also  in for follow-up of elevated cholesterol. Doing well without complaints on current medication. Denies side effects  including myalgia and arthralgia and nausea. Also in today for liver function testing. Currently no chest pain, shortness of breath or other cardiovascular related symptoms noted.  Follow-up of diabetes. Patient does check blood sugar at home. No highs or lows Patient denies symptoms such as excessive hunger or urinary frequency, excessive hunger, nausea No significant hypoglycemic spells noted. Medications reviewed. Pt reports taking them regularly. Pt. denies complication/adverse reaction today.   Aching at left flank onset 2 months ago. Now getting worse not better. Saw urology. Did CT of kidney that was negative. Then went to Ortho. No relief with prednisone.  History [name] [name] has a past medical history of Chronic pain, COVID-19 (12/2018), Dental crown present, Diabetes mellitus without complication ([place]), Diverticulosis, Hypertension, and Internal hemorrhoids.   [name] [name] has a past surgical history that includes Right shoulder surgery (2010); Right Bicep surgery (2008); Ganglion cyst excision (2009); Shoulder arthroscopy (98,09); Colonoscopy; Ganglion cyst excision (Left, 01/17/2013); and Carpal tunnel release (Right, 03/06/2014).   His family history includes Diabetes in his mother; Heart disease in his mother; Kidney cancer in his father; Stroke in his mother.[name] [name] reports that [name] [name] has never smoked. [name] [name] has never used smokeless tobacco. [name] [name] reports that [name] [name] does  not drink alcohol and does not use drugs.  Current Outpatient Medications on File Prior to Visit  Medication Sig Dispense Refill   acetaminophen (TYLENOL) 500 MG tablet Take 500 mg by mouth every 6 (six) hours as needed.     cholecalciferol (VITAMIN D) 1000 UNITS tablet Take 1,000 Units by mouth daily. 5000     diclofenac (VOLTAREN) 75 MG EC tablet TAKE 1 TABLET BY MOUTH TWICE A DAY 50 tablet 2   fluticasone (FLONASE) 50 MCG/ACT nasal spray PLACE 1 SPRAY INTO BOTH NOSTRILS DAILY. 48 mL 3   glucose blood test strip Use to check BG daily 100 each 3   lisinopril-hydrochlorothiazide (ZESTORETIC) 20-25 MG tablet Take 1 tablet by mouth daily. 90 tablet 3   metFORMIN (GLUCOPHAGE) 500 MG tablet Take 1 tablet (500 mg total) by mouth 2 (two) times daily with a meal. 180 tablet 3   Multiple Vitamins-Minerals (MULTIVITAMIN WITH MINERALS) tablet Take 1 tablet by mouth daily.     ONETOUCH DELICA LANCETS 42H MISC Use to check BG daily 100 each 3   rosuvastatin (CRESTOR) 5 MG tablet TAKE 1 TABLET BY MOUTH ON MON, WED AND FRI EVENINGS 39 tablet 0   No current facility-administered medications on file prior to visit.    ROS Review of Systems  Constitutional:  Negative for chills, diaphoresis and fever.  HENT:  Negative for sore throat.   Respiratory:  Negative for shortness of breath.   Cardiovascular:  Negative for chest pain.  Gastrointestinal:  Negative for abdominal pain.  Musculoskeletal:  Positive for arthralgias, back pain, gait problem and myalgias. Negative for neck pain.  Skin:  Negative for rash.  Neurological:  Positive for weakness. Negative for numbness.   Objective:  BP 137/78    Pulse 66    Temp (!) 97.5 F (36.4 C)    Ht 5' 10"  (1.778 m)    Wt 184 lb (83.5 kg)    SpO2 99%    BMI 26.40 kg/m   BP Readings from Last 3 Encounters:  04/20/21 137/78  02/16/21 (!) 160/89  10/14/20 121/79    Wt Readings from Last 3 Encounters:  04/20/21 184 lb (83.5 kg)  02/16/21 188 lb (85.3 kg)   10/14/20 181 lb 6.4 oz (82.3 kg)     Physical Exam  Diabetic Foot Exam - Simple   No data filed       Assessment & Plan:   [name] [name] was seen today for medical management of chronic issues.  Diagnoses and all orders for this visit:  Type 2 diabetes mellitus without complication, without long-term current use of insulin (HCC) -     Bayer DCA Hb A1c Waived  Essential hypertension -     CBC with Differential/Platelet -     CMP14+EGFR  Dyslipidemia associated with type 2 diabetes mellitus ([facility]) -     Lipid panel  Lumbar back pain -     Ambulatory referral to Physical Therapy  Other orders -     blood glucose meter kit and supplies KIT; Use up to four times daily as directed. (FOR ICD-10 : E11.9   I have discontinued [name] [name]'s triamcinolone cream. I am also having him start on blood glucose meter kit and supplies. Additionally, I am having him maintain his multivitamin with minerals, cholecalciferol, acetaminophen, glucose blood, OneTouch Delica Lancets 52C, fluticasone, lisinopril-hydrochlorothiazide, metFORMIN, diclofenac, and rosuvastatin.  Meds ordered this encounter  Medications   blood glucose meter kit and supplies KIT    Sig: Use up to four times daily as directed. (FOR ICD-10 : E11.9    Dispense:  1 each    Refill:  11    Dispense according to pt preference and insurance requirement    Order Specific Question:   Number of strips    Answer:   100    Order Specific Question:   Number of lancets    Answer:   100     Follow-up: Return in about 3 months (around 07/19/2021).  Claretta Fraise, M.D.

## 2021-04-21 LAB — CBC WITH DIFFERENTIAL/PLATELET
Basophils Absolute: 0.1 10*3/uL (ref 0.0–0.2)
Basos: 1 %
EOS (ABSOLUTE): 0.3 10*3/uL (ref 0.0–0.4)
Eos: 4 %
Hematocrit: 46.1 % (ref 37.5–51.0)
Hemoglobin: 15.2 g/dL (ref 13.0–17.7)
Immature Grans (Abs): 0 10*3/uL (ref 0.0–0.1)
Immature Granulocytes: 0 %
Lymphocytes Absolute: 2.3 10*3/uL (ref 0.7–3.1)
Lymphs: 33 %
MCH: 29 pg (ref 26.6–33.0)
MCHC: 33 g/dL (ref 31.5–35.7)
MCV: 88 fL (ref 79–97)
Monocytes Absolute: 0.8 10*3/uL (ref 0.1–0.9)
Monocytes: 12 %
Neutrophils Absolute: 3.5 10*3/uL (ref 1.4–7.0)
Neutrophils: 50 %
Platelets: 396 10*3/uL (ref 150–450)
RBC: 5.24 x10E6/uL (ref 4.14–5.80)
RDW: 12.9 % (ref 11.6–15.4)
WBC: 7 10*3/uL (ref 3.4–10.8)

## 2021-04-21 LAB — CMP14+EGFR
ALT: 17 IU/L (ref 0–44)
AST: 15 IU/L (ref 0–40)
Albumin/Globulin Ratio: 2.5 — ABNORMAL HIGH (ref 1.2–2.2)
Albumin: 4.7 g/dL (ref 3.8–4.9)
Alkaline Phosphatase: 42 IU/L — ABNORMAL LOW (ref 44–121)
BUN/Creatinine Ratio: 18 (ref 9–20)
BUN: 17 mg/dL (ref 6–24)
Bilirubin Total: 0.5 mg/dL (ref 0.0–1.2)
CO2: 27 mmol/L (ref 20–29)
Calcium: 9.9 mg/dL (ref 8.7–10.2)
Chloride: 99 mmol/L (ref 96–106)
Creatinine, Ser: 0.96 mg/dL (ref 0.76–1.27)
Globulin, Total: 1.9 g/dL (ref 1.5–4.5)
Glucose: 124 mg/dL — ABNORMAL HIGH (ref 70–99)
Potassium: 4.7 mmol/L (ref 3.5–5.2)
Sodium: 140 mmol/L (ref 134–144)
Total Protein: 6.6 g/dL (ref 6.0–8.5)
eGFR: 93 mL/min/{1.73_m2} (ref >59)

## 2021-04-21 LAB — LIPID PANEL
Chol/HDL Ratio: 2.8 ratio (ref 0.0–5.0)
Cholesterol, Total: 138 mg/dL (ref 100–199)
HDL: 49 mg/dL (ref >39)
LDL Chol Calc (NIH): 74 mg/dL (ref 0–99)
Triglycerides: 78 mg/dL (ref 0–149)
VLDL Cholesterol Cal: 15 mg/dL (ref 5–40)

## 2021-04-21 NOTE — Progress Notes (Signed)
Hello Johnthan,  Your lab result is normal and/or stable.Some minor variations that are not significant are commonly marked abnormal, but do not represent any medical problem for you.  Best regards, Glanda Spanbauer, M.D.

## 2021-05-04 ENCOUNTER — Other Ambulatory Visit: Payer: Self-pay

## 2021-05-04 ENCOUNTER — Ambulatory Visit: Payer: 59 | Attending: Family Medicine

## 2021-05-04 DIAGNOSIS — M545 Low back pain, unspecified: Secondary | ICD-10-CM | POA: Insufficient documentation

## 2021-05-04 NOTE — Therapy (Signed)
[facility] [place], [place], [address] Phone: [phone]   Fax:  [phone]  Physical Therapy Evaluation  Patient Details  Name: [name] [name] MRN: [id number] Date of Birth: 1965/06/17 Referring Provider (PT): Livia Snellen, MD   Encounter Date: 05/04/2021   PT End of Session - 05/04/21 1523     Visit Number 1    Number of Visits 10    Date for PT Re-Evaluation 07/09/21    PT Start Time 7341    PT Stop Time 1604    PT Time Calculation (min) 41 min    Activity Tolerance Patient tolerated treatment well    Behavior During Therapy [facility] for tasks assessed/performed             Past Medical History:  Diagnosis Date   Chronic pain    COVID-19 12/2018   Dental crown present    Diabetes mellitus without complication ([place])    Diverticulosis    Hypertension    no meds   Internal hemorrhoids     Past Surgical History:  Procedure Laterality Date   CARPAL TUNNEL RELEASE Right 03/06/2014   Procedure: RIGHT CARPAL TUNNEL RELEASE;  Surgeon: Daryll Brod, MD;  Location: [facility];  Service: Orthopedics;  Laterality: Right;   COLONOSCOPY     GANGLION CYST EXCISION  2009   rt wrist   GANGLION CYST EXCISION Left 01/17/2013   Procedure: EXCISION VOLAR MYXOID CYST LEFT WRIST;  Surgeon: Cammie Sickle., MD;  Location: [facility];  Service: Orthopedics;  Laterality: Left;   Right Bicep surgery  2008   Radial nerve and cyst removal-bicep rupture   Right shoulder surgery  2010   SHOULDER ARTHROSCOPY  98,09   right    There were no vitals filed for this visit.    Subjective Assessment - 05/04/21 1524     Subjective Patient reports that his back started hurting about 4 months ago with no known cause. He notes that it felt like kidney stones initially, but this was ruled out with a blood test. He then had a CT scan which showed everything was normal. His pain then began to progress from his upper  lumbar spine to his lower lumbar spine. He had tried multiple medications and heating pad. He is currently scheduled to see Dr. Nelva Bush on 05/11/21. He notes that his pain is not so bad today, but Sunday was a bad day.    Pertinent History HTN, diabetes, multiple shoulder surgeries    Limitations Walking;House hold activities    Patient Stated Goals reduced pain, playing with his dog, clean his car    Currently in Pain? Yes    Pain Score 9     Pain Location Back    Pain Orientation Left;Lower    Pain Descriptors / Indicators Aching;Sharp    Pain Type Acute pain    Pain Onset More than a month ago    Pain Frequency Constant    Aggravating Factors  lumbar flexion, sitting up straight,    Pain Relieving Factors inversion table ("hit or miss"), sitting (reclined), standing    Effect of Pain on Daily Activities he has to modify how he does activities to avoid using his back                [facility] PT Assessment - 05/04/21 0001       Assessment   Medical Diagnosis Lumbar back pain    Referring Provider (PT) Livia Snellen, MD  Onset Date/Surgical Date --   4 months ago   Next MD Visit 07/21/21    Prior Therapy No      Precautions   Precautions None      Restrictions   Weight Bearing Restrictions No      Balance Screen   Has the patient fallen in the past 6 months No    Has the patient had a decrease in activity level because of a fear of falling?  No    Is the patient reluctant to leave their home because of a fear of falling?  No      Home Environment   Living Environment Private residence    Type of [place] to enter    [employer] or work area in basement;Multi-level      Prior Function   Level of Independence Independent    Vocation Full time employment    Leisure "car guy", yardwork      Cognition   Overall Cognitive Status Within Functional Limits for tasks assessed    Attention Focused    Focused Attention Appears intact    Memory Appears  intact    Awareness Appears intact    Problem Solving Appears intact      Sensation   Additional Comments Patient reports no numbness or tingling      ROM / Strength   AROM / PROM / Strength AROM;Strength      AROM   AROM Assessment Site Lumbar    Lumbar Flexion 70   pain increases up to 50 degrees and then decreases at 60 degrees   Lumbar Extension 20    Lumbar - Right Side Bend WFL and nonpainful    Lumbar - Left Side Bend WFL   slight low back pain   Lumbar - Right Rotation WFL   nonpainful   Lumbar - Left Rotation 25% limited   familiar low back pain     Strength   Strength Assessment Site Hip;Knee    Right/Left Hip Right;Left    Right Hip Flexion 5/5    Left Hip Flexion 5/5    Right/Left Knee Left;Right    Right Knee Flexion 5/5    Right Knee Extension 5/5    Left Knee Flexion 5/5    Left Knee Extension 5/5      Palpation   Spinal mobility Lumbar spine: WFL and nonpainful    Palpation comment TTP: left QL                        Objective measurements completed on examination: See above findings.       [facility] Adult PT Treatment/Exercise - 05/04/21 0001       Exercises   Exercises Lumbar      Lumbar Exercises: Stretches   Lower Trunk Rotation 10 seconds;5 reps                          PT Long Term Goals - 05/04/21 1626       PT LONG TERM GOAL #1   Title Patient will be independent with his HEP.    Time 5    Period Weeks    Status New    Target Date 06/08/21      PT LONG TERM GOAL #2   Title Patient will be able to demonstrate left lumbar rotation without being limited by his familiar left sided low back  pain.    Time 5    Period Weeks    Status New    Target Date 06/08/21      PT LONG TERM GOAL #3   Title Patient will be able to lift at least 8 pounds from the floor without being limited by his familiar low back pain for improved function with his daily activities.    Time 5    Period Weeks    Status New     Target Date 06/08/21                    Plan - 05/04/21 1614     Clinical Impression Statement Patient is a 56 year old male presenting to physical therapy with left sided low back pain with no known cause. He presented today with low pain irritability. However, his pain was most significantly reproduced with lumbar AROM. Palpation to the left QL was also able to slightly reproduce his familiar symptoms. Recommend that he continue with skilled physical therapy to address his remaining impairments to return to his prior level of function.    Personal Factors and Comorbidities Time since onset of injury/illness/exacerbation;Comorbidity 1;Comorbidity 2    Comorbidities HTN, diabetes    Examination-Activity Limitations Sit;Carry;Squat;Lift;Bend    Examination-Participation Restrictions Occupation;Community Activity;Other    Stability/Clinical Decision Making Evolving/Moderate complexity    Clinical Decision Making Moderate    Rehab Potential Good    PT Frequency 2x / week    PT Duration Other (comment)   5 weeks   PT Treatment/Interventions Electrical Stimulation;Iontophoresis 4mg /ml Dexamethasone;Moist Heat;Cryotherapy;Traction;Ultrasound;Neuromuscular re-education;Therapeutic exercise;Therapeutic activities;Functional mobility training;Patient/family education;Manual techniques;Dry needling;Taping;Spinal Manipulations    PT Next Visit Plan recumbent bike, core stability progression, modalities as needed    Consulted and Agree with Plan of Care Patient             Patient will benefit from skilled therapeutic intervention in order to improve the following deficits and impairments:  Decreased range of motion, Impaired tone, Decreased activity tolerance, Pain, Decreased mobility  Visit Diagnosis: Acute left-sided low back pain without sciatica     Problem List Patient Active Problem List   Diagnosis Date Noted   Flank pain 02/16/2021   Pure hypercholesterolemia 09/18/2018    Right upper quadrant abdominal pain 09/11/2017   Type 2 diabetes mellitus ([place]) 07/01/2013   Dyslipidemia associated with type 2 diabetes mellitus ([place]) 07/01/2013   Vitamin D deficiency 06/03/2013   Essential hypertension    RECTAL BLEEDING 01/22/2008    Darlin Coco, PT 05/04/2021, 4:33 PM  [facility] [address] [place], [place], [address] Phone: [phone]   Fax:  [phone]  Name: [name] [name] MRN: [id number] Date of Birth: 08/21/1965

## 2021-05-07 ENCOUNTER — Other Ambulatory Visit: Payer: Self-pay

## 2021-05-07 ENCOUNTER — Ambulatory Visit: Payer: 59 | Attending: Family Medicine | Admitting: *Deleted

## 2021-05-07 DIAGNOSIS — M545 Low back pain, unspecified: Secondary | ICD-10-CM | POA: Diagnosis present

## 2021-05-07 NOTE — Therapy (Addendum)
[facility] [place], [place], [address] Phone: [phone]   Fax:  [phone]  Physical Therapy Treatment  Patient Details  Name: [name] [name] MRN: [id number] Date of Birth: Sep 08, 1965 Referring Provider (PT): Livia Snellen, MD   Encounter Date: 05/07/2021   PT End of Session - 05/07/21 1249     Visit Number 2    Number of Visits 10    Date for PT Re-Evaluation 07/09/21    PT Start Time 0945    PT Stop Time 5974    PT Time Calculation (min) 50 min             Past Medical History:  Diagnosis Date   Chronic pain    COVID-19 12/2018   Dental crown present    Diabetes mellitus without complication ([place])    Diverticulosis    Hypertension    no meds   Internal hemorrhoids     Past Surgical History:  Procedure Laterality Date   CARPAL TUNNEL RELEASE Right 03/06/2014   Procedure: RIGHT CARPAL TUNNEL RELEASE;  Surgeon: Daryll Brod, MD;  Location: [facility];  Service: Orthopedics;  Laterality: Right;   COLONOSCOPY     GANGLION CYST EXCISION  2009   rt wrist   GANGLION CYST EXCISION Left 01/17/2013   Procedure: EXCISION VOLAR MYXOID CYST LEFT WRIST;  Surgeon: Cammie Sickle., MD;  Location: [facility];  Service: Orthopedics;  Laterality: Left;   Right Bicep surgery  2008   Radial nerve and cyst removal-bicep rupture   Right shoulder surgery  2010   SHOULDER ARTHROSCOPY  98,09   right    There were no vitals filed for this visit.   Subjective Assessment - 05/07/21 0955     Subjective LBP comes and goes. 4-5/10. LT side    Pertinent History HTN, diabetes, multiple shoulder surgeries    Limitations Walking;House hold activities                               [facility] Adult PT Treatment/Exercise - 05/07/21 0001       Therapeutic Activites    Therapeutic Activities ADL's    ADL's Discussed AB bracing and   movement patterns discussed with pt for ADL's to decrease  pain triggers throughout the day      Exercises   Exercises Lumbar      Lumbar Exercises: Seated   Other Seated Lumbar Exercises SOC pelvic tilts x 10 for LB mobility/control      Manual Therapy   Manual Therapy Soft tissue mobilization    Soft tissue mobilization STW/ TPR to LT QL with Pt in RT sidelying                          PT Long Term Goals - 05/04/21 1626       PT LONG TERM GOAL #1   Title Patient will be independent with his HEP.    Time 5    Period Weeks    Status New    Target Date 06/08/21      PT LONG TERM GOAL #2   Title Patient will be able to demonstrate left lumbar rotation without being limited by his familiar left sided low back pain.    Time 5    Period Weeks    Status New    Target Date 06/08/21  PT LONG TERM GOAL #3   Title Patient will be able to lift at least 8 pounds from the floor without being limited by his familiar low back pain for improved function with his daily activities.    Time 5    Period Weeks    Status New    Target Date 06/08/21                   Plan - 05/07/21 1019     Clinical Impression Statement Pt arrived today doing about the same with LBPand reports having a consult with Dr Nelva Bush next week. Rx focused on Movement patterns and ways to decrease pain triggers with ADL's as well as SOC pelvic tilts for sitting mobilityand posture control. STW was also performed to LT QL with notable tenderness and TPs.    Personal Factors and Comorbidities Time since onset of injury/illness/exacerbation;Comorbidity 1;Comorbidity 2    Comorbidities HTN, diabetes    Examination-Activity Limitations Sit;Carry;Squat;Lift;Bend    Examination-Participation Restrictions Occupation;Community Activity;Other    Stability/Clinical Decision Making Evolving/Moderate complexity    Rehab Potential Good    PT Frequency 2x / week    PT Duration Other (comment)    PT Treatment/Interventions Electrical Stimulation;Iontophoresis  81m/ml Dexamethasone;Moist Heat;Cryotherapy;Traction;Ultrasound;Neuromuscular re-education;Therapeutic exercise;Therapeutic activities;Functional mobility training;Patient/family education;Manual techniques;Dry needling;Taping;Spinal Manipulations    PT Next Visit Plan recumbent bike, core stability progression, modalities as needed    Consulted and Agree with Plan of Care Patient             Patient will benefit from skilled therapeutic intervention in order to improve the following deficits and impairments:  Decreased range of motion, Impaired tone, Decreased activity tolerance, Pain, Decreased mobility  Visit Diagnosis: Acute left-sided low back pain without sciatica     Problem List Patient Active Problem List   Diagnosis Date Noted   Flank pain 02/16/2021   Pure hypercholesterolemia 09/18/2018   Right upper quadrant abdominal pain 09/11/2017   Type 2 diabetes mellitus ([place] 07/01/2013   Dyslipidemia [place] with type 2 diabetes mellitus ([place] 07/01/2013   Vitamin D deficiency 06/03/2013   Essential hypertension    RECTAL BLEEDING 01/22/2008    Rory Montel,CHRIS, PTA 05/07/2021, 1:03 PM  [facility] [address] [address]Phone: [phone]  Fax:  [phone] Name: [name] DIOPMRN: [id number] of Birth: 6May 11, 1967 PHYSICAL THERAPY DISCHARGE SUMMARY  Visits from Start of Care: 2  Current functional level related to goals / functional outcomes: Patient is being discharged at this time as he did not return after his second visit.    Remaining deficits: See evaluation    Education / Equipment: HEP   Patient agrees to discharge. Patient goals were not met. Patient is being discharged due to not returning since the last visit.  JJacqulynn Cadet PT, DPT

## 2021-05-12 ENCOUNTER — Other Ambulatory Visit: Payer: Self-pay | Admitting: Podiatry

## 2021-05-12 NOTE — Telephone Encounter (Signed)
Please advise 

## 2021-05-13 ENCOUNTER — Ambulatory Visit: Payer: 59 | Admitting: *Deleted

## 2021-07-16 ENCOUNTER — Other Ambulatory Visit: Payer: Self-pay | Admitting: Family Medicine

## 2021-07-16 DIAGNOSIS — E7800 Pure hypercholesterolemia, unspecified: Secondary | ICD-10-CM

## 2021-07-21 ENCOUNTER — Ambulatory Visit: Payer: 59 | Admitting: Family Medicine

## 2021-08-12 ENCOUNTER — Other Ambulatory Visit: Payer: Self-pay | Admitting: Podiatry

## 2021-08-12 NOTE — Telephone Encounter (Signed)
Please advise 

## 2021-08-18 ENCOUNTER — Encounter: Payer: Self-pay | Admitting: Family Medicine

## 2021-08-18 ENCOUNTER — Ambulatory Visit: Payer: 59 | Admitting: Family Medicine

## 2021-08-18 VITALS — BP 106/61 | HR 68 | Temp 97.7°F | Ht 70.0 in | Wt 181.6 lb

## 2021-08-18 DIAGNOSIS — E119 Type 2 diabetes mellitus without complications: Secondary | ICD-10-CM | POA: Diagnosis not present

## 2021-08-18 DIAGNOSIS — N4830 Priapism, unspecified: Secondary | ICD-10-CM | POA: Diagnosis not present

## 2021-08-18 DIAGNOSIS — I10 Essential (primary) hypertension: Secondary | ICD-10-CM

## 2021-08-18 DIAGNOSIS — E1169 Type 2 diabetes mellitus with other specified complication: Secondary | ICD-10-CM

## 2021-08-18 DIAGNOSIS — E785 Hyperlipidemia, unspecified: Secondary | ICD-10-CM

## 2021-08-18 LAB — BAYER DCA HB A1C WAIVED: HB A1C (BAYER DCA - WAIVED): 6.1 % — ABNORMAL HIGH (ref 4.8–5.6)

## 2021-08-18 MED ORDER — ACCU-CHEK AVIVA PLUS VI STRP: ORAL_STRIP | 12 refills | Status: DC

## 2021-08-18 MED ORDER — ACCU-CHEK AVIVA PLUS W/DEVICE KIT: PACK | 0 refills | Status: DC

## 2021-08-18 NOTE — Progress Notes (Signed)
? ?Subjective:  ?Patient ID: [name] [name], male    DOB: 08-14-1965  Age: 56 y.o. MRN: [id number] ? ?CC: Medical Management of Chronic Issues ? ? ?HPI ?[name] presents forFollow-up of diabetes. Patient not checking blood sugar at home.  ?Has no meter. His broke.  ?Patient denies symptoms such as polyuria, polydipsia, excessive hunger, nausea ?No significant hypoglycemic spells noted. ?Medications reviewed. Pt reports taking them regularly without complication/adverse reaction being reported today.  ? ? presents for  follow-up of hypertension. Patient has no history of headache chest pain or shortness of breath or recent cough. Patient also denies symptoms of TIA such as focal numbness or weakness. Patient denies side effects from medication. States taking it regularly. ? ? in for follow-up of elevated cholesterol. Doing well without complaints on current medication. Still feels mild myalgia and arthralgia the day after he takes the crestor. Taking 2-3 times a week. Currently no chest pain, shortness of breath or other cardiovascular related symptoms noted. ? ?He is concerned about painful erections.  He is okay when the erection is normally upright.  However he is having rather hard erections during his sleep and when he rolls over it twists his penis and wakes him up with pain.  He would like to have a referral.  He is seen a doctor and [facility] that did not offer any assistance.  Therefore he wants to see urology in [place]. ? ? ?History ?Egan has a past medical history of Chronic pain, COVID-19 (12/2018), Dental crown present, Diabetes mellitus without complication ([place]), Diverticulosis, Hypertension, and Internal hemorrhoids.  ? ?He has a past surgical history that includes Right shoulder surgery (2010); Right Bicep surgery (2008); Ganglion cyst excision (2009); Shoulder arthroscopy (98,09); Colonoscopy; Ganglion cyst excision (Left, 01/17/2013); and Carpal tunnel release (Right, 03/06/2014).  ? ?His  family history includes Diabetes in his mother; Heart disease in his mother; Kidney cancer in his father; Stroke in his mother.He reports that he has never smoked. He has never used smokeless tobacco. He reports that he does not drink alcohol and does not use drugs. ? ?Current Outpatient Medications on File Prior to Visit  ?Medication Sig Dispense Refill  ? acetaminophen (TYLENOL) 500 MG tablet Take 500 mg by mouth every 6 (six) hours as needed.    ? blood glucose meter kit and supplies KIT Use up to four times daily as directed. (FOR ICD-10 : E11.9 1 each 11  ? cholecalciferol (VITAMIN D) 1000 UNITS tablet Take 1,000 Units by mouth daily. 5000    ? diclofenac (VOLTAREN) 75 MG EC tablet TAKE 1 TABLET BY MOUTH TWICE A DAY 50 tablet 2  ? fluticasone (FLONASE) 50 MCG/ACT nasal spray PLACE 1 SPRAY INTO BOTH NOSTRILS DAILY. 48 mL 3  ? lisinopril-hydrochlorothiazide (ZESTORETIC) 20-25 MG tablet Take 1 tablet by mouth daily. 90 tablet 3  ? metFORMIN (GLUCOPHAGE) 500 MG tablet Take 1 tablet (500 mg total) by mouth 2 (two) times daily with a meal. 180 tablet 3  ? Multiple Vitamins-Minerals (MULTIVITAMIN WITH MINERALS) tablet Take 1 tablet by mouth daily.    ? ONETOUCH DELICA LANCETS 69C MISC Use to check BG daily 100 each 3  ? rosuvastatin (CRESTOR) 5 MG tablet TAKE 1 TABLET BY MOUTH ON MON, WED AND FRI EVENINGS 39 tablet 0  ? ?No current facility-administered medications on file prior to visit.  ? ? ?ROS ?Review of Systems  ?Constitutional:  Negative for fever.  ?HENT:  Positive for ear pain (both  are itching).   ?  Respiratory:  Negative for shortness of breath.   ?Cardiovascular:  Negative for chest pain.  ?Musculoskeletal:  Negative for arthralgias.  ?Skin:  Negative for rash.  ? ?Objective:  ?BP 106/61   Pulse 68   Temp 97.7 ?F (36.5 ?C)   Ht 5' 10"  (1.778 m)   Wt 181 lb 9.6 oz (82.4 kg)   SpO2 95%   BMI 26.06 kg/m?  ? ?BP Readings from Last 3 Encounters:  ?08/18/21 106/61  ?04/20/21 137/78  ?02/16/21 (!) 160/89   ? ? ?Wt Readings from Last 3 Encounters:  ?08/18/21 181 lb 9.6 oz (82.4 kg)  ?04/20/21 184 lb (83.5 kg)  ?02/16/21 188 lb (85.3 kg)  ? ? ? ?Physical Exam ?Vitals reviewed.  ?Constitutional:   ?   Appearance: He is well-developed.  ?HENT:  ?   Head: Normocephalic and atraumatic.  ?   Right Ear: External ear normal.  ?   Left Ear: External ear normal.  ?   Mouth/Throat:  ?   Pharynx: No oropharyngeal exudate or posterior oropharyngeal erythema.  ?Eyes:  ?   Pupils: Pupils are equal, round, and reactive to light.  ?Cardiovascular:  ?   Rate and Rhythm: Normal rate and regular rhythm.  ?   Heart sounds: No murmur heard. ?Pulmonary:  ?   Effort: No respiratory distress.  ?   Breath sounds: Normal breath sounds.  ?Musculoskeletal:  ?   Cervical back: Normal range of motion and neck supple.  ?Neurological:  ?   Mental Status: He is alert and oriented to person, place, and time.  ? ? ? ? ?Assessment & Plan:  ? ?[place] was seen today for medical management of chronic issues. ? ?Diagnoses and all orders for this visit: ? ?Essential hypertension ?-     CMP14+EGFR ?-     CBC with Differential/Platelet ? ?Type 2 diabetes mellitus without complication, without long-term current use of insulin ([place]) ?-     Bayer DCA Hb A1c Waived ? ?Dyslipidemia associated with type 2 diabetes mellitus ([place]) ?-     Lipid panel ? ?Painful penile erection ?-     Ambulatory referral to Urology ? ?Other orders ?-     Discontinue: Blood Glucose Monitoring Suppl (ACCU-CHEK AVIVA PLUS) w/Device KIT; Use to check glucose before breakfast and 2 hours after supper ?-     glucose blood (ACCU-CHEK AVIVA PLUS) test strip; Use use to check glucose before breakfast and 2 hours after supper ?-     Blood Glucose Monitoring Suppl (ACCU-CHEK AVIVA PLUS) w/Device KIT; Use to check glucose before breakfast and 2 hours after supper ? ? ? ? ? ?I have discontinued Lynette D. Buzzell's glucose blood. I am also having him start on Accu-Chek Aviva Plus. Additionally, I am having  him maintain his multivitamin with minerals, cholecalciferol, acetaminophen, OneTouch Delica Lancets 77A, fluticasone, lisinopril-hydrochlorothiazide, metFORMIN, blood glucose meter kit and supplies, rosuvastatin, diclofenac, and Accu-Chek Aviva Plus. ? ?Meds ordered this encounter  ?Medications  ? DISCONTD: Blood Glucose Monitoring Suppl (ACCU-CHEK AVIVA PLUS) w/Device KIT  ?  Sig: Use to check glucose before breakfast and 2 hours after supper  ?  Dispense:  1 kit  ?  Refill:  0  ? glucose blood (ACCU-CHEK AVIVA PLUS) test strip  ?  Sig: Use use to check glucose before breakfast and 2 hours after supper  ?  Dispense:  100 each  ?  Refill:  12  ? Blood Glucose Monitoring Suppl (ACCU-CHEK AVIVA PLUS) w/Device KIT  ?  Sig: Use to check glucose before breakfast and 2 hours after supper  ?  Dispense:  1 kit  ?  Refill:  0  ? ? ? ?Follow-up: Return in about 3 months (around 11/18/2021). ? ?Claretta Fraise, M.D. ?

## 2021-08-19 LAB — LIPID PANEL
Chol/HDL Ratio: 2.7 ratio (ref 0.0–5.0)
Cholesterol, Total: 126 mg/dL (ref 100–199)
HDL: 47 mg/dL (ref >39)
LDL Chol Calc (NIH): 68 mg/dL (ref 0–99)
Triglycerides: 48 mg/dL (ref 0–149)
VLDL Cholesterol Cal: 11 mg/dL (ref 5–40)

## 2021-08-19 LAB — CBC WITH DIFFERENTIAL/PLATELET
Basophils Absolute: 0.1 10*3/uL (ref 0.0–0.2)
Basos: 1 %
EOS (ABSOLUTE): 0.7 10*3/uL — ABNORMAL HIGH (ref 0.0–0.4)
Eos: 13 %
Hematocrit: 44.2 % (ref 37.5–51.0)
Hemoglobin: 15.1 g/dL (ref 13.0–17.7)
Immature Grans (Abs): 0 10*3/uL (ref 0.0–0.1)
Immature Granulocytes: 0 %
Lymphocytes Absolute: 1.8 10*3/uL (ref 0.7–3.1)
Lymphs: 35 %
MCH: 29.7 pg (ref 26.6–33.0)
MCHC: 34.2 g/dL (ref 31.5–35.7)
MCV: 87 fL (ref 79–97)
Monocytes Absolute: 0.6 10*3/uL (ref 0.1–0.9)
Monocytes: 12 %
Neutrophils Absolute: 2.1 10*3/uL (ref 1.4–7.0)
Neutrophils: 39 %
Platelets: 372 10*3/uL (ref 150–450)
RBC: 5.08 x10E6/uL (ref 4.14–5.80)
RDW: 12.6 % (ref 11.6–15.4)
WBC: 5.3 10*3/uL (ref 3.4–10.8)

## 2021-08-19 LAB — CMP14+EGFR
ALT: 18 IU/L (ref 0–44)
AST: 19 IU/L (ref 0–40)
Albumin/Globulin Ratio: 2 (ref 1.2–2.2)
Albumin: 4.6 g/dL (ref 3.8–4.9)
Alkaline Phosphatase: 45 IU/L (ref 44–121)
BUN/Creatinine Ratio: 18 (ref 9–20)
BUN: 17 mg/dL (ref 6–24)
Bilirubin Total: 0.6 mg/dL (ref 0.0–1.2)
CO2: 23 mmol/L (ref 20–29)
Calcium: 10 mg/dL (ref 8.7–10.2)
Chloride: 100 mmol/L (ref 96–106)
Creatinine, Ser: 0.95 mg/dL (ref 0.76–1.27)
Globulin, Total: 2.3 g/dL (ref 1.5–4.5)
Glucose: 125 mg/dL — ABNORMAL HIGH (ref 70–99)
Potassium: 4.3 mmol/L (ref 3.5–5.2)
Sodium: 138 mmol/L (ref 134–144)
Total Protein: 6.9 g/dL (ref 6.0–8.5)
eGFR: 95 mL/min/{1.73_m2} (ref >59)

## 2021-08-22 NOTE — Progress Notes (Signed)
Hello [name] [name],  Your lab result is normal and/or stable.Some minor variations that are not significant are commonly marked abnormal, but do not represent any medical problem for you.  Best regards, Joal Eakle, M.D.

## 2021-10-01 ENCOUNTER — Other Ambulatory Visit: Payer: Self-pay | Admitting: Family Medicine

## 2021-10-06 ENCOUNTER — Other Ambulatory Visit: Payer: Self-pay | Admitting: Family Medicine

## 2021-10-12 ENCOUNTER — Other Ambulatory Visit: Payer: Self-pay | Admitting: Family Medicine

## 2021-10-12 DIAGNOSIS — E7800 Pure hypercholesterolemia, unspecified: Secondary | ICD-10-CM

## 2021-10-16 ENCOUNTER — Other Ambulatory Visit: Payer: Self-pay | Admitting: Family Medicine

## 2021-10-16 DIAGNOSIS — E119 Type 2 diabetes mellitus without complications: Secondary | ICD-10-CM

## 2021-10-16 DIAGNOSIS — I10 Essential (primary) hypertension: Secondary | ICD-10-CM

## 2021-10-20 ENCOUNTER — Ambulatory Visit: Payer: 59 | Admitting: Family Medicine

## 2021-10-20 ENCOUNTER — Encounter: Payer: Self-pay | Admitting: Family Medicine

## 2021-10-20 VITALS — BP 157/95 | HR 55 | Temp 97.2°F | Ht 70.0 in | Wt 186.8 lb

## 2021-10-20 DIAGNOSIS — W57XXXA Bitten or stung by nonvenomous insect and other nonvenomous arthropods, initial encounter: Secondary | ICD-10-CM

## 2021-10-20 DIAGNOSIS — S20462A Insect bite (nonvenomous) of left back wall of thorax, initial encounter: Secondary | ICD-10-CM | POA: Diagnosis not present

## 2021-10-20 MED ORDER — DOXYCYCLINE HYCLATE 100 MG PO TABS: 200.0000 mg | ORAL_TABLET | Freq: Once | ORAL | 0 refills | Status: AC

## 2021-10-20 NOTE — Patient Instructions (Signed)
Doesn't look like Lymes.  STOP neosporin OK to use Cortaid for itching OK to use Benadryl topically for itching Monitor for Lyme disease symptoms and call office if you develop them.  The prophylactic dose of Doxycyline SHOULD prevent Lyme.  Lyme Disease Lyme disease is an infection that can affect many parts of the body, including the skin, joints, and nervous system. It is a bacterial infection that starts from the bite of an infected tick. Over time, the infection can worsen, and some of the symptoms are similar to the flu. If Lyme disease is not treated, it may cause joint pain, swelling, numbness, problems thinking, fatigue, muscle weakness, and other problems. What are the causes? This condition is caused by bacteria called Borrelia burgdorferi. You can get Lyme disease by being bitten by an infected tick. Only black-legged, or Ixodes, ticks that are infected with the bacteria can cause Lyme disease. The tick must be attached to your skin for a certain period of time to pass along the infection. This is usually 36-48 hours. Deer often carry infected ticks. What increases the risk? The following factors may make you more likely to develop this condition: Living in or visiting these areas in the [place]. The [place] states. The [place]. Spending time in wooded or grassy areas. Being outdoors with exposed skin. Camping, gardening, hiking, fishing, hunting, or working outdoors. Failing to remove a tick from your skin. What are the signs or symptoms? Symptoms of this condition may include: Chills and fever. Headache. Fatigue. General achiness. Muscle pain. Joint pain, often in the knees. A round, red rash that surrounds the center of the tick bite. The center of the rash may be blood colored or have tiny blisters. Swollen lymph glands. Stiff neck. How is this diagnosed? This condition is diagnosed based on: Your symptoms and medical history. A physical  exam. A blood test. How is this treated? The main treatment for this condition is antibiotic medicine, which is usually taken by mouth (orally). The length of treatment depends on how soon after a tick bite you begin taking the medicine. In some cases, treatment is necessary for several weeks. If the infection is severe, antibiotics may need to be given through an IV that is inserted into one of your veins. Follow these instructions at home: Take over-the-counter and prescription medicines only as told by your health care provider. Finish all antibiotic medicine, even when you start to feel better. Ask your health care provider about taking a probiotic in between doses of your antibiotic to help avoid an upset stomach or diarrhea. Check with your health care provider before supplementing your treatment. Many alternative therapies have not been proven and may be harmful to you. Keep all follow-up visits as told by your health care provider. This is important. How is this prevented? You can become reinfected if you get another tick bite from an infected tick. Take these steps to help prevent an infection: Cover your skin with light-colored clothing when you are outdoors in the spring and summer months. Spray clothing and skin with bug spray. The spray should be 20-30% DEET. You can also treat clothing with permethrin, and let it dry before you wear it. Do not apply permethrin directly to your skin. Permethrin can also be used to treat camping gear and boots. Always read and follow the instructions that come with a bug spray or insecticide. Avoid wooded, grassy, and shaded areas. Remove yard litter, brush, trash, and plants that attract deer  and rodents. Check yourself for ticks when you come indoors. Wash clothing worn each day. Shower after spending time outdoors. Check your pets for ticks before they come inside. If you find a tick attached to your skin: Remove it with tweezers. Clean your  hands and the bite area with rubbing alcohol or soap and water. Dispose of the tick by putting it in rubbing alcohol, putting it in a sealed bag or container, or flushing it down the toilet. You may choose to save the tick in a sealed container if you wish for it to be tested at a later time. Pregnant women should take special care to avoid tick bites because it is possible that the infection may be passed along to the fetus. Contact a health care provider if: You have symptoms after treatment. You have removed a tick and want to bring it to your health care provider for testing. Get help right away if: You have an irregular heartbeat. You have chest pain. You have nerve pain. Your face feels numb. You develop the following: A stiff neck. A severe headache. Severe nausea and vomiting. Sensitivity to light. Summary Lyme disease is an infection that can affect many parts of the body, including the skin, joints, and nervous system. This condition is caused by bacteria called Borrelia burgdorferi. You can get Lyme disease by being bitten by an infected tick. The main treatment for this condition is antibiotic medicine. This information is not intended to replace advice given to you by your health care provider. Make sure you discuss any questions you have with your health care provider. Document Revised: 07/13/2018 Document Reviewed: 06/07/2018 Elsevier Patient Education  [place].

## 2021-10-20 NOTE — Progress Notes (Signed)
Subjective: CC: Tick bite PCP: Claretta Fraise, MD XMI:WOEHO [name] [name] is a 56 y.o. male presenting to clinic today for:  1.  Tick bite Patient reports that it was found that he had a tick bite on his left back.  His wife remove this successfully and he thinks that she got all of it out.  He denies any fevers, headaches, chills, joint or body aches.  No nausea, vomiting or rashes.  He has quite a bit of itching at the site of the tick bite and they have been placing Neosporin on this area.  It was removed yesterday but had been attached to him for at least 3 days because he was doing his yard work over the weekend.   ROS: Per HPI  No Known Allergies Past Medical History:  Diagnosis Date   Chronic pain    COVID-19 12/2018   Dental crown present    Diabetes mellitus without complication (HCC)    Diverticulosis    Hypertension    no meds   Internal hemorrhoids     Current Outpatient Medications:    Accu-Chek Softclix Lancets lancets, USE TO CHECK BLOOD SUGAR BEFORE BREAKFAST AND 2 HOURS AFTER SUPPER DAILY Dx E11.9, Disp: 200 each, Rfl: 3   acetaminophen (TYLENOL) 500 MG tablet, Take 500 mg by mouth every 6 (six) hours as needed., Disp: , Rfl:    blood glucose meter kit and supplies KIT, Use up to four times daily as directed. (FOR ICD-10 : E11.9, Disp: 1 each, Rfl: 11   Blood Glucose Monitoring Suppl (ACCU-CHEK AVIVA PLUS) w/Device KIT, Use to check glucose before breakfast and 2 hours after supper, Disp: 1 kit, Rfl: 0   Blood Glucose Monitoring Suppl (ACCU-CHEK GUIDE) w/Device KIT, USE TO CHECK GLUCOSE BEFORE BREAKFAST AND 2 HOURS AFTER SUPPER Dx E11.9, Disp: 1 kit, Rfl: 0   cholecalciferol (VITAMIN [name]) 1000 UNITS tablet, Take 1,000 Units by mouth daily. 5000, Disp: , Rfl:    diclofenac (VOLTAREN) 75 MG EC tablet, TAKE 1 TABLET BY MOUTH TWICE A DAY, Disp: 50 tablet, Rfl: 2   fluticasone (FLONASE) 50 MCG/ACT nasal spray, SPRAY 1 SPRAY INTO BOTH NOSTRILS DAILY., Disp: 48 mL, Rfl: 1    glucose blood (ACCU-CHEK AVIVA PLUS) test strip, Use use to check glucose before breakfast and 2 hours after supper, Disp: 100 each, Rfl: 12   lisinopril-hydrochlorothiazide (ZESTORETIC) 20-25 MG tablet, TAKE 1 TABLET BY MOUTH EVERY DAY, Disp: 90 tablet, Rfl: 0   metFORMIN (GLUCOPHAGE) 500 MG tablet, TAKE 1 TABLET BY MOUTH 2 TIMES DAILY WITH A MEAL., Disp: 120 tablet, Rfl: 0   Multiple Vitamins-Minerals (MULTIVITAMIN WITH MINERALS) tablet, Take 1 tablet by mouth daily., Disp: , Rfl:    rosuvastatin (CRESTOR) 5 MG tablet, TAKE 1 TABLET BY MOUTH ON MONDAYS, WEDNESDAYS, AND FRIDAYS EVENINGS, Disp: 39 tablet, Rfl: 0 Social History   Socioeconomic History   Marital status: Married    Spouse name: Not on file   Number of children: Not on file   Years of education: Not on file   Highest education level: Not on file  Occupational History   Not on file  Tobacco Use   Smoking status: Never   Smokeless tobacco: Never  Vaping Use   Vaping Use: Never used  Substance and Sexual Activity   Alcohol use: No   Drug use: No   Sexual activity: Not on file  Other Topics Concern   Not on file  Social History Narrative   Not on file  Social Determinants of Health   Financial Resource Strain: Not on file  Food Insecurity: Not on file  Transportation Needs: Not on file  Physical Activity: Not on file  Stress: Not on file  Social Connections: Not on file  Intimate Partner Violence: Not on file   Family History  Problem Relation Age of Onset   Heart disease Mother    Diabetes Mother    Stroke Mother    Kidney cancer Father    Colon cancer Neg Hx    Rectal cancer Neg Hx     Objective: Office vital signs reviewed. BP (!) 157/95   Pulse (!) 55   Temp (!) 97.2 F (36.2 C)   Ht 5' 10"  (1.778 m)   Wt 186 lb 12.8 oz (84.7 kg)   SpO2 96%   BMI 26.80 kg/m   Physical Examination:  General: Awake, alert, well nourished, nontoxic.  No acute distress Skin: Slightly warm, red raised wheal  appreciated at the insect bite site on the left mid to upper back.  No exudates.  No target lesions.  Assessment/ Plan: 56 y.o. male   Tick bite of left back wall of thorax, initial encounter - Plan: doxycycline (VIBRA-TABS) 100 MG tablet  Prophylactic dose of doxycycline 200 mg p.o. once given.  We discussed signs and symptoms of Lyme disease and reasons for reevaluation/call to the office so that he can get treatment for Lyme disease should he develop signs or symptoms.  Handout provided outlining these.  Okay to continue topical Cortaid and or Benadryl to help with itching.  No evidence of secondary bacterial infection so I do not see a need for Neosporin at this time  No orders of the defined types were placed in this encounter.  No orders of the defined types were placed in this encounter.    [name] Norlander, DO [place] [phone]

## 2021-11-22 ENCOUNTER — Ambulatory Visit: Payer: 59 | Admitting: Family Medicine

## 2021-11-22 ENCOUNTER — Encounter: Payer: Self-pay | Admitting: Family Medicine

## 2021-11-22 VITALS — BP 132/70 | HR 53 | Temp 97.2°F | Ht 70.0 in | Wt 183.0 lb

## 2021-11-22 DIAGNOSIS — E1169 Type 2 diabetes mellitus with other specified complication: Secondary | ICD-10-CM

## 2021-11-22 DIAGNOSIS — Z125 Encounter for screening for malignant neoplasm of prostate: Secondary | ICD-10-CM

## 2021-11-22 DIAGNOSIS — E785 Hyperlipidemia, unspecified: Secondary | ICD-10-CM

## 2021-11-22 DIAGNOSIS — E119 Type 2 diabetes mellitus without complications: Secondary | ICD-10-CM

## 2021-11-22 DIAGNOSIS — I10 Essential (primary) hypertension: Secondary | ICD-10-CM

## 2021-11-22 LAB — BAYER DCA HB A1C WAIVED: HB A1C (BAYER DCA - WAIVED): 5.7 % — ABNORMAL HIGH (ref 4.8–5.6)

## 2021-11-22 MED ORDER — PENTOXIFYLLINE ER 400 MG PO TBCR: 400.0000 mg | EXTENDED_RELEASE_TABLET | Freq: Three times a day (TID) | ORAL | 0 refills | Status: DC

## 2021-11-22 MED ORDER — FLUTICASONE PROPIONATE 50 MCG/ACT NA SUSP: NASAL | 1 refills | Status: DC

## 2021-11-22 NOTE — Progress Notes (Signed)
Subjective:  Patient ID: [name] [name],  male    DOB: [birth year]  Age: 56 y.o.    CC: Medical Management of Chronic Issues   HPI [name] [name] presents for  follow-up of hypertension. Patient has no history of headache chest pain or shortness of breath or recent cough. Patient also denies symptoms of TIA such as numbness weakness lateralizing. Patient denies side effects from medication. States taking it regularly.  Patient also  in for follow-up of elevated cholesterol. Doing well without complaints on current medication. Denies side effects  including myalgia and arthralgia and nausea. Also in today for liver function testing. Currently no chest pain, shortness of breath or other cardiovascular related symptoms noted.  Follow-up of diabetes. Patient does check blood sugar at home. Readings run between 90 and 100 Patient denies symptoms such as excessive hunger or urinary frequency, excessive hunger, nausea No significant hypoglycemic spells noted. Medications reviewed. Pt reports taking them regularly. Pt. denies complication/adverse reaction today.   Urology - seen recently - Didn't recommend flomax or surgery. Taking cialis 5 mg daily. Also started on trental.   History [name] [name] has a past medical history of Chronic pain, COVID-19 (12/2018), Dental crown present, Diabetes mellitus without complication ([place]), Diverticulosis, Hypertension, and Internal hemorrhoids.   He has a past surgical history that includes Right shoulder surgery (2010); Right Bicep surgery (2008); Ganglion cyst excision (2009); Shoulder arthroscopy (98,09); Colonoscopy; Ganglion cyst excision (Left, 01/17/2013); and Carpal tunnel release (Right, 03/06/2014).   His family history includes Diabetes in his mother; Heart disease in his mother; Kidney cancer in his father; Stroke in his mother.He reports that he has never smoked. He has never used smokeless tobacco. He reports that he does not drink alcohol and does not use  drugs.  Current Outpatient Medications on File Prior to Visit  Medication Sig Dispense Refill   Accu-Chek Softclix Lancets lancets USE TO CHECK BLOOD SUGAR BEFORE BREAKFAST AND 2 HOURS AFTER SUPPER DAILY Dx E11.9 200 each 3   acetaminophen (TYLENOL) 500 MG tablet Take 500 mg by mouth every 6 (six) hours as needed.     blood glucose meter kit and supplies KIT Use up to four times daily as directed. (FOR ICD-10 : E11.9 1 each 11   cholecalciferol (VITAMIN D) 1000 UNITS tablet Take 1,000 Units by mouth daily. 5000     diclofenac (VOLTAREN) 75 MG EC tablet TAKE 1 TABLET BY MOUTH TWICE A DAY 50 tablet 2   glucose blood (ACCU-CHEK AVIVA PLUS) test strip Use use to check glucose before breakfast and 2 hours after supper 100 each 12   lisinopril-hydrochlorothiazide (ZESTORETIC) 20-25 MG tablet TAKE 1 TABLET BY MOUTH EVERY DAY 90 tablet 0   metFORMIN (GLUCOPHAGE) 500 MG tablet TAKE 1 TABLET BY MOUTH 2 TIMES DAILY WITH A MEAL. 120 tablet 0   Multiple Vitamins-Minerals (MULTIVITAMIN WITH MINERALS) tablet Take 1 tablet by mouth daily.     rosuvastatin (CRESTOR) 5 MG tablet TAKE 1 TABLET BY MOUTH ON MONDAYS, WEDNESDAYS, AND FRIDAYS EVENINGS 39 tablet 0   No current facility-administered medications on file prior to visit.    ROS Review of Systems  Constitutional:  Negative for fever.  Respiratory:  Negative for shortness of breath.   Cardiovascular:  Negative for chest pain.  Musculoskeletal:  Negative for arthralgias.  Skin:  Negative for rash.    Objective:  BP 132/70   Pulse (!) 53   Temp (!) 97.2 F (36.2 C)   Ht 5' 10"  (1.778  m)   Wt 183 lb (83 kg)   SpO2 97%   BMI 26.26 kg/m   BP Readings from Last 3 Encounters:  11/22/21 132/70  10/20/21 (!) 157/95  08/18/21 106/61    Wt Readings from Last 3 Encounters:  11/22/21 183 lb (83 kg)  10/20/21 186 lb 12.8 oz (84.7 kg)  08/18/21 181 lb 9.6 oz (82.4 kg)     Physical Exam Vitals reviewed.  Constitutional:      Appearance: He is  well-developed.  HENT:     Head: Normocephalic and atraumatic.     Right Ear: External ear normal.     Left Ear: External ear normal.     Mouth/Throat:     Pharynx: No oropharyngeal exudate or posterior oropharyngeal erythema.  Eyes:     Pupils: Pupils are equal, round, and reactive to light.  Cardiovascular:     Rate and Rhythm: Normal rate and regular rhythm.     Heart sounds: No murmur heard. Pulmonary:     Effort: No respiratory distress.     Breath sounds: Normal breath sounds.  Musculoskeletal:     Cervical back: Normal range of motion and neck supple.  Neurological:     Mental Status: He is alert and oriented to person, place, and time.     Diabetic Foot Exam - Simple   No data filed     Lab Results  Component Value Date   HGBA1C 6.1 (H) 08/18/2021   HGBA1C 6.3 (H) 04/20/2021   HGBA1C 6.8 10/14/2020    Assessment & Plan:   [name] was seen today for medical management of chronic issues.  Diagnoses and all orders for this visit:  Type 2 diabetes mellitus without complication, without long-term current use of insulin (HCC) -     Bayer DCA Hb A1c Waived  Dyslipidemia associated with type 2 diabetes mellitus ([place]) -     Lipid panel  Essential hypertension -     CBC with Differential/Platelet -     CMP14+EGFR  Screening for prostate cancer -     PSA, total and free  Other orders -     fluticasone (FLONASE) 50 MCG/ACT nasal spray; SPRAY 1 SPRAY INTO BOTH NOSTRILS DAILY. -     pentoxifylline (TRENTAL) 400 MG CR tablet; Take 1 tablet (400 mg total) by mouth 3 (three) times daily with meals. For penile pain   I have discontinued [name] [name]'s Accu-Chek Guide. I am also having him start on pentoxifylline. Additionally, I am having him maintain his multivitamin with minerals, cholecalciferol, acetaminophen, blood glucose meter kit and supplies, diclofenac, Accu-Chek Aviva Plus, Accu-Chek Softclix Lancets, rosuvastatin, metFORMIN, lisinopril-hydrochlorothiazide,  and fluticasone.  Meds ordered this encounter  Medications   fluticasone (FLONASE) 50 MCG/ACT nasal spray    Sig: SPRAY 1 SPRAY INTO BOTH NOSTRILS DAILY.    Dispense:  48 mL    Refill:  1   pentoxifylline (TRENTAL) 400 MG CR tablet    Sig: Take 1 tablet (400 mg total) by mouth 3 (three) times daily with meals. For penile pain    Dispense:  90 tablet    Refill:  0     Follow-up: Return in about 3 months (around 02/22/2022).  Claretta Fraise, M.D.

## 2021-11-23 LAB — CMP14+EGFR
ALT: 17 IU/L (ref 0–44)
AST: 19 IU/L (ref 0–40)
Albumin/Globulin Ratio: 1.7 (ref 1.2–2.2)
Albumin: 4.3 g/dL (ref 3.8–4.9)
Alkaline Phosphatase: 50 IU/L (ref 44–121)
BUN/Creatinine Ratio: 23 — ABNORMAL HIGH (ref 9–20)
BUN: 22 mg/dL (ref 6–24)
Bilirubin Total: 0.7 mg/dL (ref 0.0–1.2)
CO2: 25 mmol/L (ref 20–29)
Calcium: 9.9 mg/dL (ref 8.7–10.2)
Chloride: 101 mmol/L (ref 96–106)
Creatinine, Ser: 0.97 mg/dL (ref 0.76–1.27)
Globulin, Total: 2.5 g/dL (ref 1.5–4.5)
Glucose: 134 mg/dL — ABNORMAL HIGH (ref 70–99)
Potassium: 4.6 mmol/L (ref 3.5–5.2)
Sodium: 142 mmol/L (ref 134–144)
Total Protein: 6.8 g/dL (ref 6.0–8.5)
eGFR: 92 mL/min/{1.73_m2} (ref >59)

## 2021-11-23 LAB — CBC WITH DIFFERENTIAL/PLATELET
Basophils Absolute: 0.1 10*3/uL (ref 0.0–0.2)
Basos: 1 %
EOS (ABSOLUTE): 0.2 10*3/uL (ref 0.0–0.4)
Eos: 4 %
Hematocrit: 40.9 % (ref 37.5–51.0)
Hemoglobin: 14 g/dL (ref 13.0–17.7)
Immature Grans (Abs): 0 10*3/uL (ref 0.0–0.1)
Immature Granulocytes: 0 %
Lymphocytes Absolute: 2 10*3/uL (ref 0.7–3.1)
Lymphs: 34 %
MCH: 29.8 pg (ref 26.6–33.0)
MCHC: 34.2 g/dL (ref 31.5–35.7)
MCV: 87 fL (ref 79–97)
Monocytes Absolute: 0.7 10*3/uL (ref 0.1–0.9)
Monocytes: 12 %
Neutrophils Absolute: 2.9 10*3/uL (ref 1.4–7.0)
Neutrophils: 49 %
Platelets: 332 10*3/uL (ref 150–450)
RBC: 4.7 x10E6/uL (ref 4.14–5.80)
RDW: 12.4 % (ref 11.6–15.4)
WBC: 5.9 10*3/uL (ref 3.4–10.8)

## 2021-11-23 LAB — LIPID PANEL
Chol/HDL Ratio: 2.7 ratio (ref 0.0–5.0)
Cholesterol, Total: 133 mg/dL (ref 100–199)
HDL: 50 mg/dL (ref >39)
LDL Chol Calc (NIH): 72 mg/dL (ref 0–99)
Triglycerides: 47 mg/dL (ref 0–149)
VLDL Cholesterol Cal: 11 mg/dL (ref 5–40)

## 2021-11-23 LAB — PSA, TOTAL AND FREE
PSA, Free Pct: 30 %
PSA, Free: 0.18 ng/mL
Prostate Specific Ag, Serum: 0.6 ng/mL (ref 0.0–4.0)

## 2021-11-23 NOTE — Progress Notes (Signed)
Hello Agustine,  Your lab result is normal and/or stable.Some minor variations that are not significant are commonly marked abnormal, but do not represent any medical problem for you.  Best regards, Priya Matsen, M.D.

## 2021-12-10 ENCOUNTER — Other Ambulatory Visit: Payer: Self-pay | Admitting: Family Medicine

## 2021-12-10 DIAGNOSIS — E119 Type 2 diabetes mellitus without complications: Secondary | ICD-10-CM

## 2021-12-20 ENCOUNTER — Ambulatory Visit: Payer: 59 | Admitting: Nurse Practitioner

## 2021-12-24 LAB — HM DIABETES EYE EXAM

## 2022-01-13 ENCOUNTER — Other Ambulatory Visit: Payer: Self-pay | Admitting: Family Medicine

## 2022-01-13 DIAGNOSIS — E7800 Pure hypercholesterolemia, unspecified: Secondary | ICD-10-CM

## 2022-01-13 DIAGNOSIS — I10 Essential (primary) hypertension: Secondary | ICD-10-CM

## 2022-02-10 ENCOUNTER — Other Ambulatory Visit: Payer: Self-pay | Admitting: Family Medicine

## 2022-02-10 DIAGNOSIS — E119 Type 2 diabetes mellitus without complications: Secondary | ICD-10-CM

## 2022-02-23 ENCOUNTER — Encounter: Payer: Self-pay | Admitting: Family Medicine

## 2022-02-23 ENCOUNTER — Ambulatory Visit: Payer: 59 | Admitting: Family Medicine

## 2022-02-23 VITALS — BP 138/85 | HR 55 | Temp 97.5°F | Ht 70.0 in | Wt 183.4 lb

## 2022-02-23 DIAGNOSIS — E1169 Type 2 diabetes mellitus with other specified complication: Secondary | ICD-10-CM

## 2022-02-23 DIAGNOSIS — I10 Essential (primary) hypertension: Secondary | ICD-10-CM

## 2022-02-23 DIAGNOSIS — E785 Hyperlipidemia, unspecified: Secondary | ICD-10-CM

## 2022-02-23 DIAGNOSIS — E7800 Pure hypercholesterolemia, unspecified: Secondary | ICD-10-CM | POA: Diagnosis not present

## 2022-02-23 DIAGNOSIS — Z23 Encounter for immunization: Secondary | ICD-10-CM | POA: Diagnosis not present

## 2022-02-23 DIAGNOSIS — E119 Type 2 diabetes mellitus without complications: Secondary | ICD-10-CM

## 2022-02-23 LAB — CBC WITH DIFFERENTIAL/PLATELET
Basophils Absolute: 0.1 10*3/uL (ref 0.0–0.2)
Basos: 1 %
EOS (ABSOLUTE): 0.3 10*3/uL (ref 0.0–0.4)
Eos: 6 %
Hematocrit: 44.4 % (ref 37.5–51.0)
Hemoglobin: 14.7 g/dL (ref 13.0–17.7)
Immature Grans (Abs): 0 10*3/uL (ref 0.0–0.1)
Immature Granulocytes: 0 %
Lymphocytes Absolute: 1.7 10*3/uL (ref 0.7–3.1)
Lymphs: 34 %
MCH: 29.3 pg (ref 26.6–33.0)
MCHC: 33.1 g/dL (ref 31.5–35.7)
MCV: 89 fL (ref 79–97)
Monocytes Absolute: 0.6 10*3/uL (ref 0.1–0.9)
Monocytes: 12 %
Neutrophils Absolute: 2.3 10*3/uL (ref 1.4–7.0)
Neutrophils: 47 %
Platelets: 354 10*3/uL (ref 150–450)
RBC: 5.01 x10E6/uL (ref 4.14–5.80)
RDW: 12.6 % (ref 11.6–15.4)
WBC: 4.9 10*3/uL (ref 3.4–10.8)

## 2022-02-23 LAB — CMP14+EGFR
ALT: 18 IU/L (ref 0–44)
AST: 19 IU/L (ref 0–40)
Albumin/Globulin Ratio: 2.3 — ABNORMAL HIGH (ref 1.2–2.2)
Albumin: 4.9 g/dL (ref 3.8–4.9)
Alkaline Phosphatase: 47 IU/L (ref 44–121)
BUN/Creatinine Ratio: 14 (ref 9–20)
BUN: 14 mg/dL (ref 6–24)
Bilirubin Total: 0.8 mg/dL (ref 0.0–1.2)
CO2: 24 mmol/L (ref 20–29)
Calcium: 9.7 mg/dL (ref 8.7–10.2)
Chloride: 100 mmol/L (ref 96–106)
Creatinine, Ser: 1.01 mg/dL (ref 0.76–1.27)
Globulin, Total: 2.1 g/dL (ref 1.5–4.5)
Glucose: 109 mg/dL — ABNORMAL HIGH (ref 70–99)
Potassium: 4.5 mmol/L (ref 3.5–5.2)
Sodium: 137 mmol/L (ref 134–144)
Total Protein: 7 g/dL (ref 6.0–8.5)
eGFR: 87 mL/min/{1.73_m2} (ref >59)

## 2022-02-23 LAB — LIPID PANEL
Chol/HDL Ratio: 2.7 ratio (ref 0.0–5.0)
Cholesterol, Total: 126 mg/dL (ref 100–199)
HDL: 46 mg/dL (ref >39)
LDL Chol Calc (NIH): 65 mg/dL (ref 0–99)
Triglycerides: 73 mg/dL (ref 0–149)
VLDL Cholesterol Cal: 15 mg/dL (ref 5–40)

## 2022-02-23 LAB — BAYER DCA HB A1C WAIVED: HB A1C (BAYER DCA - WAIVED): 6.3 % — ABNORMAL HIGH (ref 4.8–5.6)

## 2022-02-23 MED ORDER — LISINOPRIL-HYDROCHLOROTHIAZIDE 20-25 MG PO TABS: 1.0000 | ORAL_TABLET | Freq: Every day | ORAL | 2 refills | Status: DC

## 2022-02-23 MED ORDER — METFORMIN HCL 500 MG PO TABS: 500.0000 mg | ORAL_TABLET | Freq: Two times a day (BID) | ORAL | 2 refills | Status: DC

## 2022-02-23 MED ORDER — PENTOXIFYLLINE ER 400 MG PO TBCR: 400.0000 mg | EXTENDED_RELEASE_TABLET | Freq: Three times a day (TID) | ORAL | 2 refills | Status: DC

## 2022-02-23 MED ORDER — ROSUVASTATIN CALCIUM 5 MG PO TABS: ORAL_TABLET | ORAL | 2 refills | Status: DC
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
## 2022-02-23 NOTE — Addendum Note (Signed)
Addended by: Baldomero Lamy B on: 02/23/2022 01:47 PM   Modules accepted: Orders

## 2022-02-28 NOTE — Progress Notes (Signed)
Hello [place],  Your lab result is normal and/or stable.Some minor variations that are not significant are commonly marked abnormal, but do not represent any medical problem for you.  Best regards, Claretta Fraise, M.D.

## 2022-05-19 ENCOUNTER — Ambulatory Visit: Payer: 59 | Admitting: Podiatry

## 2022-05-19 ENCOUNTER — Encounter: Payer: Self-pay | Admitting: Podiatry

## 2022-05-19 DIAGNOSIS — M7751 Other enthesopathy of right foot: Secondary | ICD-10-CM | POA: Diagnosis not present

## 2022-05-19 DIAGNOSIS — M2041 Other hammer toe(s) (acquired), right foot: Secondary | ICD-10-CM

## 2022-05-19 MED ORDER — TRIAMCINOLONE ACETONIDE 10 MG/ML IJ SUSP
10.0000 mg | Freq: Once | INTRAMUSCULAR | Status: AC
  Administered 2022-05-19: 10 mg

## 2022-05-19 NOTE — Progress Notes (Signed)
Subjective:   Patient ID: [name] [name], male   DOB: 57 y.o.   MRN: [id number]   HPI Patient presents stating that he is having a lot of pain between the fourth and fifth toes on his right foot and is not sure what may have happened.  States that he does not remember injury and it is more of a burning like pain with certain shoes   ROS      Objective:  Physical Exam  Neurovascular status intact with inflammation of the fourth interspace right and around the fourth MPJ with no current indication of corn callus formation between the 2 toes and just a small little fissure on the plantar aspect of the fifth digit     Assessment:  Appears to be some form of inflammatory condition does not appear to be other pathology at the current time     Plan:  H&P reviewed did do sterile prep injected the fourth MPJ 2 mg Dexasone Kenalog 5 mg Xylocaine advised on wider shoes reappoint to recheck  X-rays were negative for signs of bony calcification or what appears to be bony pathology associated with condition

## 2022-05-30 ENCOUNTER — Ambulatory Visit: Payer: 59 | Admitting: Family Medicine

## 2022-05-31 ENCOUNTER — Other Ambulatory Visit: Payer: Self-pay | Admitting: Family Medicine

## 2022-05-31 ENCOUNTER — Encounter: Payer: Self-pay | Admitting: Family Medicine

## 2022-06-14 ENCOUNTER — Encounter: Payer: Self-pay | Admitting: Internal Medicine

## 2022-07-04 ENCOUNTER — Ambulatory Visit: Payer: 59 | Admitting: Family Medicine

## 2022-08-09 ENCOUNTER — Ambulatory Visit: Payer: 59 | Admitting: Family Medicine

## 2022-08-09 VITALS — BP 109/68 | HR 64 | Temp 98.0°F | Ht 70.0 in | Wt 178.8 lb

## 2022-08-09 DIAGNOSIS — Z7984 Long term (current) use of oral hypoglycemic drugs: Secondary | ICD-10-CM | POA: Diagnosis not present

## 2022-08-09 DIAGNOSIS — I10 Essential (primary) hypertension: Secondary | ICD-10-CM | POA: Diagnosis not present

## 2022-08-09 DIAGNOSIS — K2100 Gastro-esophageal reflux disease with esophagitis, without bleeding: Secondary | ICD-10-CM | POA: Diagnosis not present

## 2022-08-09 DIAGNOSIS — E1169 Type 2 diabetes mellitus with other specified complication: Secondary | ICD-10-CM | POA: Diagnosis not present

## 2022-08-09 DIAGNOSIS — E119 Type 2 diabetes mellitus without complications: Secondary | ICD-10-CM

## 2022-08-09 DIAGNOSIS — E785 Hyperlipidemia, unspecified: Secondary | ICD-10-CM

## 2022-08-09 LAB — BAYER DCA HB A1C WAIVED: HB A1C (BAYER DCA - WAIVED): 6.1 % — ABNORMAL HIGH (ref 4.8–5.6)

## 2022-08-09 MED ORDER — PANTOPRAZOLE SODIUM 40 MG PO TBEC
40.0000 mg | DELAYED_RELEASE_TABLET | Freq: Every day | ORAL | 3 refills | Status: DC
Start: 2022-08-09 — End: 2023-08-03

## 2022-08-09 NOTE — Progress Notes (Signed)
Subjective:  Patient ID: [name] [name],  male    DOB: 1965/11/30  Age: 57 y.o.    CC: Medical Management of Chronic Issues   HPI LION SCHRANTZ presents for  follow-up of hypertension. Patient has no history of headache chest pain or shortness of breath or recent cough. Patient also denies symptoms of TIA such as numbness weakness lateralizing. Patient denies side effects from medication. States taking it regularly.  Patient also  in for follow-up of elevated cholesterol. Doing well without complaints on current medication. Denies side effects  including myalgia and arthralgia and nausea. Also in today for liver function testing. Currently no chest pain, shortness of breath or other cardiovascular related symptoms noted. Follow-up of diabetes. Patient does check blood sugar at home. Readings run between 90s fasting and 120-130s PP Patient denies symptoms such as excessive hunger or urinary frequency, excessive hunger, nausea No significant hypoglycemic spells noted. Medications reviewed. Pt reports taking them regularly. Pt. denies complication/adverse reaction today.   Having heartburn 3 nights a week waking him up. TUMS & Pepcid AC help some.    History Collier has a past medical history of Chronic pain, COVID-19 (12/2018), Dental crown present, Diabetes mellitus without complication (HCC), Diverticulosis, Hypertension, and Internal hemorrhoids.   He has a past surgical history that includes Right shoulder surgery (2010); Right Bicep surgery (2008); Ganglion cyst excision (2009); Shoulder arthroscopy (98,09); Colonoscopy; Ganglion cyst excision (Left, 01/17/2013); and Carpal tunnel release (Right, 03/06/2014).   His family history includes Diabetes in his mother; Heart disease in his mother; Kidney cancer in his father; Stroke in his mother.He reports that he has never smoked. He has never used smokeless tobacco. He reports that he does not drink alcohol and does not use drugs.  Current  Outpatient Medications on File Prior to Visit  Medication Sig Dispense Refill   Accu-Chek Softclix Lancets lancets USE TO CHECK BLOOD SUGAR BEFORE BREAKFAST AND 2 HOURS AFTER SUPPER DAILY Dx E11.9 200 each 3   acetaminophen (TYLENOL) 500 MG tablet Take 500 mg by mouth every 6 (six) hours as needed.     blood glucose meter kit and supplies KIT Use up to four times daily as directed. (FOR ICD-10 : E11.9 1 each 11   cholecalciferol (VITAMIN D) 1000 UNITS tablet Take 1,000 Units by mouth daily. 5000     diclofenac (VOLTAREN) 75 MG EC tablet TAKE 1 TABLET BY MOUTH TWICE A DAY 50 tablet 2   fluticasone (FLONASE) 50 MCG/ACT nasal spray INSTILL 1 SPRAY INTO BOTH NOSTRILS DAILY 48 mL 1   glucose blood (ACCU-CHEK AVIVA PLUS) test strip Use use to check glucose before breakfast and 2 hours after supper 100 each 12   lisinopril-hydrochlorothiazide (ZESTORETIC) 20-25 MG tablet Take 1 tablet by mouth daily. 90 tablet 2   metFORMIN (GLUCOPHAGE) 500 MG tablet Take 1 tablet (500 mg total) by mouth 2 (two) times daily with a meal. 180 tablet 2   Multiple Vitamins-Minerals (MULTIVITAMIN WITH MINERALS) tablet Take 1 tablet by mouth daily.     pentoxifylline (TRENTAL) 400 MG CR tablet Take 1 tablet (400 mg total) by mouth 3 (three) times daily with meals. For penile pain 90 tablet 2   rosuvastatin (CRESTOR) 5 MG tablet TAKE 1 TABLET BY MOUTH ON MONDAYS, WEDNESDAYS, AND FRIDAYS EVENINGS 39 tablet 2   No current facility-administered medications on file prior to visit.    ROS Review of Systems  Constitutional:  Negative for fever.  Respiratory:  Negative for shortness of  breath.   Cardiovascular:  Negative for chest pain.  Gastrointestinal:  Positive for abdominal pain (heartburn).  Musculoskeletal:  Negative for arthralgias.       Sensation of pulling between right 4-5 toes when walking. Had injection by podiatry that didn't help. Currently working on antifungal tx with a second podiatrist.    Skin:  Negative  for rash.    Objective:  BP 109/68   Pulse 64   Temp 98 F (36.7 C)   Ht 5\' 10"  (1.778 m)   Wt 178 lb 12.8 oz (81.1 kg)   SpO2 95%   BMI 25.66 kg/m   BP Readings from Last 3 Encounters:  08/09/22 109/68  02/23/22 138/85  11/22/21 132/70    Wt Readings from Last 3 Encounters:  08/09/22 178 lb 12.8 oz (81.1 kg)  02/23/22 183 lb 6.4 oz (83.2 kg)  11/22/21 183 lb (83 kg)     Physical Exam Vitals reviewed.  Constitutional:      Appearance: He is well-developed.  HENT:     Head: Normocephalic and atraumatic.     Right Ear: External ear normal.     Left Ear: External ear normal.     Mouth/Throat:     Pharynx: No oropharyngeal exudate or posterior oropharyngeal erythema.  Eyes:     Pupils: Pupils are equal, round, and reactive to light.  Cardiovascular:     Rate and Rhythm: Normal rate and regular rhythm.     Heart sounds: No murmur heard. Pulmonary:     Effort: No respiratory distress.     Breath sounds: Normal breath sounds.  Musculoskeletal:     Cervical back: Normal range of motion and neck supple.  Neurological:     Mental Status: He is alert and oriented to person, place, and time.     Diabetic Foot Exam - Simple   No data filed     Lab Results  Component Value Date   HGBA1C 6.3 (H) 02/23/2022   HGBA1C 5.7 (H) 11/22/2021   HGBA1C 6.1 (H) 08/18/2021    Assessment & Plan:   Niranjan was seen today for medical management of chronic issues.  Diagnoses and all orders for this visit:  Type 2 diabetes mellitus without complication, without long-term current use of insulin (HCC) -     Bayer DCA Hb A1c Waived  Essential hypertension -     CBC with Differential/Platelet -     CMP14+EGFR  Dyslipidemia associated with type 2 diabetes mellitus (HCC) -     Lipid panel  Gastroesophageal reflux disease with esophagitis without hemorrhage  Other orders -     pantoprazole (PROTONIX) 40 MG tablet; Take 1 tablet (40 mg total) by mouth daily. For  stomach   I am having Yosgar D. Epps start on pantoprazole. I am also having him maintain his multivitamin with minerals, cholecalciferol, acetaminophen, blood glucose meter kit and supplies, diclofenac, Accu-Chek Aviva Plus, Accu-Chek Softclix Lancets, lisinopril-hydrochlorothiazide, metFORMIN, pentoxifylline, rosuvastatin, and fluticasone.  Meds ordered this encounter  Medications   pantoprazole (PROTONIX) 40 MG tablet    Sig: Take 1 tablet (40 mg total) by mouth daily. For stomach    Dispense:  90 tablet    Refill:  3     Follow-up: Return in about 6 months (around 02/09/2023) for Compete physical, diabetes.  Mechele Claude, M.D.

## 2022-08-10 LAB — CMP14+EGFR
ALT: 25 IU/L (ref 0–44)
AST: 22 IU/L (ref 0–40)
Albumin/Globulin Ratio: 2.2 (ref 1.2–2.2)
Albumin: 4.8 g/dL (ref 3.8–4.9)
Alkaline Phosphatase: 51 IU/L (ref 44–121)
BUN/Creatinine Ratio: 18 (ref 9–20)
BUN: 17 mg/dL (ref 6–24)
Bilirubin Total: 0.8 mg/dL (ref 0.0–1.2)
CO2: 21 mmol/L (ref 20–29)
Calcium: 9.9 mg/dL (ref 8.7–10.2)
Chloride: 99 mmol/L (ref 96–106)
Creatinine, Ser: 0.93 mg/dL (ref 0.76–1.27)
Globulin, Total: 2.2 g/dL (ref 1.5–4.5)
Glucose: 101 mg/dL — ABNORMAL HIGH (ref 70–99)
Potassium: 4.3 mmol/L (ref 3.5–5.2)
Sodium: 139 mmol/L (ref 134–144)
Total Protein: 7 g/dL (ref 6.0–8.5)
eGFR: 96 mL/min/{1.73_m2} (ref >59)

## 2022-08-10 LAB — CBC WITH DIFFERENTIAL/PLATELET
Basophils Absolute: 0.1 10*3/uL (ref 0.0–0.2)
Basos: 1 %
EOS (ABSOLUTE): 0.3 10*3/uL (ref 0.0–0.4)
Eos: 5 %
Hematocrit: 43.6 % (ref 37.5–51.0)
Hemoglobin: 14.8 g/dL (ref 13.0–17.7)
Immature Grans (Abs): 0 10*3/uL (ref 0.0–0.1)
Immature Granulocytes: 0 %
Lymphocytes Absolute: 1.7 10*3/uL (ref 0.7–3.1)
Lymphs: 32 %
MCH: 30 pg (ref 26.6–33.0)
MCHC: 33.9 g/dL (ref 31.5–35.7)
MCV: 88 fL (ref 79–97)
Monocytes Absolute: 0.6 10*3/uL (ref 0.1–0.9)
Monocytes: 12 %
Neutrophils Absolute: 2.6 10*3/uL (ref 1.4–7.0)
Neutrophils: 50 %
Platelets: 361 10*3/uL (ref 150–450)
RBC: 4.94 x10E6/uL (ref 4.14–5.80)
RDW: 12.8 % (ref 11.6–15.4)
WBC: 5.3 10*3/uL (ref 3.4–10.8)

## 2022-08-10 LAB — LIPID PANEL
Chol/HDL Ratio: 3 ratio (ref 0.0–5.0)
Cholesterol, Total: 145 mg/dL (ref 100–199)
HDL: 49 mg/dL (ref >39)
LDL Chol Calc (NIH): 85 mg/dL (ref 0–99)
Triglycerides: 51 mg/dL (ref 0–149)
VLDL Cholesterol Cal: 11 mg/dL (ref 5–40)

## 2022-08-14 NOTE — Progress Notes (Signed)
Hello  Che,    Your lab result is normal and/or stable.Some minor variations that are not significant are commonly marked abnormal, but do not represent any medical problem for you.   Best regards,  Prince Olivier, M.D.

## 2022-08-16 ENCOUNTER — Telehealth: Payer: Self-pay | Admitting: *Deleted

## 2022-08-16 NOTE — Telephone Encounter (Signed)
Pt. Scheduled for recall 09/19/22,pre-visit 08/22/22,he  is taking Trental please review charts and advise on this medication ? Thank you

## 2022-08-18 NOTE — Telephone Encounter (Signed)
No hold required for this med

## 2022-08-18 NOTE — Telephone Encounter (Signed)
Please advise on the attached message. Does Trental require a 7 day hold?

## 2022-08-22 ENCOUNTER — Ambulatory Visit (AMBULATORY_SURGERY_CENTER): Payer: 59 | Admitting: *Deleted

## 2022-08-22 VITALS — Ht 70.0 in | Wt 181.0 lb

## 2022-08-22 DIAGNOSIS — Z86010 Personal history of colonic polyps: Secondary | ICD-10-CM

## 2022-08-22 MED ORDER — NA SULFATE-K SULFATE-MG SULF 17.5-3.13-1.6 GM/177ML PO SOLN
1.0000 | Freq: Once | ORAL | 0 refills | Status: AC
Start: 2022-08-22 — End: 2022-08-22

## 2022-08-22 NOTE — Progress Notes (Signed)
Pre visit completed in person. Instructions forwarded through [facility]    No egg or soy allergy known to patient  No issues known to pt with past sedation with any surgeries or procedures Patient denies ever being told they had issues or difficulty with intubation  No FH of Malignant Hyperthermia Pt is not on diet pills Pt is not on  home 02  Pt is not on blood thinners  Pt denies issues with constipation  No A fib or A flutter Have any cardiac testing pending-  NO Pt instructed to use [email or web address] or GoodRx for a price reduction on prep    Patient advised to contact us if he experiences any changes to health or medications.

## 2022-08-28 ENCOUNTER — Other Ambulatory Visit: Payer: Self-pay | Admitting: Family Medicine

## 2022-09-07 ENCOUNTER — Encounter: Payer: Self-pay | Admitting: Internal Medicine

## 2022-09-19 ENCOUNTER — Ambulatory Visit (AMBULATORY_SURGERY_CENTER): Payer: 59 | Admitting: Internal Medicine

## 2022-09-19 ENCOUNTER — Encounter: Payer: Self-pay | Admitting: Internal Medicine

## 2022-09-19 VITALS — BP 114/67 | HR 48 | Temp 98.3°F | Resp 12 | Ht 70.0 in

## 2022-09-19 DIAGNOSIS — Z09 Encounter for follow-up examination after completed treatment for conditions other than malignant neoplasm: Secondary | ICD-10-CM

## 2022-09-19 DIAGNOSIS — Z86010 Personal history of colonic polyps: Secondary | ICD-10-CM | POA: Diagnosis not present

## 2022-09-19 DIAGNOSIS — D123 Benign neoplasm of transverse colon: Secondary | ICD-10-CM

## 2022-09-19 DIAGNOSIS — D124 Benign neoplasm of descending colon: Secondary | ICD-10-CM | POA: Diagnosis not present

## 2022-09-19 MED ORDER — SODIUM CHLORIDE 0.9 % IV SOLN
500.0000 mL | Freq: Once | INTRAVENOUS | Status: DC
Start: 2022-09-19 — End: 2022-09-19

## 2022-09-19 NOTE — Progress Notes (Signed)
Vss nad trans to pacu 

## 2022-09-19 NOTE — Patient Instructions (Signed)
Thank you for coming in to see [place] today. Resume your diet and medications today. Return to regular daily activities tomorrow. Biopsy results will be available in 1-2 weeks at which time recommendation will be made for future surveillance colonoscopy.    YOU HAD AN ENDOSCOPIC PROCEDURE TODAY AT THE [facility]:   Refer to the procedure report that was given to you for any specific questions about what was found during the examination.  If the procedure report does not answer your questions, please call your gastroenterologist to clarify.  If you requested that your care partner not be given the details of your procedure findings, then the procedure report has been included in a sealed envelope for you to review at your convenience later.  YOU SHOULD EXPECT: Some feelings of bloating in the abdomen. Passage of more gas than usual.  Walking can help get rid of the air that was put into your GI tract during the procedure and reduce the bloating. If you had a lower endoscopy (such as a colonoscopy or flexible sigmoidoscopy) you may notice spotting of blood in your stool or on the toilet paper. If you underwent a bowel prep for your procedure, you may not have a normal bowel movement for a few days.  Please Note:  You might notice some irritation and congestion in your nose or some drainage.  This is from the oxygen used during your procedure.  There is no need for concern and it should clear up in a day or so.  SYMPTOMS TO REPORT IMMEDIATELY:  Following lower endoscopy (colonoscopy or flexible sigmoidoscopy):  Excessive amounts of blood in the stool  Significant tenderness or worsening of abdominal pains  Swelling of the abdomen that is new, acute  Fever of 100F or higher    For urgent or emergent issues, a gastroenterologist can be reached at any hour by calling [phone]. Do not use MyChart messaging for urgent concerns.    DIET:  We do recommend a small meal at first, but  then you may proceed to your regular diet.  Drink plenty of fluids but you should avoid alcoholic beverages for 24 hours.  ACTIVITY:  You should plan to take it easy for the rest of today and you should NOT DRIVE or use heavy machinery until tomorrow (because of the sedation medicines used during the test).    FOLLOW UP: Our staff will call the number listed on your records the next business day following your procedure.  We will call around 7:15- 8:00 am to check on you and address any questions or concerns that you may have regarding the information given to you following your procedure. If we do not reach you, we will leave a message.     If any biopsies were taken you will be contacted by phone or by letter within the next 1-3 weeks.  Please call us at [phone] if you have not heard about the biopsies in 3 weeks.    SIGNATURES/CONFIDENTIALITY: You and/or your care partner have signed paperwork which will be entered into your electronic medical record.  These signatures attest to the fact that that the information above on your After Visit Summary has been reviewed and is understood.  Full responsibility of the confidentiality of this discharge information lies with you and/or your care-partner.

## 2022-09-19 NOTE — Op Note (Signed)
[facility] Patient Name: [name] [name] Procedure Date: 09/19/2022 8:28 AM MRN: [id number] Endoscopist: Beverley Fiedler , MD, [id number] Age: 57 Referring MD:  Date of Birth: 1965-06-16 Gender: Male Account #: [id number] Procedure:                Colonoscopy Indications:              High risk colon cancer surveillance: Personal                            history of sessile serrated colon polyp (less than                            10 mm in size) with no dysplasia, Last colonoscopy:                            June 2019 Medicines:                Monitored Anesthesia Care Procedure:                Pre-Anesthesia Assessment:                           - Prior to the procedure, a History and Physical                            was performed, and patient medications and                            allergies were reviewed. The patient's tolerance of                            previous anesthesia was also reviewed. The risks                            and benefits of the procedure and the sedation                            options and risks were discussed with the patient.                            All questions were answered, and informed consent                            was obtained. Prior Anticoagulants: The patient has                            taken no anticoagulant or antiplatelet agents. ASA                            Grade Assessment: II - A patient with mild systemic                            disease. After reviewing the risks and benefits,  the patient was deemed in satisfactory condition to                            undergo the procedure.                           After obtaining informed consent, the colonoscope                            was passed under direct vision. Throughout the                            procedure, the patient's blood pressure, pulse, and                            oxygen saturations were monitored continuously. The                             CF [id number] #[id number] was introduced through the anus                            and advanced to the cecum, identified by                            appendiceal orifice and ileocecal valve. The                            colonoscopy was performed without difficulty. The                            patient tolerated the procedure well. The quality                            of the bowel preparation was good. The ileocecal                            valve, appendiceal orifice, and rectum were                            photographed. Scope In: 8:34:30 AM Scope Out: 8:51:35 AM Scope Withdrawal Time: 0 hours 13 minutes 0 seconds  Total Procedure Duration: 0 hours 17 minutes 5 seconds  Findings:                 The digital rectal exam was normal.                           A 4 mm polyp was found in the transverse colon. The                            polyp was sessile. The polyp was removed with a                            cold snare. Resection and retrieval were complete.  A 4 mm polyp was found in the descending colon. The                            polyp was sessile. The polyp was removed with a                            cold snare. Resection and retrieval were complete.                           A few small-mouthed diverticula were found in the                            sigmoid colon, descending colon and transverse                            colon.                           Internal hemorrhoids were found during                            retroflexion. The hemorrhoids were medium-sized. Complications:            No immediate complications. Estimated Blood Loss:     Estimated blood loss: none. Impression:               - One 4 mm polyp in the transverse colon, removed                            with a cold snare. Resected and retrieved.                           - One 4 mm polyp in the descending colon, removed                            with a  cold snare. Resected and retrieved.                           - Mild diverticulosis in the sigmoid colon, in the                            descending colon and in the transverse colon.                           - Internal hemorrhoids with banding scar visible. Recommendation:           - Patient has a contact number available for                            emergencies. The signs and symptoms of potential                            delayed complications were discussed with the  patient. Return to normal activities tomorrow.                            Written discharge instructions were provided to the                            patient.                           - Resume previous diet.                           - Continue present medications.                           - Await pathology results.                           - Repeat colonoscopy is recommended for                            surveillance. The colonoscopy date will be                            determined after pathology results from today's                            exam become available for review. Beverley Fiedler, MD 09/19/2022 8:53:55 AM This report has been signed electronically.

## 2022-09-19 NOTE — Progress Notes (Signed)
Called to room to assist during endoscopic procedure.  Patient ID and intended procedure confirmed with present staff. Received instructions for my participation in the procedure from the performing physician.  

## 2022-09-19 NOTE — Progress Notes (Signed)
Vitals-CW  Pt's states no medical or surgical changes since previsit or office visit. 

## 2022-09-19 NOTE — Progress Notes (Signed)
GASTROENTEROLOGY PROCEDURE H&P NOTE   Primary Care Physician: Mechele Claude, MD    Reason for Procedure:  History of colon polyps  Plan:    Colonoscopy  Patient is appropriate for endoscopic procedure(s) in the ambulatory ([facility]) setting.  The nature of the procedure, as well as the risks, benefits, and alternatives were carefully and thoroughly reviewed with the patient. Ample time for discussion and questions allowed. The patient understood, was satisfied, and agreed to proceed.     HPI: [name] [name] is a 57 y.o. male who presents for colonoscopy.  Medical history as below.  Tolerated the prep.  No recent chest pain or shortness of breath.  No abdominal pain today.  Past Medical History:  Diagnosis Date   Chronic pain    COVID-19 12/2018   Dental crown present    Diabetes mellitus without complication (HCC)    Diverticulosis    Hyperlipidemia    Hypertension    no meds   Internal hemorrhoids     Past Surgical History:  Procedure Laterality Date   CARPAL TUNNEL RELEASE Right 03/06/2014   Procedure: RIGHT CARPAL TUNNEL RELEASE;  Surgeon: Cindee Salt, MD;  Location: [facility];  Service: Orthopedics;  Laterality: Right;   COLONOSCOPY     GANGLION CYST EXCISION  2009   rt wrist   GANGLION CYST EXCISION Left 01/17/2013   Procedure: EXCISION VOLAR MYXOID CYST LEFT WRIST;  Surgeon: Wyn Forster., MD;  Location: [facility];  Service: Orthopedics;  Laterality: Left;   Right Bicep surgery  2008   Radial nerve and cyst removal-bicep rupture   Right shoulder surgery  2010   SHOULDER ARTHROSCOPY  98,09   right    Prior to Admission medications   Medication Sig Start Date End Date Taking? Authorizing Provider  ACCU-CHEK GUIDE test strip USE USE TO CHECK GLUCOSE BEFORE BREAKFAST AND 2 HOURS AFTER SUPPER 08/30/22  Yes Stacks, Broadus John, MD  Accu-Chek Softclix Lancets lancets USE TO CHECK BLOOD SUGAR BEFORE BREAKFAST AND 2 HOURS AFTER SUPPER  DAILY Dx E11.9 10/07/21  Yes Stacks, Broadus John, MD  blood glucose meter kit and supplies KIT Use up to four times daily as directed. (FOR ICD-10 : E11.9 04/20/21  Yes Stacks, Broadus John, MD  cholecalciferol (VITAMIN D) 1000 UNITS tablet Take 1,000 Units by mouth daily. 5000   Yes [provider]  lisinopril-hydrochlorothiazide (ZESTORETIC) 20-25 MG tablet Take 1 tablet by mouth daily. 02/23/22  Yes Mechele Claude, MD  metFORMIN (GLUCOPHAGE) 500 MG tablet Take 1 tablet (500 mg total) by mouth 2 (two) times daily with a meal. 02/23/22  Yes Stacks, Broadus John, MD  Multiple Vitamins-Minerals (MULTIVITAMIN WITH MINERALS) tablet Take 1 tablet by mouth daily.   Yes [provider]  pantoprazole (PROTONIX) 40 MG tablet Take 1 tablet (40 mg total) by mouth daily. For stomach 08/09/22  Yes Stacks, Broadus John, MD  rosuvastatin (CRESTOR) 5 MG tablet TAKE 1 TABLET BY MOUTH ON MONDAYS, WEDNESDAYS, AND FRIDAYS EVENINGS 02/23/22  Yes Stacks, Broadus John, MD  acetaminophen (TYLENOL) 500 MG tablet Take 500 mg by mouth every 6 (six) hours as needed.    [provider]  diclofenac (VOLTAREN) 75 MG EC tablet TAKE 1 TABLET BY MOUTH TWICE A DAY 08/12/21   Lenn Sink, DPM  fluticasone (FLONASE) 50 MCG/ACT nasal spray INSTILL 1 SPRAY INTO BOTH NOSTRILS DAILY 05/31/22   Mechele Claude, MD  pentoxifylline (TRENTAL) 400 MG CR tablet Take 1 tablet (400 mg total) by mouth 3 (three)  times daily with meals. For penile pain Patient not taking: Reported on 08/22/2022 02/23/22   Mechele Claude, MD    Current Outpatient Medications  Medication Sig Dispense Refill   ACCU-CHEK GUIDE test strip USE USE TO CHECK GLUCOSE BEFORE BREAKFAST AND 2 HOURS AFTER SUPPER 100 strip 12   Accu-Chek Softclix Lancets lancets USE TO CHECK BLOOD SUGAR BEFORE BREAKFAST AND 2 HOURS AFTER SUPPER DAILY Dx E11.9 200 each 3   blood glucose meter kit and supplies KIT Use up to four times daily as directed. (FOR ICD-10 : E11.9 1 each 11   cholecalciferol  (VITAMIN D) 1000 UNITS tablet Take 1,000 Units by mouth daily. 5000     lisinopril-hydrochlorothiazide (ZESTORETIC) 20-25 MG tablet Take 1 tablet by mouth daily. 90 tablet 2   metFORMIN (GLUCOPHAGE) 500 MG tablet Take 1 tablet (500 mg total) by mouth 2 (two) times daily with a meal. 180 tablet 2   Multiple Vitamins-Minerals (MULTIVITAMIN WITH MINERALS) tablet Take 1 tablet by mouth daily.     pantoprazole (PROTONIX) 40 MG tablet Take 1 tablet (40 mg total) by mouth daily. For stomach 90 tablet 3   rosuvastatin (CRESTOR) 5 MG tablet TAKE 1 TABLET BY MOUTH ON MONDAYS, WEDNESDAYS, AND FRIDAYS EVENINGS 39 tablet 2   acetaminophen (TYLENOL) 500 MG tablet Take 500 mg by mouth every 6 (six) hours as needed.     diclofenac (VOLTAREN) 75 MG EC tablet TAKE 1 TABLET BY MOUTH TWICE A DAY 50 tablet 2   fluticasone (FLONASE) 50 MCG/ACT nasal spray INSTILL 1 SPRAY INTO BOTH NOSTRILS DAILY 48 mL 1   pentoxifylline (TRENTAL) 400 MG CR tablet Take 1 tablet (400 mg total) by mouth 3 (three) times daily with meals. For penile pain (Patient not taking: Reported on 08/22/2022) 90 tablet 2   Current Facility-Administered Medications  Medication Dose Route Frequency Provider Last Rate Last Admin   0.9 %  sodium chloride infusion  500 mL Intravenous Once Brae Schaafsma, Carie Caddy, MD        Allergies as of 09/19/2022   (No Known Allergies)    Family History  Problem Relation Age of Onset   Heart disease Mother    Diabetes Mother    Stroke Mother    Kidney cancer Father    Colon cancer Neg Hx    Rectal cancer Neg Hx     Social History   Socioeconomic History   Marital status: Married    Spouse name: Not on file   Number of children: Not on file   Years of education: Not on file   Highest education level: Associate degree: occupational, Scientist, product/process development, or vocational program  Occupational History   Not on file  Tobacco Use   Smoking status: Never   Smokeless tobacco: Never  Vaping Use   Vaping Use: Never used   Substance and Sexual Activity   Alcohol use: No   Drug use: No   Sexual activity: Not on file  Other Topics Concern   Not on file  Social History Narrative   Not on file   Social Determinants of Health   Financial Resource Strain: Low Risk  (08/09/2022)   Overall Financial Resource Strain (CARDIA)    Difficulty of Paying Living Expenses: Not hard at all  Food Insecurity: No Food Insecurity (08/09/2022)   Hunger Vital Sign    Worried About Running Out of Food in the Last Year: Never true    Ran Out of Food in the Last Year: Never true  Transportation Needs:  No Transportation Needs (08/09/2022)   PRAPARE - Administrator, Civil Service (Medical): No    Lack of Transportation (Non-Medical): No  Physical Activity: Sufficiently Active (08/09/2022)   Exercise Vital Sign    Days of Exercise per Week: 6 days    Minutes of Exercise per Session: 70 min  Stress: No Stress Concern Present (08/09/2022)   [employer] of Occupational Health - Occupational Stress Questionnaire    Feeling of Stress : Not at all  Social Connections: Socially Integrated (08/09/2022)   Social Connection and Isolation Panel [NHANES]    Frequency of Communication with Friends and Family: Once a week    Frequency of Social Gatherings with Friends and Family: More than three times a week    Attends Religious Services: 1 to 4 times per year    Active Member of [employer] or Organizations: Yes    Attends Banker Meetings: 1 to 4 times per year    Marital Status: Married  Catering manager Violence: Not on file    Physical Exam: Vital signs in last 24 hours: @BP  138/62 (BP Location: Right Arm, Patient Position: Sitting, Cuff Size: Normal)   Pulse (!) 48   Temp 98.3 F (36.8 C) (Temporal)   Ht 5\' 10"  (1.778 m)   SpO2 99%   BMI 25.97 kg/m  GEN: NAD EYE: Sclerae anicteric ENT: MMM CV: Non-tachycardic Pulm: CTA b/l GI: Soft, NT/ND NEURO:  Alert & Oriented x 3   Erick Blinks, MD [facility]  09/19/2022 8:27 AM

## 2022-09-20 ENCOUNTER — Telehealth: Payer: Self-pay

## 2022-09-20 NOTE — Telephone Encounter (Signed)
  Follow up Call-     09/19/2022    7:49 AM 09/19/2022    7:44 AM  Call back number  Post procedure Call Back phone  # [phone]   Permission to leave phone message  Yes     Patient questions:  Do you have a fever, pain , or abdominal swelling? No. Pain Score  0 *  Have you tolerated food without any problems? Yes.    Have you been able to return to your normal activities? Yes.    Do you have any questions about your discharge instructions: Diet   No. Medications  No. Follow up visit  No.  Do you have questions or concerns about your Care? No.  Actions: * If pain score is 4 or above: No action needed, pain <4.

## 2022-09-21 ENCOUNTER — Encounter: Payer: Self-pay | Admitting: Internal Medicine

## 2022-11-22 ENCOUNTER — Other Ambulatory Visit: Payer: Self-pay | Admitting: Family Medicine

## 2022-12-13 NOTE — Telephone Encounter (Signed)
Noted  

## 2022-12-23 ENCOUNTER — Telehealth: Payer: 59 | Admitting: Nurse Practitioner

## 2022-12-23 ENCOUNTER — Encounter: Payer: Self-pay | Admitting: Nurse Practitioner

## 2022-12-23 DIAGNOSIS — J029 Acute pharyngitis, unspecified: Secondary | ICD-10-CM | POA: Diagnosis not present

## 2022-12-23 DIAGNOSIS — J069 Acute upper respiratory infection, unspecified: Secondary | ICD-10-CM | POA: Diagnosis not present

## 2022-12-23 LAB — VERITOR FLU A/B WAIVED
Influenza A: NEGATIVE
Influenza B: NEGATIVE

## 2022-12-23 LAB — RAPID STREP SCREEN (MED CTR MEBANE ONLY): Strep Gp A Ag, IA W/Reflex: NEGATIVE

## 2022-12-23 LAB — CULTURE, GROUP A STREP

## 2022-12-23 MED ORDER — HYDROCODONE BIT-HOMATROP MBR 5-1.5 MG/5ML PO SOLN: 5.0000 mL | Freq: Three times a day (TID) | ORAL | 0 refills | Status: DC | PRN

## 2022-12-23 NOTE — Patient Instructions (Signed)
Darlina Rumpf, thank you for joining Bennie Pierini, FNP for today's virtual visit.  While this provider is not your primary care provider (PCP), if your PCP is located in our provider database this encounter information will be shared with them immediately following your visit.   A [facility] MyChart account gives you access to today's visit and all your visits, tests, and labs performed at [facility] " click here if you don't have a [facility] MyChart account or go to [email or web address]  Consent: (Patient) [name] [name] provided verbal consent for this virtual visit at the beginning of the encounter.  Current Medications:  Current Outpatient Medications:    HYDROcodone bit-homatropine (HYCODAN) 5-1.5 MG/5ML syrup, Take 5 mLs by mouth every 8 (eight) hours as needed for cough., Disp: 120 mL, Rfl: 0   ACCU-CHEK GUIDE test strip, USE USE TO CHECK GLUCOSE BEFORE BREAKFAST AND 2 HOURS AFTER SUPPER, Disp: 100 strip, Rfl: 12   Accu-Chek Softclix Lancets lancets, USE TO CHECK BLOOD SUGAR BEFORE BREAKFAST AND 2 HOURS AFTER SUPPER DAILY Dx E11.9, Disp: 200 each, Rfl: 3   acetaminophen (TYLENOL) 500 MG tablet, Take 500 mg by mouth every 6 (six) hours as needed., Disp: , Rfl:    blood glucose meter kit and supplies KIT, Use up to four times daily as directed. (FOR ICD-10 : E11.9, Disp: 1 each, Rfl: 11   cholecalciferol (VITAMIN D) 1000 UNITS tablet, Take 1,000 Units by mouth daily. 5000, Disp: , Rfl:    diclofenac (VOLTAREN) 75 MG EC tablet, TAKE 1 TABLET BY MOUTH TWICE A DAY, Disp: 50 tablet, Rfl: 2   fluticasone (FLONASE) 50 MCG/ACT nasal spray, SPRAY 1 SPRAY INTO EACH NOSTRIL DAILY, Disp: 48 mL, Rfl: 1   lisinopril-hydrochlorothiazide (ZESTORETIC) 20-25 MG tablet, Take 1 tablet by mouth daily., Disp: 90 tablet, Rfl: 2   metFORMIN (GLUCOPHAGE) 500 MG tablet, Take 1 tablet (500 mg total) by mouth 2 (two) times daily with a meal., Disp: 180 tablet, Rfl: 2   Multiple  Vitamins-Minerals (MULTIVITAMIN WITH MINERALS) tablet, Take 1 tablet by mouth daily., Disp: , Rfl:    pantoprazole (PROTONIX) 40 MG tablet, Take 1 tablet (40 mg total) by mouth daily. For stomach, Disp: 90 tablet, Rfl: 3   pentoxifylline (TRENTAL) 400 MG CR tablet, Take 1 tablet (400 mg total) by mouth 3 (three) times daily with meals. For penile pain (Patient not taking: Reported on 08/22/2022), Disp: 90 tablet, Rfl: 2   rosuvastatin (CRESTOR) 5 MG tablet, TAKE 1 TABLET BY MOUTH ON MONDAYS, WEDNESDAYS, AND FRIDAYS EVENINGS, Disp: 39 tablet, Rfl: 2   Medications ordered in this encounter:  Meds ordered this encounter  Medications   HYDROcodone bit-homatropine (HYCODAN) 5-1.5 MG/5ML syrup    Sig: Take 5 mLs by mouth every 8 (eight) hours as needed for cough.    Dispense:  120 mL    Refill:  0    Order Specific Question:   Supervising Provider    Answer:   Arville Care A [[id number]]     *If you need refills on other medications prior to your next appointment, please contact your pharmacy*  Follow-Up: Call back or seek an in-person evaluation if the symptoms worsen or if the condition fails to improve as anticipated.  [facility] [phone]  Other Instructions 1. Take meds as prescribed 2. Use a cool mist humidifier especially during the winter months and when heat has been humid. 3. Use saline nose sprays frequently 4. Saline irrigations of the nose can  be very helpful if done frequently.  * 4X daily for 1 week*  * Use of a nettie pot can be helpful with this. Follow directions with this* 5. Drink plenty of fluids 6. Keep thermostat turn down low 7.For any cough or congestion- hycodan 8. For fever or aces or pains- take tylenol or ibuprofen appropriate for age and weight.  * for fevers greater than 101 orally you may alternate ibuprofen and tylenol every  3 hours.      If you have been instructed to have an in-person evaluation today at a local Urgent Care  facility, please use the link below. It will take you to a list of all of our available [facility], including address, phone number and hours of operation. Please do not delay care.  [facility]  If you or a family member do not have a primary care provider, use the link below to schedule a visit and establish care. When you choose a [facility] primary care physician or advanced practice provider, you gain a long-term partner in health. Find a Primary Care Provider  Learn more about [facility]'s in-office and virtual care options:  - Get Care Now

## 2022-12-23 NOTE — Progress Notes (Signed)
Virtual Visit Consent   [name] [name], you are scheduled for a virtual visit with Mary-Margaret Daphine Deutscher, FNP, a [facility] provider, today.     Just as with appointments in the office, your consent must be obtained to participate.  Your consent will be active for this visit and any virtual visit you may have with one of our providers in the next 365 days.     If you have a MyChart account, a copy of this consent can be sent to you electronically.  All virtual visits are billed to your insurance company just like a traditional visit in the office.    As this is a virtual visit, video technology does not allow for your provider to perform a traditional examination.  This may limit your provider's ability to fully assess your condition.  If your provider identifies any concerns that need to be evaluated in person or the need to arrange testing (such as labs, EKG, etc.), we will make arrangements to do so.     Although advances in technology are sophisticated, we cannot ensure that it will always work on either your end or our end.  If the connection with a video visit is poor, the visit may have to be switched to a telephone visit.  With either a video or telephone visit, we are not always able to ensure that we have a secure connection.     I need to obtain your verbal consent now.   Are you willing to proceed with your visit today? YES   [name] [name] has provided verbal consent on 12/23/2022 for a virtual visit (video or telephone).   Mary-Margaret Daphine Deutscher, FNP   Date: 12/23/2022 10:35 AM   Virtual Visit via Video Note   I, Mary-Margaret Daphine Deutscher, connected with [name] [name] ([id number], Apr 11, 1965) on 12/23/22 at  4:30 PM EDT by a video-enabled telemedicine application and verified that I am speaking with the correct person using two identifiers.  Location: Patient: Virtual Visit Location Patient: Home Provider: Virtual Visit Location Provider: Mobile   I discussed the limitations of  evaluation and management by telemedicine and the availability of in person appointments. The patient expressed understanding and agreed to proceed.    History of Present Illness: [name] [name] is a 57 y.o. who identifies as a male who was assigned male at birth, and is being seen today for flu like symptoms.  HPI: URI  This is a new problem. The current episode started yesterday. The problem has been gradually worsening. Maximum temperature: not sure. Associated symptoms include coughing, headaches, rhinorrhea and a sore throat. Pertinent negatives include no congestion. Associated symptoms comments: myalgia. He has tried NSAIDs (nyquil and dayquil) for the symptoms. The treatment provided mild relief.    Review of Systems  HENT:  Positive for rhinorrhea and sore throat. Negative for congestion.   Respiratory:  Positive for cough.   Neurological:  Positive for headaches.    Problems:  Patient Active Problem List   Diagnosis Date Noted   Flank pain 02/16/2021   Pure hypercholesterolemia 09/18/2018   Right upper quadrant abdominal pain 09/11/2017   Type 2 diabetes mellitus (HCC) 07/01/2013   Dyslipidemia associated with type 2 diabetes mellitus (HCC) 07/01/2013   Vitamin D deficiency 06/03/2013   Essential hypertension    RECTAL BLEEDING 01/22/2008    Allergies: No Known Allergies Medications:  Current Outpatient Medications:    ACCU-CHEK GUIDE test strip, USE USE TO CHECK GLUCOSE BEFORE BREAKFAST AND 2 HOURS  AFTER SUPPER, Disp: 100 strip, Rfl: 12   Accu-Chek Softclix Lancets lancets, USE TO CHECK BLOOD SUGAR BEFORE BREAKFAST AND 2 HOURS AFTER SUPPER DAILY Dx E11.9, Disp: 200 each, Rfl: 3   acetaminophen (TYLENOL) 500 MG tablet, Take 500 mg by mouth every 6 (six) hours as needed., Disp: , Rfl:    blood glucose meter kit and supplies KIT, Use up to four times daily as directed. (FOR ICD-10 : E11.9, Disp: 1 each, Rfl: 11   cholecalciferol (VITAMIN D) 1000 UNITS tablet, Take 1,000 Units  by mouth daily. 5000, Disp: , Rfl:    diclofenac (VOLTAREN) 75 MG EC tablet, TAKE 1 TABLET BY MOUTH TWICE A DAY, Disp: 50 tablet, Rfl: 2   fluticasone (FLONASE) 50 MCG/ACT nasal spray, SPRAY 1 SPRAY INTO EACH NOSTRIL DAILY, Disp: 48 mL, Rfl: 1   lisinopril-hydrochlorothiazide (ZESTORETIC) 20-25 MG tablet, Take 1 tablet by mouth daily., Disp: 90 tablet, Rfl: 2   metFORMIN (GLUCOPHAGE) 500 MG tablet, Take 1 tablet (500 mg total) by mouth 2 (two) times daily with a meal., Disp: 180 tablet, Rfl: 2   Multiple Vitamins-Minerals (MULTIVITAMIN WITH MINERALS) tablet, Take 1 tablet by mouth daily., Disp: , Rfl:    pantoprazole (PROTONIX) 40 MG tablet, Take 1 tablet (40 mg total) by mouth daily. For stomach, Disp: 90 tablet, Rfl: 3   pentoxifylline (TRENTAL) 400 MG CR tablet, Take 1 tablet (400 mg total) by mouth 3 (three) times daily with meals. For penile pain (Patient not taking: Reported on 08/22/2022), Disp: 90 tablet, Rfl: 2   rosuvastatin (CRESTOR) 5 MG tablet, TAKE 1 TABLET BY MOUTH ON MONDAYS, WEDNESDAYS, AND FRIDAYS EVENINGS, Disp: 39 tablet, Rfl: 2  Observations/Objective: Patient is well-developed, well-nourished in no acute distress.  Resting comfortably  at home.  Head is normocephalic, atraumatic.  No labored breathing.  Speech is clear and coherent with logical content.  Patient is alert and oriented at baseline.    Assessment and Plan:  [name] [name] in today with chief complaint of flu like   1. Sore throat  - Veritor Flu A/B Waived - Rapid Strep Screen ([facility] ONLY) - Novel Coronavirus, NAA ([facility])  2. URI with cough and congestion Covid test pending 1. Take meds as prescribed 2. Use a cool mist humidifier especially during the winter months and when heat has been humid. 3. Use saline nose sprays frequently 4. Saline irrigations of the nose can be very helpful if done frequently.  * 4X daily for 1 week*  * Use of a nettie pot can be helpful with this. Follow  directions with this* 5. Drink plenty of fluids 6. Keep thermostat turn down low 7.For any cough or congestion- hycodan 8. For fever or aces or pains- take tylenol or ibuprofen appropriate for age and weight.  * for fevers greater than 101 orally you may alternate ibuprofen and tylenol every  3 hours.    Meds ordered this encounter  Medications   HYDROcodone bit-homatropine (HYCODAN) 5-1.5 MG/5ML syrup    Sig: Take 5 mLs by mouth every 8 (eight) hours as needed for cough.    Dispense:  120 mL    Refill:  0    Order Specific Question:   Supervising Provider    Answer:   Arville Care A [[id number]]      Follow Up Instructions: I discussed the assessment and treatment plan with the patient. The patient was provided an opportunity to ask questions and all were answered. The patient agreed with the  plan and demonstrated an understanding of the instructions.  A copy of instructions were sent to the patient via MyChart.  The patient was advised to call back or seek an in-person evaluation if the symptoms worsen or if the condition fails to improve as anticipated.  Time:  I spent 12 minutes with the patient via telehealth technology discussing the above problems/concerns.   Had to wait for patient to come by fro fluid, strep and covid test. Mary-Margaret Daphine Deutscher, FNP

## 2022-12-24 LAB — NOVEL CORONAVIRUS, NAA: SARS-CoV-2, NAA: DETECTED — AB

## 2023-01-09 ENCOUNTER — Encounter: Payer: Self-pay | Admitting: Family Medicine

## 2023-01-09 ENCOUNTER — Ambulatory Visit: Payer: 59 | Admitting: Family Medicine

## 2023-01-09 VITALS — BP 128/70 | HR 57 | Ht 70.0 in | Wt 185.0 lb

## 2023-01-09 DIAGNOSIS — T148XXA Other injury of unspecified body region, initial encounter: Secondary | ICD-10-CM

## 2023-01-09 DIAGNOSIS — S91202A Unspecified open wound of left great toe with damage to nail, initial encounter: Secondary | ICD-10-CM

## 2023-01-09 MED ORDER — SULFAMETHOXAZOLE-TRIMETHOPRIM 800-160 MG PO TABS
1.0000 | ORAL_TABLET | Freq: Two times a day (BID) | ORAL | 0 refills | Status: DC
Start: 2023-01-09 — End: 2023-02-09

## 2023-01-09 NOTE — Progress Notes (Signed)
BP 128/70   Pulse (!) 57   Ht 5\' 10"  (1.778 m)   Wt 185 lb (83.9 kg)   SpO2 97%   BMI 26.54 kg/m    Subjective:   Patient ID: [name] [name], male    DOB: Aug 06, 1965, 58 y.o.   MRN: [id number]  HPI: SADIK PIASCIK is a 57 y.o. male presenting on 01/09/2023 for Toe Injury (Left great toe. Pierced with plugger for yard)   HPI Left great toe wound Patient was doing a hand plug her in his yard and yesterday afternoon accidentally plugged through his shoe and hit his left great toe near the toenail.  He says it is cracked a little bit has had some bleeding and he initially cleaned it out with peroxide and water and then has kept it covered and try to keep it clean.  He says it does hurt a little bit but is a lot better than yesterday and the swelling is gone down.  He denies any pain with range of motion of the toe.  He denies any fevers or chills redness or warmth.  Relevant past medical, surgical, family and social history reviewed and updated as indicated. Interim medical history since our last visit reviewed. Allergies and medications reviewed and updated.  Review of Systems  Constitutional:  Negative for chills and fever.  Respiratory:  Negative for shortness of breath and wheezing.   Cardiovascular:  Negative for chest pain and leg swelling.  Musculoskeletal:  Negative for back pain and gait problem.  Skin:  Positive for wound (Small superficial wound near the edge of the nailbed on his left great toe on the medial aspect). Negative for color change and rash.  All other systems reviewed and are negative.   Per HPI unless specifically indicated above   Allergies as of 01/09/2023   No Known Allergies      Medication List        Accurate as of January 09, 2023  4:08 PM. If you have any questions, ask your nurse or doctor.          Accu-Chek Guide test strip Generic drug: glucose blood USE USE TO CHECK GLUCOSE BEFORE BREAKFAST AND 2 HOURS AFTER SUPPER   Accu-Chek  Softclix Lancets lancets USE TO CHECK BLOOD SUGAR BEFORE BREAKFAST AND 2 HOURS AFTER SUPPER DAILY Dx E11.9   acetaminophen 500 MG tablet Commonly known as: TYLENOL Take 500 mg by mouth every 6 (six) hours as needed.   blood glucose meter kit and supplies Kit Use up to four times daily as directed. (FOR ICD-10 : E11.9   cholecalciferol 1000 units tablet Commonly known as: VITAMIN D Take 1,000 Units by mouth daily. 5000   diclofenac 75 MG EC tablet Commonly known as: VOLTAREN TAKE 1 TABLET BY MOUTH TWICE A DAY   fluticasone 50 MCG/ACT nasal spray Commonly known as: FLONASE SPRAY 1 SPRAY INTO EACH NOSTRIL DAILY   HYDROcodone bit-homatropine 5-1.5 MG/5ML syrup Commonly known as: HYCODAN Take 5 mLs by mouth every 8 (eight) hours as needed for cough.   lisinopril-hydrochlorothiazide 20-25 MG tablet Commonly known as: ZESTORETIC Take 1 tablet by mouth daily.   metFORMIN 500 MG tablet Commonly known as: GLUCOPHAGE Take 1 tablet (500 mg total) by mouth 2 (two) times daily with a meal.   multivitamin with minerals tablet Take 1 tablet by mouth daily.   pantoprazole 40 MG tablet Commonly known as: PROTONIX Take 1 tablet (40 mg total) by mouth daily. For stomach  pentoxifylline 400 MG CR tablet Commonly known as: TRENTAL Take 1 tablet (400 mg total) by mouth 3 (three) times daily with meals. For penile pain   rosuvastatin 5 MG tablet Commonly known as: CRESTOR TAKE 1 TABLET BY MOUTH ON MONDAYS, WEDNESDAYS, AND FRIDAYS EVENINGS   sulfamethoxazole-trimethoprim 800-160 MG tablet Commonly known as: BACTRIM DS Take 1 tablet by mouth 2 (two) times daily. Started by: Elige Radon Damien Batty         Objective:   BP 128/70   Pulse (!) 57   Ht 5\' 10"  (1.778 m)   Wt 185 lb (83.9 kg)   SpO2 97%   BMI 26.54 kg/m   Wt Readings from Last 3 Encounters:  01/09/23 185 lb (83.9 kg)  08/22/22 181 lb (82.1 kg)  08/09/22 178 lb 12.8 oz (81.1 kg)    Physical Exam Vitals and nursing  note reviewed.  Constitutional:      Appearance: Normal appearance.  Skin:    Findings: Wound (Small superficial puncture wound near the proximal/medial aspect of the nailbed of his left great toenail.  No erythema) present.  Neurological:     Mental Status: He is alert.    No pain in the bone itself that would be indicative of fracture or deep puncture.   Assessment & Plan:   Problem List Items Addressed This Visit   None Visit Diagnoses     Puncture wound    -  Primary   Relevant Medications   sulfamethoxazole-trimethoprim (BACTRIM DS) 800-160 MG tablet       Puncture wound of left great toe near the nailbed, concern for possible infection, he is up-to-date on his tetanus but will send an antibiotic in for him.  We wrapped with Coban daily and try to air out in the evenings  Clean with simple soap and water Follow up plan: Return if symptoms worsen or fail to improve.  Counseling provided for all of the vaccine components No orders of the defined types were placed in this encounter.   Arville Care, MD [facility] 01/09/2023, 4:08 PM

## 2023-01-10 ENCOUNTER — Other Ambulatory Visit: Payer: Self-pay | Admitting: Family Medicine

## 2023-01-10 DIAGNOSIS — I10 Essential (primary) hypertension: Secondary | ICD-10-CM

## 2023-01-10 DIAGNOSIS — E7800 Pure hypercholesterolemia, unspecified: Secondary | ICD-10-CM

## 2023-02-09 ENCOUNTER — Encounter: Payer: Self-pay | Admitting: Family Medicine

## 2023-02-09 ENCOUNTER — Ambulatory Visit: Payer: 59 | Admitting: Family Medicine

## 2023-02-09 VITALS — BP 126/73 | HR 66 | Temp 97.5°F | Ht 70.0 in | Wt 181.4 lb

## 2023-02-09 DIAGNOSIS — Z0001 Encounter for general adult medical examination with abnormal findings: Secondary | ICD-10-CM

## 2023-02-09 DIAGNOSIS — E119 Type 2 diabetes mellitus without complications: Secondary | ICD-10-CM

## 2023-02-09 DIAGNOSIS — E7800 Pure hypercholesterolemia, unspecified: Secondary | ICD-10-CM | POA: Diagnosis not present

## 2023-02-09 DIAGNOSIS — I10 Essential (primary) hypertension: Secondary | ICD-10-CM

## 2023-02-09 DIAGNOSIS — E1169 Type 2 diabetes mellitus with other specified complication: Secondary | ICD-10-CM

## 2023-02-09 DIAGNOSIS — E785 Hyperlipidemia, unspecified: Secondary | ICD-10-CM

## 2023-02-09 DIAGNOSIS — Z7984 Long term (current) use of oral hypoglycemic drugs: Secondary | ICD-10-CM

## 2023-02-09 DIAGNOSIS — Z125 Encounter for screening for malignant neoplasm of prostate: Secondary | ICD-10-CM

## 2023-02-09 LAB — CBC WITH DIFFERENTIAL/PLATELET
Basophils Absolute: 0.1 10*3/uL (ref 0.0–0.2)
Basos: 1 %
EOS (ABSOLUTE): 0.3 10*3/uL (ref 0.0–0.4)
Eos: 5 %
Hematocrit: 43.1 % (ref 37.5–51.0)
Hemoglobin: 14.5 g/dL (ref 13.0–17.7)
Immature Grans (Abs): 0 10*3/uL (ref 0.0–0.1)
Immature Granulocytes: 0 %
Lymphocytes Absolute: 1.9 10*3/uL (ref 0.7–3.1)
Lymphs: 34 %
MCH: 30 pg (ref 26.6–33.0)
MCHC: 33.6 g/dL (ref 31.5–35.7)
MCV: 89 fL (ref 79–97)
Monocytes Absolute: 0.6 10*3/uL (ref 0.1–0.9)
Monocytes: 11 %
Neutrophils Absolute: 2.7 10*3/uL (ref 1.4–7.0)
Neutrophils: 49 %
Platelets: 383 10*3/uL (ref 150–450)
RBC: 4.83 x10E6/uL (ref 4.14–5.80)
RDW: 12.7 % (ref 11.6–15.4)
WBC: 5.6 10*3/uL (ref 3.4–10.8)

## 2023-02-09 LAB — CMP14+EGFR
ALT: 18 [IU]/L (ref 0–44)
AST: 18 [IU]/L (ref 0–40)
Albumin: 4.6 g/dL (ref 3.8–4.9)
Alkaline Phosphatase: 47 [IU]/L (ref 44–121)
BUN/Creatinine Ratio: 19 (ref 9–20)
BUN: 17 mg/dL (ref 6–24)
Bilirubin Total: 0.7 mg/dL (ref 0.0–1.2)
CO2: 23 mmol/L (ref 20–29)
Calcium: 9.9 mg/dL (ref 8.7–10.2)
Chloride: 99 mmol/L (ref 96–106)
Creatinine, Ser: 0.91 mg/dL (ref 0.76–1.27)
Globulin, Total: 2.4 g/dL (ref 1.5–4.5)
Glucose: 111 mg/dL — ABNORMAL HIGH (ref 70–99)
Potassium: 4.5 mmol/L (ref 3.5–5.2)
Sodium: 136 mmol/L (ref 134–144)
Total Protein: 7 g/dL (ref 6.0–8.5)
eGFR: 98 mL/min/{1.73_m2} (ref >59)

## 2023-02-09 LAB — URINALYSIS
Bilirubin, UA: NEGATIVE
Glucose, UA: NEGATIVE
Ketones, UA: NEGATIVE
Leukocytes,UA: NEGATIVE
Nitrite, UA: NEGATIVE
Protein,UA: NEGATIVE
RBC, UA: NEGATIVE
Specific Gravity, UA: 1.02 (ref 1.005–1.030)
Urobilinogen, Ur: 0.2 mg/dL (ref 0.2–1.0)
pH, UA: 7 (ref 5.0–7.5)

## 2023-02-09 LAB — LIPID PANEL
Chol/HDL Ratio: 3.2 ratio (ref 0.0–5.0)
Cholesterol, Total: 152 mg/dL (ref 100–199)
HDL: 47 mg/dL (ref >39)
LDL Chol Calc (NIH): 91 mg/dL (ref 0–99)
Triglycerides: 71 mg/dL (ref 0–149)
VLDL Cholesterol Cal: 14 mg/dL (ref 5–40)

## 2023-02-09 LAB — BAYER DCA HB A1C WAIVED: HB A1C (BAYER DCA - WAIVED): 6.1 % — ABNORMAL HIGH (ref 4.8–5.6)

## 2023-02-09 MED ORDER — PENTOXIFYLLINE ER 400 MG PO TBCR: 400.0000 mg | EXTENDED_RELEASE_TABLET | Freq: Three times a day (TID) | ORAL | 3 refills | Status: DC

## 2023-02-09 MED ORDER — METFORMIN HCL 500 MG PO TABS
500.0000 mg | ORAL_TABLET | Freq: Two times a day (BID) | ORAL | 3 refills | Status: DC
Start: 2023-02-09 — End: 2024-02-02

## 2023-02-09 MED ORDER — ROSUVASTATIN CALCIUM 5 MG PO TABS
ORAL_TABLET | ORAL | 3 refills | Status: DC
Start: 2023-02-09 — End: 2024-02-08

## 2023-02-09 MED ORDER — LISINOPRIL-HYDROCHLOROTHIAZIDE 20-25 MG PO TABS
1.0000 | ORAL_TABLET | Freq: Every day | ORAL | 3 refills | Status: DC
Start: 2023-02-09 — End: 2024-02-08

## 2023-02-09 NOTE — Progress Notes (Signed)
Subjective:  Patient ID: [name] [name], male    DOB: [birth year]  Age: 57 y.o. MRN: [id number]  CC: Annual Exam   HPI [name] presents for Annual Physical   presents for  follow-up of hypertension. Patient has no history of headache chest pain or shortness of breath or recent cough. Patient also denies symptoms of TIA such as focal numbness or weakness. Patient denies side effects from medication. States taking it regularly.   in for follow-up of elevated cholesterol. Doing well without complaints on current medication. Denies side effects of statin including myalgia and arthralgia and nausea. Currently no chest pain, shortness of breath or other cardiovascular related symptoms noted.  presents forFollow-up of diabetes. Patient denies symptoms such as polyuria, polydipsia, excessive hunger, nausea No significant hypoglycemic spells noted. Medications reviewed. Pt reports taking them regularly without complication/adverse reaction being reported today.  Lab Results  Component Value Date   HGBA1C 6.1 (H) 02/09/2023   HGBA1C 6.1 (H) 08/09/2022   HGBA1C 6.3 (H) 02/23/2022         02/09/2023    8:09 AM 01/09/2023    3:53 PM 08/09/2022   10:57 AM  Depression screen PHQ 2/9  Decreased Interest 0 0 0  Down, Depressed, Hopeless 0 0 0  PHQ - 2 Score 0 0 0  Altered sleeping  0   Tired, decreased energy  0   Change in appetite  0   Feeling bad or failure about yourself   0   Trouble concentrating  0   Moving slowly or fidgety/restless  0   Suicidal thoughts  0   PHQ-9 Score  0   Difficult doing work/chores  Not difficult at all     History [name] has a past medical history of Chronic pain, COVID-19 (12/2018), Dental crown present, Diabetes mellitus without complication (HCC), Diverticulosis, Hyperlipidemia, Hypertension, and Internal hemorrhoids.   He has a past surgical history that includes Right shoulder surgery (2010); Right Bicep surgery (2008); Ganglion cyst excision (2009);  Shoulder arthroscopy (98,09); Colonoscopy; Ganglion cyst excision (Left, 01/17/2013); and Carpal tunnel release (Right, 03/06/2014).   His family history includes Diabetes in his mother; Heart disease in his mother; Kidney cancer in his father; Stroke in his mother.He reports that he has never smoked. He has never used smokeless tobacco. He reports that he does not drink alcohol and does not use drugs.    ROS Review of Systems  Constitutional:  Negative for activity change, fatigue and unexpected weight change.  HENT:  Negative for congestion, ear pain, hearing loss, postnasal drip and trouble swallowing.   Eyes:  Negative for pain and visual disturbance.  Respiratory:  Negative for cough, chest tightness and shortness of breath.   Cardiovascular:  Negative for chest pain, palpitations and leg swelling.  Gastrointestinal:  Negative for abdominal distention, abdominal pain, blood in stool, constipation, diarrhea, nausea and vomiting.  Endocrine: Negative for cold intolerance, heat intolerance and polydipsia.  Genitourinary:  Negative for difficulty urinating, dysuria, flank pain, frequency and urgency.  Musculoskeletal:  Negative for arthralgias and joint swelling.  Skin:  Negative for color change, rash and wound.  Neurological:  Negative for dizziness, syncope, speech difficulty, weakness, light-headedness, numbness and headaches.  Hematological:  Does not bruise/bleed easily.  Psychiatric/Behavioral:  Negative for confusion, decreased concentration, dysphoric mood and sleep disturbance. The patient is not nervous/anxious.     Objective:  BP 126/73   Pulse 66   Temp (!) 97.5 F (36.4 C)   Ht 5'  10" (1.778 m)   Wt 181 lb 6.4 oz (82.3 kg)   SpO2 96%   BMI 26.03 kg/m   BP Readings from Last 3 Encounters:  02/09/23 126/73  01/09/23 128/70  09/19/22 114/67    Wt Readings from Last 3 Encounters:  02/09/23 181 lb 6.4 oz (82.3 kg)  01/09/23 185 lb (83.9 kg)  08/22/22 181 lb (82.1  kg)     Physical Exam Constitutional:      Appearance: He is well-developed.  HENT:     Head: Normocephalic and atraumatic.  Eyes:     Pupils: Pupils are equal, round, and reactive to light.  Neck:     Thyroid: No thyromegaly.     Trachea: No tracheal deviation.  Cardiovascular:     Rate and Rhythm: Normal rate and regular rhythm.     Heart sounds: Normal heart sounds. No murmur heard.    No friction rub. No gallop.  Pulmonary:     Breath sounds: Normal breath sounds. No wheezing or rales.  Abdominal:     General: Bowel sounds are normal. There is no distension.     Palpations: Abdomen is soft. There is no mass.     Tenderness: There is no abdominal tenderness.     Hernia: There is no hernia in the left inguinal area.  Genitourinary:    Penis: Normal.      Testes: Normal.  Musculoskeletal:        General: Normal range of motion.     Cervical back: Normal range of motion.  Lymphadenopathy:     Cervical: No cervical adenopathy.  Skin:    General: Skin is warm and dry.  Neurological:     Mental Status: He is alert and oriented to person, place, and time.       Assessment & Plan:   [name] was seen today for annual exam.  Diagnoses and all orders for this visit:  Type 2 diabetes mellitus without complication, without long-term current use of insulin (HCC) -     Bayer DCA Hb A1c Waived -     metFORMIN (GLUCOPHAGE) 500 MG tablet; Take 1 tablet (500 mg total) by mouth 2 (two) times daily with a meal.  Essential hypertension -     CMP14+EGFR -     CBC with Differential/Platelet -     lisinopril-hydrochlorothiazide (ZESTORETIC) 20-25 MG tablet; Take 1 tablet by mouth daily. -     Urinalysis  Dyslipidemia associated with type 2 diabetes mellitus (HCC) -     Lipid panel  Pure hypercholesterolemia -     rosuvastatin (CRESTOR) 5 MG tablet; TAKE 1 TABLET BY MOUTH ON MONDAYS, WEDNESDAYS, AND FRIDAYS EVENINGS  Screening for prostate cancer -     PSA, total and  free  Other orders -     pentoxifylline (TRENTAL) 400 MG CR tablet; Take 1 tablet (400 mg total) by mouth 3 (three) times daily with meals. For penile pain       I have discontinued Charod D. Reifschneider's HYDROcodone bit-homatropine and sulfamethoxazole-trimethoprim. I have also changed his lisinopril-hydrochlorothiazide. Additionally, I am having him maintain his multivitamin with minerals, cholecalciferol, acetaminophen, blood glucose meter kit and supplies, diclofenac, Accu-Chek Softclix Lancets, pantoprazole, Accu-Chek Guide, fluticasone, metFORMIN, pentoxifylline, and rosuvastatin.  Allergies as of 02/09/2023   No Known Allergies      Medication List        Accurate as of February 09, 2023 11:59 PM. If you have any questions, ask your nurse or doctor.  STOP taking these medications    HYDROcodone bit-homatropine 5-1.5 MG/5ML syrup Commonly known as: HYCODAN Stopped by: Dewitte Vannice   sulfamethoxazole-trimethoprim 800-160 MG tablet Commonly known as: BACTRIM DS Stopped by: Custer Pimenta       TAKE these medications    Accu-Chek Guide test strip Generic drug: glucose blood USE USE TO CHECK GLUCOSE BEFORE BREAKFAST AND 2 HOURS AFTER SUPPER   Accu-Chek Softclix Lancets lancets USE TO CHECK BLOOD SUGAR BEFORE BREAKFAST AND 2 HOURS AFTER SUPPER DAILY Dx E11.9   acetaminophen 500 MG tablet Commonly known as: TYLENOL Take 500 mg by mouth every 6 (six) hours as needed.   blood glucose meter kit and supplies Kit Use up to four times daily as directed. (FOR ICD-10 : E11.9   cholecalciferol 1000 units tablet Commonly known as: VITAMIN D Take 1,000 Units by mouth daily. 5000   diclofenac 75 MG EC tablet Commonly known as: VOLTAREN TAKE 1 TABLET BY MOUTH TWICE A DAY   fluticasone 50 MCG/ACT nasal spray Commonly known as: FLONASE SPRAY 1 SPRAY INTO EACH NOSTRIL DAILY   lisinopril-hydrochlorothiazide 20-25 MG tablet Commonly known as: ZESTORETIC Take 1  tablet by mouth daily.   metFORMIN 500 MG tablet Commonly known as: GLUCOPHAGE Take 1 tablet (500 mg total) by mouth 2 (two) times daily with a meal.   multivitamin with minerals tablet Take 1 tablet by mouth daily.   pantoprazole 40 MG tablet Commonly known as: PROTONIX Take 1 tablet (40 mg total) by mouth daily. For stomach   pentoxifylline 400 MG CR tablet Commonly known as: TRENTAL Take 1 tablet (400 mg total) by mouth 3 (three) times daily with meals. For penile pain   rosuvastatin 5 MG tablet Commonly known as: CRESTOR TAKE 1 TABLET BY MOUTH ON MONDAYS, WEDNESDAYS, AND FRIDAYS EVENINGS         Follow-up: Return in about 6 months (around 08/09/2023).  Mechele Claude, M.D.

## 2023-02-12 ENCOUNTER — Encounter: Payer: Self-pay | Admitting: Family Medicine

## 2023-02-12 NOTE — Progress Notes (Signed)
Hello [name] [name],  Your lab result is normal and/or stable.Some minor variations that are not significant are commonly marked abnormal, but do not represent any medical problem for you.  Best regards, Shauntae Reitman, M.D.

## 2023-02-23 ENCOUNTER — Ambulatory Visit: Payer: 59 | Admitting: *Deleted

## 2023-02-23 DIAGNOSIS — E119 Type 2 diabetes mellitus without complications: Secondary | ICD-10-CM | POA: Diagnosis not present

## 2023-02-23 LAB — HM DIABETES EYE EXAM

## 2023-02-24 NOTE — Progress Notes (Signed)
[name] [name] arrived 02/24/2023 and has given verbal consent to obtain images and complete their overdue diabetic retinal screening.  The images have been sent to an ophthalmologist or optometrist for review and interpretation.  Results will be sent back to Mechele Claude, MD for review.  Patient has been informed they will be contacted when we receive the results via telephone or MyChart

## 2023-05-15 ENCOUNTER — Ambulatory Visit: Payer: 59 | Admitting: Family Medicine

## 2023-05-16 ENCOUNTER — Other Ambulatory Visit: Payer: Self-pay | Admitting: Family Medicine

## 2023-05-19 ENCOUNTER — Ambulatory Visit (INDEPENDENT_AMBULATORY_CARE_PROVIDER_SITE_OTHER): Payer: 59 | Admitting: Family Medicine

## 2023-05-19 ENCOUNTER — Ambulatory Visit: Payer: 59 | Admitting: Family Medicine

## 2023-05-19 ENCOUNTER — Encounter: Payer: Self-pay | Admitting: Family Medicine

## 2023-05-19 VITALS — BP 128/78 | HR 65 | Ht 70.0 in | Wt 181.0 lb

## 2023-05-19 DIAGNOSIS — R058 Other specified cough: Secondary | ICD-10-CM | POA: Diagnosis not present

## 2023-05-19 MED ORDER — GUAIFENESIN-CODEINE 100-10 MG/5ML PO SOLN: 5.0000 mL | Freq: Three times a day (TID) | ORAL | 0 refills | Status: DC | PRN

## 2023-05-19 NOTE — Progress Notes (Signed)
BP 128/78   Pulse 65   Ht 5\' 10"  (1.778 m)   Wt 181 lb (82.1 kg)   SpO2 97%   BMI 25.97 kg/m    Subjective:   Patient ID: [name] [name], male    DOB: [birth year], 58 y.o.   MRN: [id number]  HPI: [name] [name] is a 58 y.o. male presenting on 05/19/2023 for Cough   HPI Patient is coming in today with cough and congestion and it has been bothering him over the past couple weeks.  He says that he started with an illness with cough and fever a couple weeks ago but then what is left is the cough.  He says he will do some days where the cough will be better but then returned.  He has tried some NyQuil and it did help a little bit but did not get him through the night.  He has been trying gum and lozenges and increase liquids to try and help with it.  He did have some cough syrup with codeine leftover and he feels like that did help some as well but he wants to get to where he can get over the cough.  He denies any shortness of breath or wheezing or fevers or chills anymore.  He denies any sinus pressure or drainage.  Relevant past medical, surgical, family and social history reviewed and updated as indicated. Interim medical history since our last visit reviewed. Allergies and medications reviewed and updated.  Review of Systems  Constitutional:  Negative for chills and fever.  HENT:  Positive for congestion and postnasal drip. Negative for ear discharge, ear pain, rhinorrhea, sinus pressure, sneezing, sore throat and voice change.   Eyes:  Negative for pain, discharge, redness and visual disturbance.  Respiratory:  Positive for cough. Negative for shortness of breath and wheezing.   Cardiovascular:  Negative for chest pain and leg swelling.  Musculoskeletal:  Negative for myalgias.  Skin:  Negative for rash.  All other systems reviewed and are negative.   Per HPI unless specifically indicated above   Allergies as of 05/19/2023   No Known Allergies      Medication List         Accurate as of May 19, 2023 11:17 AM. If you have any questions, ask your nurse or doctor.          Accu-Chek Guide test strip Generic drug: glucose blood USE USE TO CHECK GLUCOSE BEFORE BREAKFAST AND 2 HOURS AFTER SUPPER   Accu-Chek Softclix Lancets lancets USE TO CHECK BLOOD SUGAR BEFORE BREAKFAST AND 2 HOURS AFTER SUPPER DAILY Dx E11.9   acetaminophen 500 MG tablet Commonly known as: TYLENOL Take 500 mg by mouth every 6 (six) hours as needed.   blood glucose meter kit and supplies Kit Use up to four times daily as directed. (FOR ICD-10 : E11.9   cholecalciferol 1000 units tablet Commonly known as: VITAMIN D Take 1,000 Units by mouth daily. 5000   diclofenac 75 MG EC tablet Commonly known as: VOLTAREN TAKE 1 TABLET BY MOUTH TWICE A DAY   fluticasone 50 MCG/ACT nasal spray Commonly known as: FLONASE SPRAY 1 SPRAY INTO EACH NOSTRIL DAILY   guaiFENesin-codeine 100-10 MG/5ML syrup Take 5 mLs by mouth 3 (three) times daily as needed for cough. Started by: Elige Radon Julus Kelley   lisinopril-hydrochlorothiazide 20-25 MG tablet Commonly known as: ZESTORETIC Take 1 tablet by mouth daily.   metFORMIN 500 MG tablet Commonly known as: GLUCOPHAGE Take 1 tablet (500 mg  total) by mouth 2 (two) times daily with a meal.   multivitamin with minerals tablet Take 1 tablet by mouth daily.   pantoprazole 40 MG tablet Commonly known as: PROTONIX Take 1 tablet (40 mg total) by mouth daily. For stomach   pentoxifylline 400 MG CR tablet Commonly known as: TRENTAL Take 1 tablet (400 mg total) by mouth 3 (three) times daily with meals. For penile pain   rosuvastatin 5 MG tablet Commonly known as: CRESTOR TAKE 1 TABLET BY MOUTH ON MONDAYS, WEDNESDAYS, AND FRIDAYS EVENINGS         Objective:   BP 128/78   Pulse 65   Ht 5\' 10"  (1.778 m)   Wt 181 lb (82.1 kg)   SpO2 97%   BMI 25.97 kg/m   Wt Readings from Last 3 Encounters:  05/19/23 181 lb (82.1 kg)  02/09/23 181 lb  6.4 oz (82.3 kg)  01/09/23 185 lb (83.9 kg)    Physical Exam Vitals and nursing note reviewed.  Constitutional:      General: He is not in acute distress.    Appearance: He is well-developed. He is not diaphoretic.  HENT:     Right Ear: Tympanic membrane, ear canal and external ear normal.     Left Ear: Tympanic membrane, ear canal and external ear normal.     Nose: Mucosal edema present. No rhinorrhea.     Right Sinus: Maxillary sinus tenderness present. No frontal sinus tenderness.     Left Sinus: Maxillary sinus tenderness present. No frontal sinus tenderness.     Mouth/Throat:     Pharynx: Uvula midline. No oropharyngeal exudate or posterior oropharyngeal erythema.     Tonsils: No tonsillar abscesses.  Eyes:     General: No scleral icterus.    Conjunctiva/sclera: Conjunctivae normal.  Neck:     Thyroid: No thyromegaly.  Cardiovascular:     Rate and Rhythm: Normal rate and regular rhythm.     Heart sounds: Normal heart sounds. No murmur heard. Pulmonary:     Effort: Pulmonary effort is normal. No respiratory distress.     Breath sounds: Normal breath sounds. No wheezing, rhonchi or rales.  Musculoskeletal:        General: Normal range of motion.     Cervical back: Neck supple.  Lymphadenopathy:     Cervical: No cervical adenopathy.  Skin:    General: Skin is warm and dry.     Findings: No rash.  Neurological:     Mental Status: He is alert and oriented to person, place, and time.     Coordination: Coordination normal.  Psychiatric:        Behavior: Behavior normal.       Assessment & Plan:   Problem List Items Addressed This Visit   None Visit Diagnoses       Post-viral cough syndrome    -  Primary   Relevant Medications   guaiFENesin-codeine 100-10 MG/5ML syrup       Sounds like postviral cough, recommended that he use a cough syrup with codeine and that he do Benadryl every night and salt water gargles throughout the day. Follow up plan: Return if  symptoms worsen or fail to improve.  Counseling provided for all of the vaccine components No orders of the defined types were placed in this encounter.   Arville Care, MD [facility] 05/19/2023, 11:17 AM

## 2023-07-14 IMAGING — MR MR ANKLE*L* W/O CM
6 series · 40 of 40 positions shown · non-contrast
Comparison: Left ankle x-rays dated March 11, 2021.

CLINICAL DATA: Lateral and posterior ankle pain for 4 months.

EXAM:
MRI OF THE LEFT ANKLE WITHOUT CONTRAST
TECHNIQUE: Multiplanar, multisequence MR imaging of the ankle was performed. No
intravenous contrast was administered.

[Series 3: T2 fat-sat · axial · 3.0mm · 0.50mm/px · z∈[-96,+25]mm · 8 of 32 slices shown (1 of 2)]
[im 1/32]
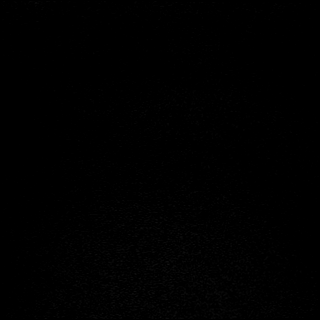
[im 5/32]
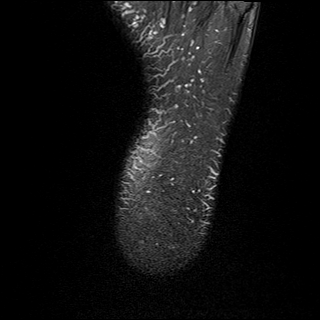
[im 9/32]
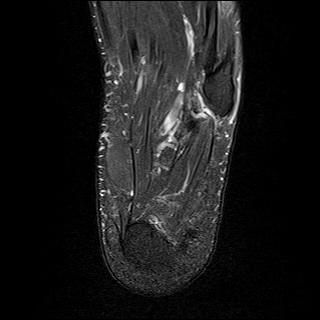
[im 14/32]
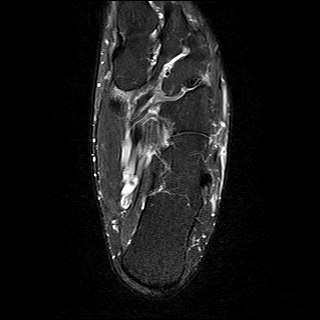
[im 18/32]
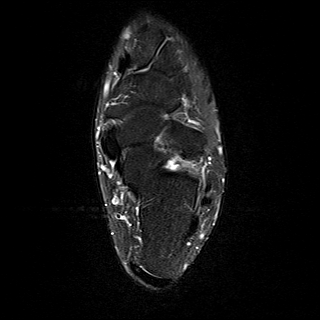
[im 23/32]
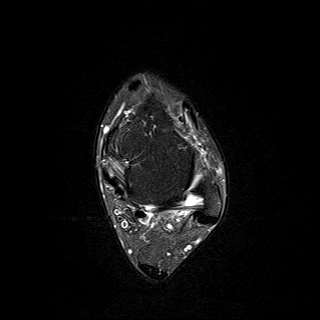
[im 27/32]
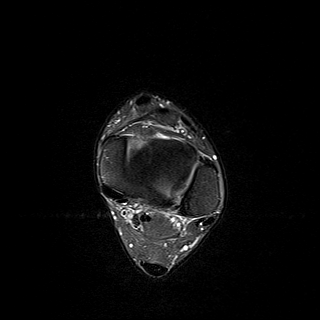
[im 32/32]
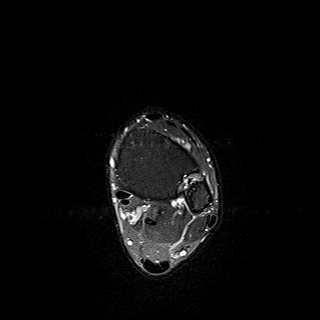

[Series 4: PD fat-sat · axial · 3.0mm · 0.42mm/px · z∈[-96,+25]mm · 7 of 32 slices shown]
[im 1/32]
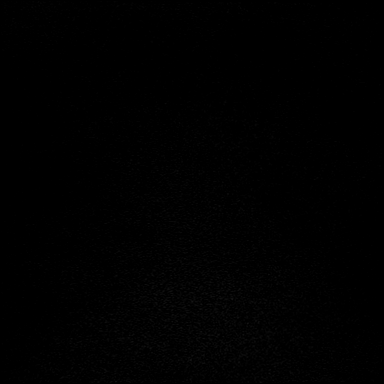
[im 6/32]
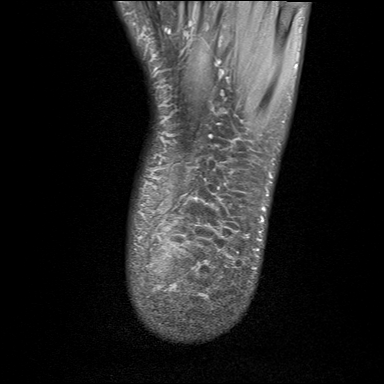
[im 11/32]
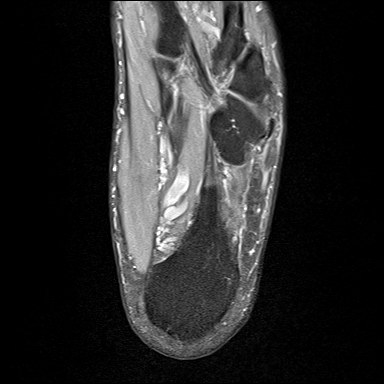
[im 16/32]
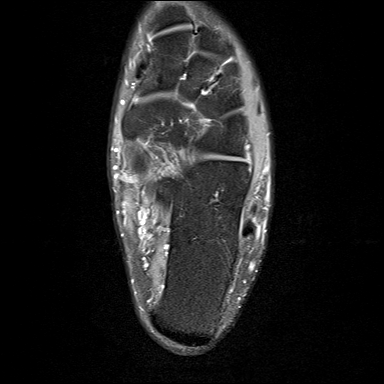
[im 21/32]
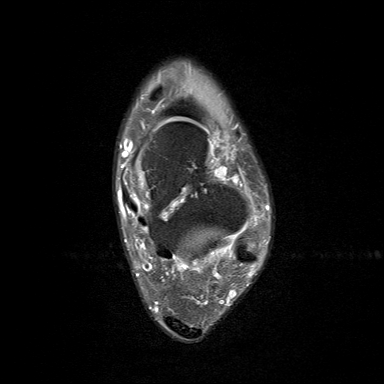
[im 26/32]
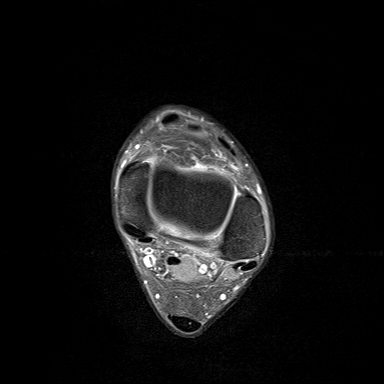
[im 32/32]
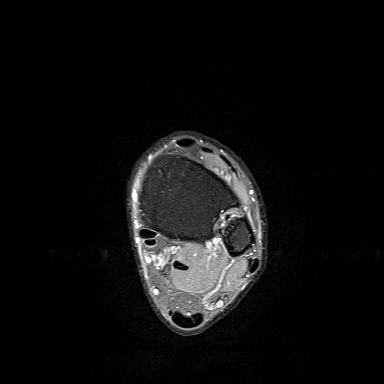

[Series 5: T1 · axial · 3.0mm · 0.50mm/px · z∈[-96,+25]mm · 7 of 32 slices shown (1 of 2)]
[im 1/32]
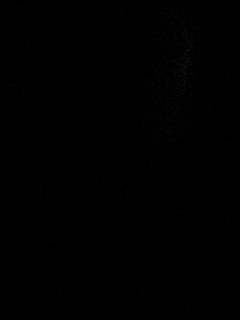
[im 6/32]
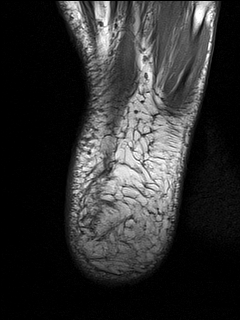
[im 11/32]
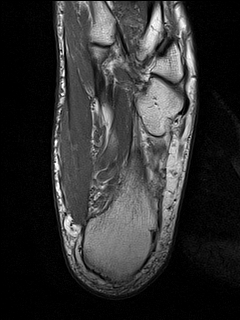
[im 16/32]
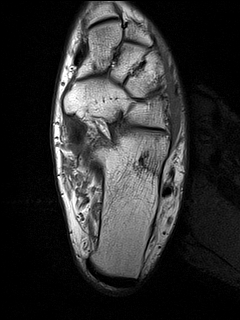
[im 21/32]
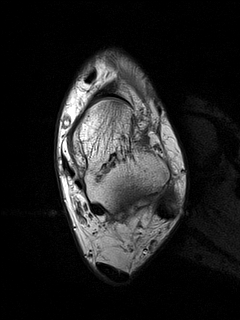
[im 26/32]
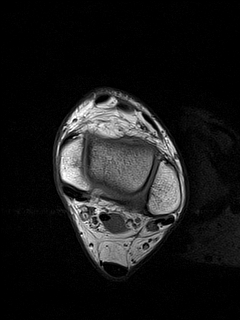
[im 32/32]
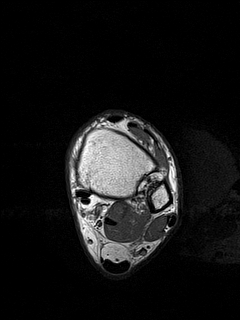

[Series 6: T1 · sagittal · 4.0mm · 0.56mm/px · 5 of 20 slices shown (2 of 2)]
[im 1/20]
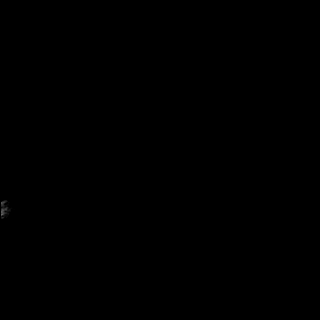
[im 5/20]
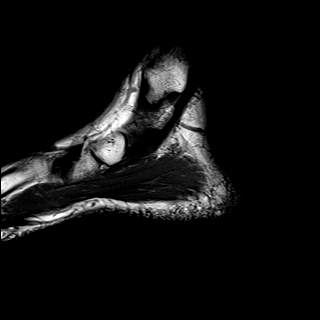
[im 10/20]
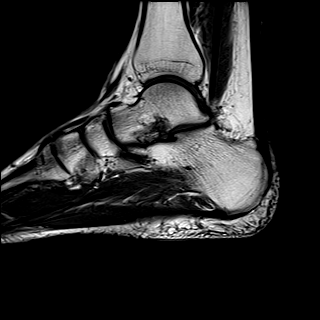
[im 15/20]
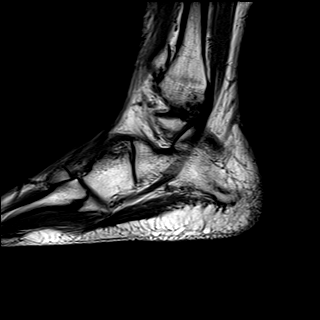
[im 20/20]
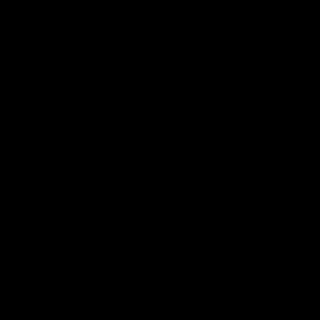

[Series 7: STIR · sagittal · 4.0mm · 0.35mm/px · 4 of 19 slices shown]
[im 1/19]
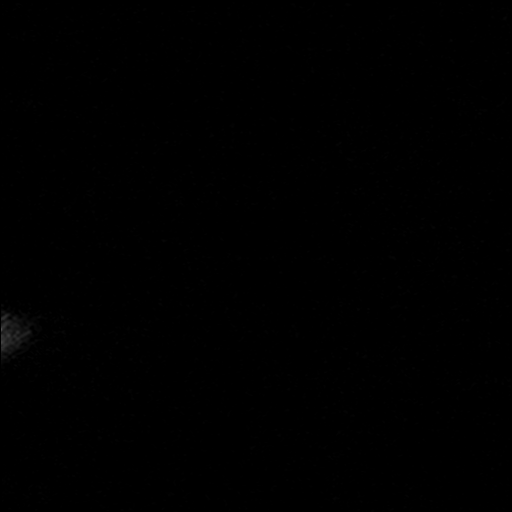
[im 7/19]
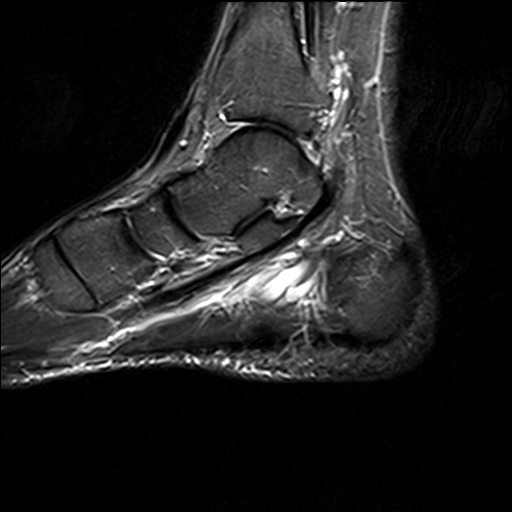
[im 13/19]
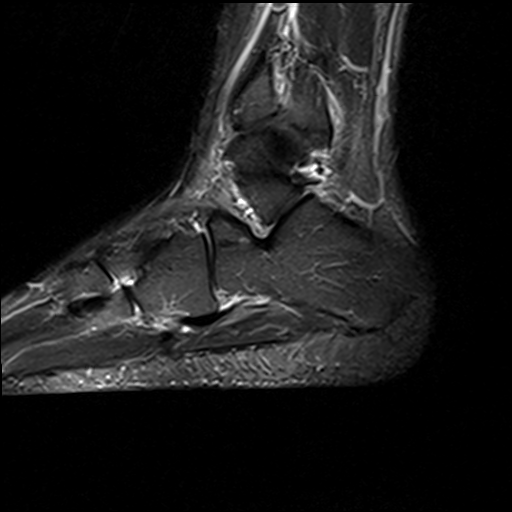
[im 19/19]
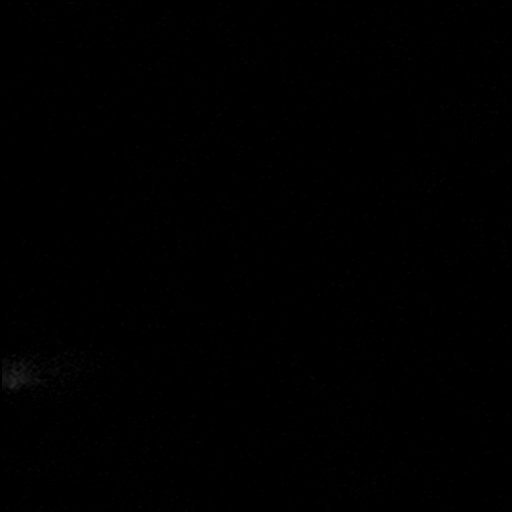

[Series 8: T2 fat-sat · coronal · 3.0mm · 0.50mm/px · 9 of 37 slices shown (2 of 2)]
[im 1/37]
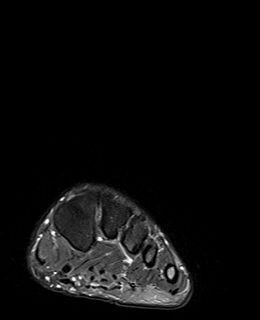
[im 5/37]
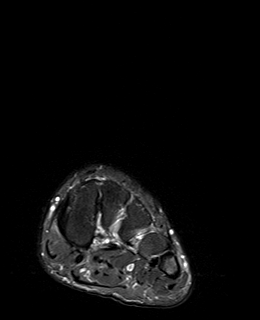
[im 10/37]
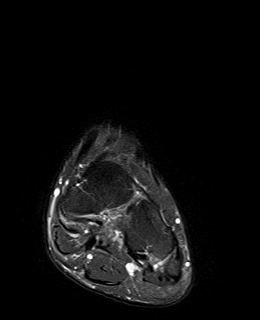
[im 14/37]
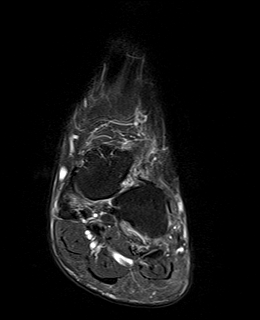
[im 19/37]
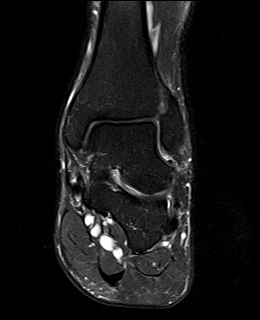
[im 23/37]
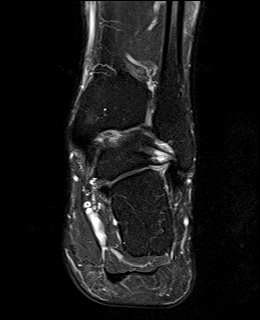
[im 28/37]
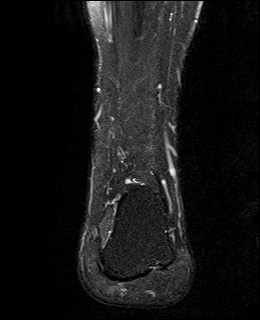
[im 32/37]
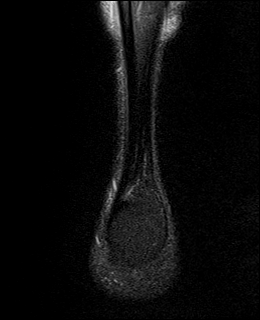
[im 37/37]
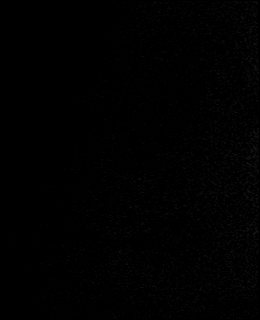

[40 of 40 positions shown; findings below may reference images not displayed]

FINDINGS: TENDONS

Peroneal: Peroneal longus tendon intact. Peroneal brevis intact.

Posteromedial: Posterior tibial tendon intact. Flexor digitorum
longus tendon intact. Flexor hallucis longus tendon intact.

Anterior: Tibialis anterior tendon intact. Extensor hallucis longus
tendon intact Extensor digitorum longus tendon intact.

Achilles:  Intact.

Plantar Fascia: Intact.

LIGAMENTS

Lateral: Anterior talofibular ligament is thickened but intact.
Calcaneofibular ligament intact. Posterior talofibular ligament
intact. Anterior and posterior tibiofibular ligaments intact.

Medial: Deltoid ligament intact. Spring ligament intact.

CARTILAGE

Ankle Joint: No joint effusion. Normal ankle mortise. No chondral
defect.

Subtalar Joints/Sinus Tarsi: Normal subtalar joints. No subtalar
joint effusion. Partial loss of the normal fat within the sinus
tarsi.

Bones: No marrow signal abnormality.  No fracture or dislocation.

Soft Tissue: No soft tissue mass or fluid collection.
IMPRESSION: 1. Partial loss of the normal fat within the sinus tarsi. Correlate
for sinus tarsi syndrome.
2. Thickening of the anterior talofibular ligament, consistent with
prior sprain.

## 2023-07-31 ENCOUNTER — Encounter: Payer: Self-pay | Admitting: Family Medicine

## 2023-07-31 ENCOUNTER — Ambulatory Visit: Payer: 59 | Admitting: Family Medicine

## 2023-07-31 VITALS — BP 119/63 | HR 57 | Temp 98.3°F | Ht 70.0 in | Wt 179.0 lb

## 2023-07-31 DIAGNOSIS — Z125 Encounter for screening for malignant neoplasm of prostate: Secondary | ICD-10-CM

## 2023-07-31 DIAGNOSIS — Z23 Encounter for immunization: Secondary | ICD-10-CM

## 2023-07-31 DIAGNOSIS — E1169 Type 2 diabetes mellitus with other specified complication: Secondary | ICD-10-CM | POA: Diagnosis not present

## 2023-07-31 DIAGNOSIS — E119 Type 2 diabetes mellitus without complications: Secondary | ICD-10-CM

## 2023-07-31 DIAGNOSIS — E7800 Pure hypercholesterolemia, unspecified: Secondary | ICD-10-CM | POA: Diagnosis not present

## 2023-07-31 DIAGNOSIS — E785 Hyperlipidemia, unspecified: Secondary | ICD-10-CM

## 2023-07-31 DIAGNOSIS — I10 Essential (primary) hypertension: Secondary | ICD-10-CM | POA: Diagnosis not present

## 2023-07-31 DIAGNOSIS — Z7984 Long term (current) use of oral hypoglycemic drugs: Secondary | ICD-10-CM

## 2023-07-31 DIAGNOSIS — N4830 Priapism, unspecified: Secondary | ICD-10-CM

## 2023-07-31 LAB — BAYER DCA HB A1C WAIVED: HB A1C (BAYER DCA - WAIVED): 6.6 % — ABNORMAL HIGH (ref 4.8–5.6)

## 2023-07-31 MED ORDER — PREDNISONE 10 MG PO TABS: ORAL_TABLET | ORAL | 0 refills | Status: DC

## 2023-07-31 NOTE — Progress Notes (Signed)
 [place]

## 2023-07-31 NOTE — Progress Notes (Signed)
 Subjective:  Patient ID: [name] [name],  male    DOB: [birth year]  Age: 58 y.o.    CC: Medical Management of Chronic Issues, Knee Pain (Seeing [facility]. Waiting to surgeon for right knee. Pt frustrating with how long this is taking aprx 2 months. Going on for several months. Having to take a lot of pain meds to manage pain.  Torn meniscus. ), and Nasal Congestion (Left nostril only. Gets clogged and has to blow nose every 30 minutes. On going for months. Pt uses Flonase  daily and does not help with this issue only with allergies. )   HPI [name] [name] presents for  follow-up of hypertension. Patient has no history of headache chest pain or shortness of breath or recent cough. Patient also denies symptoms of TIA such as numbness weakness lateralizing. Patient denies side effects from medication. States taking it regularly.  Patient also  in for follow-up of elevated cholesterol. Feels like he has the flu with current medication. Denies side effects  including myalgia and arthralgia and nausea. Also in today for liver function testing. Currently no chest pain, shortness of breath or other cardiovascular related symptoms noted.  Follow-up of diabetes. Patient does check blood sugar at home. Readings run around 100.  Patient denies symptoms such as excessive hunger or urinary frequency, excessive hunger, nausea No significant hypoglycemic spells noted. Medications reviewed. Pt reports taking them regularly. Pt. denies complication/adverse reaction today.   Right knee pain, torn meniscus. Saw Dr. Dante Dyer. 3 days ago. MRI done last week. Waiting until the end of May to see the surgeon. Has to climb stairs, etc at work. Taking OTC NSAIDS that aren't effective for him. NKI. Started with a click, followed by a lot f pain.    History [name] [name] has a past medical history of Chronic pain, COVID-19 (12/2018), Dental crown present, Diabetes mellitus without complication (HCC), Diverticulosis,  Hyperlipidemia, Hypertension, Internal hemorrhoids, and Plantar fasciitis, right.   He has a past surgical history that includes Right shoulder surgery (2010); Right Bicep surgery (2008); Ganglion cyst excision (2009); Shoulder arthroscopy (98,09); Colonoscopy; Ganglion cyst excision (Left, 01/17/2013); and Carpal tunnel release (Right, 03/06/2014).   His family history includes Diabetes in his mother; Heart disease in his mother; Kidney cancer in his father; Stroke in his mother.He reports that he has never smoked. He has never used smokeless tobacco. He reports that he does not drink alcohol and does not use drugs.  Current Outpatient Medications on File Prior to Visit  Medication Sig Dispense Refill   ACCU-CHEK GUIDE test strip USE USE TO CHECK GLUCOSE BEFORE BREAKFAST AND 2 HOURS AFTER SUPPER 100 strip 12   Accu-Chek Softclix Lancets lancets USE TO CHECK BLOOD SUGAR BEFORE BREAKFAST AND 2 HOURS AFTER SUPPER DAILY Dx E11.9 200 each 3   acetaminophen  (TYLENOL ) 500 MG tablet Take 500 mg by mouth every 6 (six) hours as needed.     blood glucose meter kit and supplies KIT Use up to four times daily as directed. (FOR ICD-10 : E11.9 1 each 11   cholecalciferol (VITAMIN D ) 1000 UNITS tablet Take 1,000 Units by mouth daily. 5000     fluticasone  (FLONASE ) 50 MCG/ACT nasal spray SPRAY 1 SPRAY INTO EACH NOSTRIL DAILY 48 mL 1   lisinopril -hydrochlorothiazide  (ZESTORETIC ) 20-25 MG tablet Take 1 tablet by mouth daily. 90 tablet 3   metFORMIN  (GLUCOPHAGE ) 500 MG tablet Take 1 tablet (500 mg total) by mouth 2 (two) times daily with a meal. 180 tablet 3  Multiple Vitamins-Minerals (MULTIVITAMIN WITH MINERALS) tablet Take 1 tablet by mouth daily.     pantoprazole  (PROTONIX ) 40 MG tablet Take 1 tablet (40 mg total) by mouth daily. For stomach 90 tablet 3   rosuvastatin  (CRESTOR ) 5 MG tablet TAKE 1 TABLET BY MOUTH ON MONDAYS, WEDNESDAYS, AND FRIDAYS EVENINGS 39 tablet 3   No current facility-administered  medications on file prior to visit.    ROS Review of Systems  Constitutional:  Negative for fever.  Respiratory:  Negative for shortness of breath.   Cardiovascular:  Negative for chest pain.  Musculoskeletal:  Negative for arthralgias.  Skin:  Negative for rash.    Objective:  BP 119/63   Pulse (!) 57   Temp 98.3 F (36.8 C)   Ht 5\' 10"  (1.778 m)   Wt 179 lb (81.2 kg)   SpO2 93%   BMI 25.68 kg/m   BP Readings from Last 3 Encounters:  07/31/23 119/63  05/19/23 128/78  02/09/23 126/73    Wt Readings from Last 3 Encounters:  07/31/23 179 lb (81.2 kg)  05/19/23 181 lb (82.1 kg)  02/09/23 181 lb 6.4 oz (82.3 kg)    Lab Results  Component Value Date   HGBA1C 6.6 (H) 07/31/2023   HGBA1C 6.1 (H) 02/09/2023   HGBA1C 6.1 (H) 08/09/2022    Physical Exam Vitals reviewed.  Constitutional:      Appearance: He is well-developed.  HENT:     Head: Normocephalic and atraumatic.     Right Ear: External ear normal.     Left Ear: External ear normal.     Mouth/Throat:     Pharynx: No oropharyngeal exudate or posterior oropharyngeal erythema.  Eyes:     Pupils: Pupils are equal, round, and reactive to light.  Cardiovascular:     Rate and Rhythm: Normal rate and regular rhythm.     Heart sounds: No murmur heard. Pulmonary:     Effort: No respiratory distress.     Breath sounds: Normal breath sounds.  Musculoskeletal:     Cervical back: Normal range of motion and neck supple.  Neurological:     Mental Status: He is alert and oriented to person, place, and time.         Assessment & Plan:  Type 2 diabetes mellitus without complication, without long-term current use of insulin (HCC) -     Bayer DCA Hb A1c Waived -     CBC with Differential/Platelet  Dyslipidemia associated with type 2 diabetes mellitus (HCC) -     CMP14+EGFR  Essential hypertension -     Bayer DCA Hb A1c Waived -     CBC with Differential/Platelet  Pure hypercholesterolemia -      CMP14+EGFR  Screening for prostate cancer -     PSA, total and free  Painful penile erection -     PSA, total and free  Immunization due -     Pneumococcal conjugate vaccine 20-valent  Other orders -     predniSONE ; Take 5 daily for 3 days followed by 4,3,2 and 1 for 3 days each.  Dispense: 45 tablet; Refill: 0    Follow-up: Return in about 3 months (around 10/30/2023).  Roise Cleaver, M.D.

## 2023-08-01 LAB — CBC WITH DIFFERENTIAL/PLATELET
Basophils Absolute: 0.1 10*3/uL (ref 0.0–0.2)
Basos: 1 %
EOS (ABSOLUTE): 0.5 10*3/uL — ABNORMAL HIGH (ref 0.0–0.4)
Eos: 6 %
Hematocrit: 43.6 % (ref 37.5–51.0)
Hemoglobin: 14.7 g/dL (ref 13.0–17.7)
Immature Grans (Abs): 0 10*3/uL (ref 0.0–0.1)
Immature Granulocytes: 0 %
Lymphocytes Absolute: 1.5 10*3/uL (ref 0.7–3.1)
Lymphs: 19 %
MCH: 30.2 pg (ref 26.6–33.0)
MCHC: 33.7 g/dL (ref 31.5–35.7)
MCV: 90 fL (ref 79–97)
Monocytes Absolute: 0.9 10*3/uL (ref 0.1–0.9)
Monocytes: 12 %
Neutrophils Absolute: 4.9 10*3/uL (ref 1.4–7.0)
Neutrophils: 62 %
Platelets: 354 10*3/uL (ref 150–450)
RBC: 4.86 x10E6/uL (ref 4.14–5.80)
RDW: 12.7 % (ref 11.6–15.4)
WBC: 7.9 10*3/uL (ref 3.4–10.8)

## 2023-08-01 LAB — CMP14+EGFR
ALT: 20 IU/L (ref 0–44)
AST: 18 IU/L (ref 0–40)
Albumin: 4.3 g/dL (ref 3.8–4.9)
Alkaline Phosphatase: 48 IU/L (ref 44–121)
BUN/Creatinine Ratio: 13 (ref 9–20)
BUN: 11 mg/dL (ref 6–24)
Bilirubin Total: 0.6 mg/dL (ref 0.0–1.2)
CO2: 24 mmol/L (ref 20–29)
Calcium: 9.7 mg/dL (ref 8.7–10.2)
Chloride: 96 mmol/L (ref 96–106)
Creatinine, Ser: 0.87 mg/dL (ref 0.76–1.27)
Globulin, Total: 2.3 g/dL (ref 1.5–4.5)
Glucose: 103 mg/dL — ABNORMAL HIGH (ref 70–99)
Potassium: 4.4 mmol/L (ref 3.5–5.2)
Sodium: 135 mmol/L (ref 134–144)
Total Protein: 6.6 g/dL (ref 6.0–8.5)
eGFR: 101 mL/min/{1.73_m2} (ref >59)

## 2023-08-01 LAB — PSA, TOTAL AND FREE
PSA, Free Pct: 25.7 %
PSA, Free: 0.18 ng/mL
Prostate Specific Ag, Serum: 0.7 ng/mL (ref 0.0–4.0)

## 2023-08-03 ENCOUNTER — Other Ambulatory Visit: Payer: Self-pay | Admitting: Family Medicine

## 2023-08-06 ENCOUNTER — Encounter: Payer: Self-pay | Admitting: Family Medicine

## 2023-08-06 NOTE — Progress Notes (Signed)
Hello [name] [name],  Your lab result is normal and/or stable.Some minor variations that are not significant are commonly marked abnormal, but do not represent any medical problem for you.  Best regards, Shauntae Reitman, M.D.

## 2023-08-31 ENCOUNTER — Encounter: Payer: Self-pay | Admitting: Podiatry

## 2023-08-31 ENCOUNTER — Ambulatory Visit: Admitting: Podiatry

## 2023-08-31 ENCOUNTER — Ambulatory Visit (INDEPENDENT_AMBULATORY_CARE_PROVIDER_SITE_OTHER)

## 2023-08-31 VITALS — Ht 70.0 in | Wt 179.0 lb

## 2023-08-31 DIAGNOSIS — M778 Other enthesopathies, not elsewhere classified: Secondary | ICD-10-CM | POA: Diagnosis not present

## 2023-08-31 DIAGNOSIS — M7752 Other enthesopathy of left foot: Secondary | ICD-10-CM

## 2023-08-31 DIAGNOSIS — M722 Plantar fascial fibromatosis: Secondary | ICD-10-CM | POA: Diagnosis not present

## 2023-08-31 DIAGNOSIS — G5761 Lesion of plantar nerve, right lower limb: Secondary | ICD-10-CM

## 2023-08-31 MED ORDER — TRIAMCINOLONE ACETONIDE 10 MG/ML IJ SUSP
10.0000 mg | Freq: Once | INTRAMUSCULAR | Status: AC
Start: 2023-08-31 — End: 2023-08-31
  Administered 2023-08-31: 10 mg via INTRA_ARTICULAR

## 2023-09-01 NOTE — Progress Notes (Signed)
 Subjective:   Patient ID: [name] [name], male   DOB: 58 y.o.   MRN: [id number]   HPI Patient presents with several conditions with 1 being discomfort and pain in the right heel that become inflamed and secondarily the possibility of neuroma symptomatology right with patient stating that this has been an ongoing issue and has not responded conservatively   ROS      Objective:  Physical Exam  Neuro vascular status intact with acute inflammation fluid around the medial fascial band right at the insertional point of the tendon calcaneus and then also what appears to be shooting radiating pain of the third interspace right that can become tender and make it hard to walk comfortably with shooting pains into the adjacent digits     Assessment:  Acute plantar fasciitis right along with neuroma symptomatology most likely right but cannot make full diagnosis at this     Plan:  H&P x-rays reviewed number to focus on both problems.  I did do sterile prep I injected the plantar fascia right 3 mg Kenalog  5 mg Xylocaine  and I then went ahead and I did sterile prep and I injected the third interspace with a combination of Marcaine  and Xylocaine  a small amount of steroid to reduce the inflammatory cycle but I did discuss with him there is a very good chance he will require surgical resection of the nerve depending on response  X-rays indicate small plantar spur no indication stress fracture no forefoot pathology was noted

## 2023-09-21 ENCOUNTER — Encounter: Payer: Self-pay | Admitting: Podiatry

## 2023-09-21 ENCOUNTER — Ambulatory Visit: Admitting: Podiatry

## 2023-09-21 DIAGNOSIS — M7752 Other enthesopathy of left foot: Secondary | ICD-10-CM

## 2023-09-21 DIAGNOSIS — M722 Plantar fascial fibromatosis: Secondary | ICD-10-CM | POA: Diagnosis not present

## 2023-09-21 MED ORDER — TRIAMCINOLONE ACETONIDE 10 MG/ML IJ SUSP
10.0000 mg | Freq: Once | INTRAMUSCULAR | Status: AC
  Administered 2023-09-21: 10 mg via INTRA_ARTICULAR

## 2023-09-21 NOTE — Progress Notes (Signed)
 Subjective:   Patient ID: [name] [name], male   DOB: 58 y.o.   MRN: [id number]   HPI Patient states doing some better in the forefoot right but having a lot of inflammation in the left foot in the joint surface and in the right heel.  States both those areas are bothering him he did have meniscus tear repair approximately 2 weeks ago   ROS      Objective:  Physical Exam  Ocular status intact with patient noted to have discomfort in the plantar heel right with inflammation fluid buildup and on the left there is found to be inflammation fluid of the second metatarsal phalangeal joint which has been present for around 6 weeks     Assessment:  Inflammatory capsulitis left second MPJ and plantar pain in the heel right with improved right forefoot with probable neuroma     Plan:  H&P reviewed all 3 conditions and today I am focusing on the 2 acute problems and for the right I did sterile prep injected the fascia and insertion 3 mg Kenalog  5 mg Xylocaine  applied.  This been for the left forefoot block 60 mg Xylocaine  Marcaine  mixture I then did sterile prep and aspirated the joint getting out a small amount of clear fluid injected quarter cc dexamethasone  Kenalog  advised on rigid bottom shoes reappoint to recheck

## 2023-09-24 ENCOUNTER — Other Ambulatory Visit: Payer: Self-pay | Admitting: Family Medicine

## 2023-10-03 ENCOUNTER — Other Ambulatory Visit (HOSPITAL_COMMUNITY): Payer: Self-pay

## 2023-10-03 ENCOUNTER — Telehealth: Payer: Self-pay | Admitting: Family Medicine

## 2023-10-03 ENCOUNTER — Telehealth: Payer: Self-pay

## 2023-10-03 NOTE — Telephone Encounter (Signed)
 Called pharmacy. Per technician, strips went through insurance. He received a 90 day supply for around $49. No PA was required.

## 2023-10-03 NOTE — Telephone Encounter (Signed)
 Pharmacy Patient Advocate Encounter   Received notification from Pt Calls Messages that prior authorization for ACCU CHEK GUIDE TEST STRIPS is required/requested.   Insurance verification completed.   The patient is insured through [employer] .   Per test claim: The current 50 day co-pay is, $19.61.  No PA needed at this time. This test claim was processed through [facility]- copay amounts may vary at other pharmacies due to pharmacy/plan contracts, or as the patient moves through the different stages of their insurance plan.

## 2023-10-03 NOTE — Telephone Encounter (Signed)
 Copied from CRM [id number]. Topic: Clinical - Prescription Issue >> Oct 03, 2023 12:54 PM [name] [name] wrote: Reason for CRM: in regards to the accucheck test strips the maximum for 90 days is 153. Pt had to pay out of pocket since the amount that was called in was over this amount. He is requesting a prior authorization so that he can get his money back for the extra 47. Call [facility] at [phone]. Please advise pt.

## 2023-10-19 ENCOUNTER — Ambulatory Visit: Admitting: Podiatry

## 2023-10-19 ENCOUNTER — Encounter: Payer: Self-pay | Admitting: Podiatry

## 2023-10-19 DIAGNOSIS — M722 Plantar fascial fibromatosis: Secondary | ICD-10-CM | POA: Diagnosis not present

## 2023-10-19 MED ORDER — TRIAMCINOLONE ACETONIDE 10 MG/ML IJ SUSP: 10.0000 mg | Freq: Once | INTRAMUSCULAR | Status: AC

## 2023-10-20 NOTE — Progress Notes (Signed)
 Subjective:   Patient ID: [name] [name], male   DOB: 58 y.o.   MRN: [id number]   HPI Patient states he still getting pain in his heel and it seems to be worse when he gets up in the morning and he cannot stretch it the way he is supposed to do it.  Patient states that it has been difficult for him   ROS      Objective:  Physical Exam  Neurovascular status intact exquisite discomfort around the heel at the insertional point tendon calcaneus right fluid buildup noted     Assessment:  Acute plantar fasciitis right with patient not stretching this the way he should     Plan:  H&P reviewed I have recommended stretching exercises and I dispensed night splint that I want him to start using along with heat ice therapy.  I did sterile prep injected the fascia 3 mg Kenalog  5 g Liken applied sterile dressing reappoint to recheck

## 2023-10-25 ENCOUNTER — Other Ambulatory Visit: Payer: Self-pay | Admitting: Podiatry

## 2023-10-25 ENCOUNTER — Telehealth: Payer: Self-pay | Admitting: Podiatry

## 2023-10-25 MED ORDER — IBUPROFEN 600 MG PO TABS
600.0000 mg | ORAL_TABLET | Freq: Three times a day (TID) | ORAL | 0 refills | Status: DC | PRN
Start: 2023-10-25 — End: 2023-11-15

## 2023-10-25 NOTE — Telephone Encounter (Signed)
 done

## 2023-10-25 NOTE — Telephone Encounter (Signed)
 Patient is stating you offered to place an order a for Ibuprofen  on his last visit for 600 mg tablets and he has not heard anything regarding the prescription. Patient contact telephione number, [phone]

## 2023-11-09 ENCOUNTER — Ambulatory Visit: Admitting: Podiatry

## 2023-11-15 ENCOUNTER — Other Ambulatory Visit: Payer: Self-pay | Admitting: Family Medicine

## 2023-11-15 ENCOUNTER — Ambulatory Visit (INDEPENDENT_AMBULATORY_CARE_PROVIDER_SITE_OTHER)

## 2023-11-15 ENCOUNTER — Ambulatory Visit: Admitting: Podiatry

## 2023-11-15 ENCOUNTER — Encounter: Payer: Self-pay | Admitting: Podiatry

## 2023-11-15 DIAGNOSIS — S9032XA Contusion of left foot, initial encounter: Secondary | ICD-10-CM

## 2023-11-15 DIAGNOSIS — M779 Enthesopathy, unspecified: Secondary | ICD-10-CM

## 2023-11-15 MED ORDER — IBUPROFEN 600 MG PO TABS: 600.0000 mg | ORAL_TABLET | Freq: Three times a day (TID) | ORAL | 0 refills | Status: AC | PRN

## 2023-11-15 MED ORDER — TRIAMCINOLONE ACETONIDE 10 MG/ML IJ SUSP
10.0000 mg | Freq: Once | INTRAMUSCULAR | Status: AC
Start: 2023-11-15 — End: ?

## 2023-11-16 NOTE — Progress Notes (Signed)
 Subjective:   Patient ID: [name] [name], male   DOB: 58 y.o.   MRN: [id number]   HPI Patient states he is starting to make some progress with his right heel and has found a night splint that he can tolerate and his left foot the joint seems good but there is a lot of pain on top of his foot and he was concerned about that more recently.  He does not have swelling associated with it   ROS      Objective:  Physical Exam  Neurovascular status was found to be intact muscle strength was found to be adequate range of motion adequate with patient found to have discomfort of the dorsum of the left foot around the 2nd and 3rd metatarsal shaft with improvement of the metatarsal phalangeal joints and improvement of discomfort right heel and neuroma that does not appear active at the current time     Assessment:  Cannot rule out pathology bony or soft tissue dorsal left with everything else appearing to be improved at this time     Plan:  H&P precautionary x-ray left taken and I went ahead today and I did a careful injection dorsal complex left 3 mg dexamethasone  Kenalog  5 mg Xylocaine  after explaining risk and applied sterile dressing.  Hopefully this will be the last problem he deals with and the other problems he will continue with night splint continue with wearing good shoes with stiff soles  X-rays indicate no signs of stress fracture associated with the inflammation appears to be soft tissue

## 2023-11-24 ENCOUNTER — Ambulatory Visit: Payer: Self-pay

## 2023-11-24 NOTE — Telephone Encounter (Signed)
 APPT MADE

## 2023-11-24 NOTE — Telephone Encounter (Signed)
 FYI Only or Action Required?: FYI only for provider.  Patient was last seen in primary care on 07/31/2023 by Zollie Lowers, MD.  Called Nurse Triage reporting Headache.  Symptoms began a week ago.  Interventions attempted: OTC medications: Tylenol , Motrin .  Symptoms are: unchanged.  Triage Disposition: See PCP When Office is Open (Within 3 Days)  Patient/caregiver understands and will follow disposition?: Yes   Copied from CRM [id number]. Topic: Clinical - Red Word Triage >> Nov 24, 2023  9:08 AM Vena H wrote: Red Word that prompted transfer to Nurse Triage: Pt is having weird pains coming from his head which is sore to the touch, states it comes in flashes when he is just sitting it is 2/10 but when the pain flares and flashes it is a 8/10. Has been going on a week, pt states he does not get headaches Reason for Disposition  [1] MILD-MODERATE headache AND [2] present > 3 days (72 hours) AND [3] no improvement after using Care Advice  Answer Assessment - Initial Assessment Questions 1. LOCATION: Where does it hurt?      Top of head-hurts at scalp not so much a headache.  2. ONSET: When did the headache start? (e.g., minutes, hours, days)      One week ago 3. PATTERN: Does the pain come and go, or has it been constant since it started?     Intermittent daily 4. SEVERITY: How bad is the pain? and What does it keep you from doing?  (e.g., Scale 1-10; mild, moderate, or severe)     Varies 2/10 lowest, highest 8/10 5. RECURRENT SYMPTOM: Have you ever had headaches before? If Yes, ask: When was the last time? and What happened that time?      Denies 6. CAUSE: What do you think is causing the headache?     Unsure 7. MIGRAINE: Have you been diagnosed with migraine headaches? If Yes, ask: Is this headache similar?      Denies 8. HEAD INJURY: Has there been any recent injury to your head?      Denies 9. OTHER SYMPTOMS: Do you have any other symptoms? (e.g.,  fever, stiff neck, eye pain, sore throat, cold symptoms)     Tender to touch. When chewing this morning had tender throat but no longer feeling tenderness.   Additional info: Patient reports head pain at top of head, scalp tenderness, not feeling so much like headache, feels like a bruise like he hit his head but no known injury, states he does sometimes hit his head on headboard of bed but doesn't think he has recently. Denies vision changes, weakness, tingling, fever, stiff neck, and all other symptoms besides chronic foot pain from plantar facitis.  Protocols used: [facility]

## 2023-11-27 ENCOUNTER — Ambulatory Visit: Admitting: Family Medicine

## 2023-11-29 ENCOUNTER — Encounter: Payer: Self-pay | Admitting: Family Medicine

## 2023-12-12 ENCOUNTER — Encounter: Payer: Self-pay | Admitting: Family Medicine

## 2023-12-12 ENCOUNTER — Ambulatory Visit: Admitting: Family Medicine

## 2023-12-12 VITALS — BP 132/75 | HR 71 | Temp 97.8°F | Ht 70.0 in | Wt 175.0 lb

## 2023-12-12 DIAGNOSIS — R432 Parageusia: Secondary | ICD-10-CM | POA: Diagnosis not present

## 2023-12-12 DIAGNOSIS — J302 Other seasonal allergic rhinitis: Secondary | ICD-10-CM | POA: Diagnosis not present

## 2023-12-12 NOTE — Progress Notes (Signed)
 Subjective:  Patient ID: [name] [name], male    DOB: 11/26/1965  Age: 58 y.o. MRN: [id number]  CC: taste bud dysfunction (Home test flu and covid (-)/No other symptoms/)   HPI  Discussed the use of AI scribe software for clinical note transcription with the patient, who gave verbal consent to proceed.  History of Present Illness [name] [name] is a 58 year old male who presents with altered taste sensation.  He noticed a change in taste sensation that began three days ago while eating supper, initially describing soda as having a 'blah taste'. By the following day, he was unable to discern sweet or savory flavors, only detecting a hint of taste. He did not experience fever but felt unusually lethargic, which is atypical for him as he is usually active. A COVID-19 test at [facility] was negative.  On the third day, he felt fine and engaged in physical activity without issues, but continued to experience altered taste. Water is the only thing that tastes normal. He typically avoids sweets due to being pre-diabetic, but even sugar-free sweets do not taste right. He has not noticed any changes in his sense of smell.  He has a history of nasal congestion on one side, for which he saw an ENT three months ago. A CT scan of his nasal cavity showed no significant findings. He uses generic Flonase  daily, which has helped with his hay fever and pollen allergies, particularly in the spring. He has not had any recent changes in medication, though he was prescribed a new medication by a urologist, which he has not yet started.  He experienced a mild headache on the second day, which is unusual for him, lasting only a few hours. He took Nyquil that night to ensure a good night's sleep. No issues with his sense of smell, and no sneezing or other allergy symptoms recently.          12/12/2023    4:18 PM 05/19/2023   10:18 AM 02/09/2023    8:09 AM  Depression screen PHQ 2/9  Decreased Interest 0 0 0  Down,  Depressed, Hopeless 0 0 0  PHQ - 2 Score 0 0 0  Altered sleeping 0    Tired, decreased energy 0    Change in appetite 0    Feeling bad or failure about yourself  0    Trouble concentrating 0    Moving slowly or fidgety/restless 0    Suicidal thoughts 0    PHQ-9 Score 0    Difficult doing work/chores Not difficult at all      History Hisham has a past medical history of Chronic pain, COVID-19 (12/2018), Dental crown present, Diabetes mellitus without complication (HCC), Diverticulosis, Hyperlipidemia, Hypertension, Internal hemorrhoids, and Plantar fasciitis, right.   He has a past surgical history that includes Right shoulder surgery (2010); Right Bicep surgery (2008); Ganglion cyst excision (2009); Shoulder arthroscopy (98,09); Colonoscopy; Ganglion cyst excision (Left, 01/17/2013); and Carpal tunnel release (Right, 03/06/2014).   His family history includes Diabetes in his mother; Heart disease in his mother; Kidney cancer in his father; Stroke in his mother.He reports that he has never smoked. He has never used smokeless tobacco. He reports that he does not drink alcohol and does not use drugs.    ROS Review of Systems  Constitutional:  Negative for activity change, appetite change, chills and fever.  HENT:  Positive for congestion. Negative for ear discharge, ear pain, hearing loss, nosebleeds, postnasal drip, rhinorrhea, sneezing and  trouble swallowing.   Respiratory:  Negative for chest tightness and shortness of breath.   Cardiovascular:  Negative for chest pain and palpitations.  Skin:  Negative for rash.    Objective:  BP 132/75   Pulse 71   Temp 97.8 F (36.6 C) (Oral)   Ht 5' 10 (1.778 m)   Wt 175 lb (79.4 kg)   SpO2 97%   BMI 25.11 kg/m   BP Readings from Last 3 Encounters:  12/12/23 132/75  07/31/23 119/63  05/19/23 128/78    Wt Readings from Last 3 Encounters:  12/12/23 175 lb (79.4 kg)  08/31/23 179 lb (81.2 kg)  07/31/23 179 lb (81.2 kg)      Physical Exam Vitals reviewed.  Constitutional:      Appearance: He is well-developed.  HENT:     Head: Normocephalic and atraumatic.     Right Ear: External ear normal.     Left Ear: External ear normal.     Nose: Congestion and rhinorrhea present.     Mouth/Throat:     Mouth: Mucous membranes are moist.     Pharynx: No oropharyngeal exudate.  Eyes:     Pupils: Pupils are equal, round, and reactive to light.  Cardiovascular:     Rate and Rhythm: Normal rate and regular rhythm.     Heart sounds: No murmur heard. Pulmonary:     Effort: No respiratory distress.     Breath sounds: Normal breath sounds.  Musculoskeletal:     Cervical back: Normal range of motion and neck supple.  Neurological:     Mental Status: He is alert and oriented to person, place, and time.      Assessment & Plan:  Taste absent -     COVID-19, Flu A+B and RSV  Seasonal allergic rhinitis, unspecified trigger    Assessment and Plan Assessment & Plan Parageusia and nasal congestion   He experiences parageusia with diminished taste of sweet and savory flavors since Saturday. There is no fever or significant systemic symptoms, and a home COVID test was negative. A mild headache occurred on Sunday but resolved within hours. He reports unilateral nasal stuffiness without significant congestion. An ENT evaluation and CT scan three months ago showed no significant findings. The differential diagnosis includes viral infection, allergies, or rare serious conditions like stroke, though the latter is unlikely due to the absence of other neurological symptoms. The symptoms are most likely related to nasal congestion affecting the sense of smell, impacting taste perception. Start Allegra D 24-hour for decongestion and allergy management and continue using Flonase  daily. Monitor symptoms and report if he progresses to include fever, shortness of breath, or significant cough. Ensure adequate hydration. Await results  of the COVID test performed in the office. Avoid contact with high-risk individuals for at least five days if the COVID test is positive. Consider Paxlovid if symptoms progress and the COVID test is positive.       Follow-up: No follow-ups on file.  Butler Der, M.D.

## 2023-12-14 ENCOUNTER — Telehealth: Payer: Self-pay

## 2023-12-14 LAB — COVID-19, FLU A+B AND RSV
Influenza A, NAA: NOT DETECTED
Influenza B, NAA: NOT DETECTED
RSV, NAA: NOT DETECTED
SARS-CoV-2, NAA: NOT DETECTED

## 2023-12-14 NOTE — Telephone Encounter (Signed)
 Pt called back for results, says this is holding his whole life up waiting on results that were supposed to be here yesterday

## 2023-12-14 NOTE — Telephone Encounter (Signed)
 Copied from CRM [id number]. Topic: Clinical - Lab/Test Results >> Dec 13, 2023  5:02 PM [name] [name] wrote: Reason for CRM: Patient is checking in on results for covid/flu. Advised allow more time, final result not available yet. Please reach out once results available.

## 2023-12-14 NOTE — Telephone Encounter (Signed)
 Pt will be updated once results come back

## 2023-12-15 NOTE — Telephone Encounter (Signed)
 Pt made aware of negative results. He states that he does not feel any better. OTC meds not helping. States that he has an appt with ENT next Tuesday and will discuss with them.

## 2023-12-17 ENCOUNTER — Ambulatory Visit: Payer: Self-pay | Admitting: Family Medicine

## 2024-01-19 ENCOUNTER — Other Ambulatory Visit: Payer: Self-pay | Admitting: Family Medicine

## 2024-02-02 ENCOUNTER — Other Ambulatory Visit: Payer: Self-pay | Admitting: Family Medicine

## 2024-02-02 DIAGNOSIS — E119 Type 2 diabetes mellitus without complications: Secondary | ICD-10-CM

## 2024-02-12 ENCOUNTER — Ambulatory Visit: Payer: Self-pay | Admitting: Family Medicine

## 2024-02-12 ENCOUNTER — Encounter: Payer: Self-pay | Admitting: Family Medicine

## 2024-02-12 VITALS — BP 138/82 | HR 60 | Temp 98.0°F | Ht 70.0 in | Wt 178.0 lb

## 2024-02-12 DIAGNOSIS — Z7984 Long term (current) use of oral hypoglycemic drugs: Secondary | ICD-10-CM | POA: Diagnosis not present

## 2024-02-12 DIAGNOSIS — E1169 Type 2 diabetes mellitus with other specified complication: Secondary | ICD-10-CM

## 2024-02-12 DIAGNOSIS — E785 Hyperlipidemia, unspecified: Secondary | ICD-10-CM

## 2024-02-12 DIAGNOSIS — I10 Essential (primary) hypertension: Secondary | ICD-10-CM

## 2024-02-12 DIAGNOSIS — E7800 Pure hypercholesterolemia, unspecified: Secondary | ICD-10-CM

## 2024-02-12 DIAGNOSIS — K2100 Gastro-esophageal reflux disease with esophagitis, without bleeding: Secondary | ICD-10-CM

## 2024-02-12 DIAGNOSIS — Z0001 Encounter for general adult medical examination with abnormal findings: Secondary | ICD-10-CM | POA: Diagnosis not present

## 2024-02-12 DIAGNOSIS — E119 Type 2 diabetes mellitus without complications: Secondary | ICD-10-CM

## 2024-02-12 DIAGNOSIS — Z0000 Encounter for general adult medical examination without abnormal findings: Secondary | ICD-10-CM

## 2024-02-12 DIAGNOSIS — Z23 Encounter for immunization: Secondary | ICD-10-CM | POA: Diagnosis not present

## 2024-02-12 DIAGNOSIS — M1990 Unspecified osteoarthritis, unspecified site: Secondary | ICD-10-CM

## 2024-02-12 LAB — BAYER DCA HB A1C WAIVED: HB A1C (BAYER DCA - WAIVED): 5.9 % — ABNORMAL HIGH (ref 4.8–5.6)

## 2024-02-12 LAB — COMPREHENSIVE METABOLIC PANEL WITH GFR
ALT: 15 IU/L (ref 0–44)
AST: 22 IU/L (ref 0–40)
Albumin: 4.6 g/dL (ref 3.8–4.9)
Alkaline Phosphatase: 51 IU/L (ref 47–123)
BUN/Creatinine Ratio: 20 (ref 9–20)
BUN: 16 mg/dL (ref 6–24)
Bilirubin Total: 0.8 mg/dL (ref 0.0–1.2)
CO2: 22 mmol/L (ref 20–29)
Calcium: 10.1 mg/dL (ref 8.7–10.2)
Chloride: 98 mmol/L (ref 96–106)
Creatinine, Ser: 0.8 mg/dL (ref 0.76–1.27)
Globulin, Total: 2.6 g/dL (ref 1.5–4.5)
Glucose: 110 mg/dL — ABNORMAL HIGH (ref 70–99)
Potassium: 4.7 mmol/L (ref 3.5–5.2)
Sodium: 137 mmol/L (ref 134–144)
Total Protein: 7.2 g/dL (ref 6.0–8.5)
eGFR: 103 mL/min/1.73 (ref >59)

## 2024-02-12 LAB — CBC WITH DIFFERENTIAL/PLATELET
Basophils Absolute: 0.1 x10E3/uL (ref 0.0–0.2)
Basos: 2 %
EOS (ABSOLUTE): 0.5 x10E3/uL — ABNORMAL HIGH (ref 0.0–0.4)
Eos: 9 %
Hematocrit: 46.7 % (ref 37.5–51.0)
Hemoglobin: 15.2 g/dL (ref 13.0–17.7)
Immature Grans (Abs): 0 x10E3/uL (ref 0.0–0.1)
Immature Granulocytes: 0 %
Lymphocytes Absolute: 1.7 x10E3/uL (ref 0.7–3.1)
Lymphs: 33 %
MCH: 30 pg (ref 26.6–33.0)
MCHC: 32.5 g/dL (ref 31.5–35.7)
MCV: 92 fL (ref 79–97)
Monocytes Absolute: 0.6 x10E3/uL (ref 0.1–0.9)
Monocytes: 12 %
Neutrophils Absolute: 2.3 x10E3/uL (ref 1.4–7.0)
Neutrophils: 44 %
Platelets: 367 x10E3/uL (ref 150–450)
RBC: 5.07 x10E6/uL (ref 4.14–5.80)
RDW: 12.6 % (ref 11.6–15.4)
WBC: 5.2 x10E3/uL (ref 3.4–10.8)

## 2024-02-12 MED ORDER — ROSUVASTATIN CALCIUM 5 MG PO TABS
ORAL_TABLET | ORAL | 3 refills | Status: AC
Start: 2024-02-12 — End: ?

## 2024-02-12 MED ORDER — LISINOPRIL-HYDROCHLOROTHIAZIDE 20-25 MG PO TABS: 1.0000 | ORAL_TABLET | Freq: Every day | ORAL | 3 refills | Status: AC

## 2024-02-12 MED ORDER — PANTOPRAZOLE SODIUM 40 MG PO TBEC
40.0000 mg | DELAYED_RELEASE_TABLET | Freq: Every day | ORAL | 0 refills | Status: AC
Start: 2024-02-12 — End: ?

## 2024-02-12 NOTE — Patient Instructions (Signed)

## 2024-02-12 NOTE — Progress Notes (Signed)
 Subjective:  Patient ID: [name] [name], male    DOB: 02/01/1966  Age: 58 y.o. MRN: [id number]  CC: Annual Exam (No concerns)   HPI  Discussed the use of AI scribe software for clinical note transcription with the patient, who gave verbal consent to proceed.  History of Present Illness [name] [name] is a 58 year old male who presents for a routine physical exam.  He has experienced a partial return of his sense of taste following a viral syndrome, though some flavors still do not taste right. An ENT conducted several tests but found no discernible cause; the ENT thought it may be viral and something he can't detect.  He underwent knee surgery in the spring and continues to experience some problems, though he remains active. He is currently experiencing lower back pain, which he attributes to spinal degeneration diagnosed years ago. He has been dealing with this pain for about two weeks and finds that stretching exercises sometimes exacerbate the pain. He uses over-the-counter medications like Advil  or Aleve for relief.  He has foot pain, particularly in the right foot, which may require surgery for plantar fasciitis. He experiences joint pain in the left foot, which sometimes keeps him up at night. He has received cortisone injections and fluid aspiration in the joint, but relief is temporary. Prednisone  has been effective in the past, but he is cautious due to its impact on his diabetes.  He has a history of reflux, which is well-controlled unless he consumes trigger foods like jalapenos. He takes medication for reflux, which has been effective.  He had a biopsy on a lesion on his temple about two to three weeks ago due to concerns it might be cancerous. He is awaiting results from his dermatologist.  He manages his diabetes with metformin , taken twice daily. His morning blood sugar levels are typically around 100, with variations in the evening depending on his diet.  He experiences foot  pain that affects his ability to exercise, contributing to weight gain. He uses an elliptical and recumbent bike but finds these activities painful for his feet and knees.  He has a history of a testicular issue where his left testicle retracted during sleep, causing significant pain. It has since improved but remains sore. He is considering changing his sleeping position to prevent recurrence.  He has a history of a penile injury during intercourse, which led to a curvature in his erection. He takes medication to manage this condition, adjusting the timing to avoid side effects. He experiences occasional pain during sexual activity but manages it by avoiding certain positions.          02/12/2024    8:06 AM 12/12/2023    4:18 PM 05/19/2023   10:18 AM  Depression screen PHQ 2/9  Decreased Interest 0 0 0  Down, Depressed, Hopeless 0 0 0  PHQ - 2 Score 0 0 0  Altered sleeping 0 0   Tired, decreased energy 0 0   Change in appetite 0 0   Feeling bad or failure about yourself  0 0   Trouble concentrating 0 0   Moving slowly or fidgety/restless 0 0   Suicidal thoughts 0 0   PHQ-9 Score 0 0    Difficult doing work/chores Not difficult at all Not difficult at all      Data saved with a previous flowsheet row definition    History [name] [name] has a past medical history of Chronic pain, COVID-19 (12/2018), Dental crown present,  Diabetes mellitus without complication (HCC), Diverticulosis, Hyperlipidemia, Hypertension, Internal hemorrhoids, and Plantar fasciitis, right.   He has a past surgical history that includes Right shoulder surgery (04/04/2008); Right Bicep surgery (04/04/2006); Ganglion cyst excision (04/05/2007); Shoulder arthroscopy (98,09); Colonoscopy; Ganglion cyst excision (Left, 01/17/2013); Carpal tunnel release (Right, 03/06/2014); and Knee surgery (Right).   His family history includes Diabetes in his mother; Heart disease in his mother; Kidney cancer in his father; Stroke in his  mother.He reports that he has never smoked. He has never used smokeless tobacco. He reports that he does not drink alcohol and does not use drugs.    ROS Review of Systems  Constitutional:  Negative for activity change, fatigue and unexpected weight change.  HENT:  Negative for congestion, ear pain, hearing loss, postnasal drip and trouble swallowing.   Eyes:  Negative for pain and visual disturbance.  Respiratory:  Negative for cough, chest tightness and shortness of breath.   Cardiovascular:  Negative for chest pain, palpitations and leg swelling.  Gastrointestinal:  Negative for abdominal distention, abdominal pain, blood in stool, constipation, diarrhea, nausea and vomiting.  Endocrine: Negative for cold intolerance, heat intolerance and polydipsia.  Genitourinary:  Negative for difficulty urinating, dysuria, flank pain, frequency and urgency.  Musculoskeletal:  Positive for arthralgias. Negative for joint swelling.  Skin:  Negative for color change, rash and wound.  Neurological:  Negative for dizziness, syncope, speech difficulty, weakness, light-headedness, numbness and headaches.  Hematological:  Does not bruise/bleed easily.  Psychiatric/Behavioral:  Negative for confusion, decreased concentration, dysphoric mood and sleep disturbance. The patient is not nervous/anxious.     Objective:  BP 138/82   Pulse 60   Temp 98 F (36.7 C)   Ht 5' 10 (1.778 m)   Wt 178 lb (80.7 kg)   SpO2 97%   BMI 25.54 kg/m   BP Readings from Last 3 Encounters:  02/12/24 138/82  12/12/23 132/75  07/31/23 119/63    Wt Readings from Last 3 Encounters:  02/12/24 178 lb (80.7 kg)  12/12/23 175 lb (79.4 kg)  08/31/23 179 lb (81.2 kg)     Physical Exam Constitutional:      Appearance: He is well-developed.  HENT:     Head: Normocephalic and atraumatic.  Eyes:     Pupils: Pupils are equal, round, and reactive to light.  Neck:     Thyroid : No thyromegaly.     Trachea: No tracheal  deviation.  Cardiovascular:     Rate and Rhythm: Normal rate and regular rhythm.     Heart sounds: Normal heart sounds. No murmur heard.    No friction rub. No gallop.  Pulmonary:     Breath sounds: Normal breath sounds. No wheezing or rales.  Abdominal:     General: Bowel sounds are normal. There is no distension.     Palpations: Abdomen is soft. There is no mass.     Tenderness: There is no abdominal tenderness.     Hernia: There is no hernia in the left inguinal area.  Genitourinary:    Penis: Normal.      Testes: Normal.  Musculoskeletal:        General: Normal range of motion.     Cervical back: Normal range of motion.  Lymphadenopathy:     Cervical: No cervical adenopathy.  Skin:    General: Skin is warm and dry.  Neurological:     Mental Status: He is alert and oriented to person, place, and time.    Physical Exam GENERAL: Alert,  cooperative, well developed, no acute distress HEENT: Normocephalic, normal oropharynx, moist mucous membranes CHEST: Clear to auscultation bilaterally, no wheezes, rhonchi, or crackles CARDIOVASCULAR: Normal heart rate and rhythm, S1 and S2 normal without murmurs ABDOMEN: Soft, non-tender, non-distended, without organomegaly, normal bowel sounds GENITOURINARY: Testicles normal position and consistency, penis normal EXTREMITIES: No cyanosis or edema NEUROLOGICAL: Cranial nerves grossly intact, moves all extremities without gross motor or sensory deficit   Assessment & Plan:  Well adult exam  Type 2 diabetes mellitus without complication, without long-term current use of insulin (HCC) -     Bayer DCA Hb A1c Waived -     Microalbumin / creatinine urine ratio  Dyslipidemia associated with type 2 diabetes mellitus (HCC) -     Comprehensive metabolic panel with GFR  Essential hypertension -     CBC with Differential/Platelet -     Lisinopril -hydroCHLOROthiazide ; Take 1 tablet by mouth daily.  Dispense: 90 tablet; Refill: 3  Pure  hypercholesterolemia -     Rosuvastatin  Calcium ; TAKE 1 TABLET BY MOUTH ON MONDAYS, WEDNESDAYS, AND FRIDAYS EVENINGS  Dispense: 39 tablet; Refill: 3  Encounter for immunization -     Flu vaccine trivalent PF, 6mos and older(Flulaval,Afluria,Fluarix,Fluzone)  Gastroesophageal reflux disease with esophagitis without hemorrhage  Arthritis  Other orders -     Pantoprazole  Sodium; Take 1 tablet (40 mg total) by mouth daily. For stomach  Dispense: 90 tablet; Refill: 0    Assessment and Plan Assessment & Plan Adult Wellness Visit   Routine adult wellness visit. Recent knee surgery and ongoing lower back pain were discussed. Reflux symptoms have improved with dietary changes. Awaiting biopsy results for a left temple skin lesion. A physical examination was performed, and blood work, including A1c, was ordered. Back stretch exercises were provided.  Type 2 diabetes mellitus   Diabetes is well-controlled with metformin . Morning blood sugar levels are consistently below 100 mg/dL, while evening levels vary with diet. A1c results are pending. Continue metformin  twice daily and monitor blood sugar levels, especially two hours postprandial. A1c results will be reviewed when available.  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 Stretching exercises are recommended but may initially worsen pain. Over-the-counter NSAIDs are used for pain management. Continue back stretch exercises and NSAIDs as needed.  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 Recent knee surgery with ongoing pain management. Left foot joint pain managed with cortisone injections, but symptoms persist. Prednisone  is avoided due to potential impact on diabetes. Continue current pain management strategies.  Plantar fasciitis  and Morton's neuroma, right foot   Plantar fasciitis and Morton's neuroma in the right foot have not improved with previous steroid injections and brace use. Surgical intervention is being considered.  Testicular pain, left   Recent left testicular pain due to positional discomfort during sleep has improved with ice and Tylenol . Advised on adjusting sleeping position to prevent recurrence. Continue using ice and Tylenol  for pain management.  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 Pain is managed by adjusting medication timing. No significant impact on sexual activity. Continue current medication regimen with adjusted timing.  Awaiting biopsy result, left temple skin lesion   Biopsy performed on a left temple skin lesion due to  potential malignancy concerns. Awaiting results from the dermatologist.  Encounter for immunization   Received and documented flu shot during the visit.       Follow-up: Return in about 6 months (around 08/11/2024) for diabetes, hypertension.  Butler Der, M.D.

## 2024-02-13 ENCOUNTER — Ambulatory Visit: Payer: Self-pay | Admitting: Family Medicine

## 2024-02-13 LAB — MICROALBUMIN / CREATININE URINE RATIO
Creatinine, Urine: 137.1 mg/dL
Microalb/Creat Ratio: 3 mg/g{creat} (ref 0–29)
Microalbumin, Urine: 3.6 ug/mL

## 2024-02-13 NOTE — Progress Notes (Signed)
Hello [name] [name],  Your lab result is normal and/or stable.Some minor variations that are not significant are commonly marked abnormal, but do not represent any medical problem for you.  Best regards, Shauntae Reitman, M.D.

## 2024-05-07 ENCOUNTER — Other Ambulatory Visit: Payer: Self-pay | Admitting: Family Medicine

## 2024-05-07 DIAGNOSIS — E119 Type 2 diabetes mellitus without complications: Secondary | ICD-10-CM

## 2024-05-08 ENCOUNTER — Encounter: Payer: Self-pay | Admitting: Orthopedic Surgery

## 2024-05-08 ENCOUNTER — Ambulatory Visit: Admitting: Orthopedic Surgery

## 2024-05-08 ENCOUNTER — Other Ambulatory Visit: Payer: Self-pay

## 2024-05-08 DIAGNOSIS — M25561 Pain in right knee: Secondary | ICD-10-CM | POA: Diagnosis not present

## 2024-05-08 NOTE — Progress Notes (Signed)
 "  Office Visit Note   Patient: [name] [name]           Date of Birth: 06-14-1965           MRN: [id number] Visit Date: 05/08/2024 Requested by: Zollie Lowers, MD [address] [place],  [place] [address] PCP: Zollie Lowers, MD  Subjective: Chief Complaint  Patient presents with   Right Knee - Pain    HPI: [name] [name] is a 59 y.o. male who presents to the office reporting episodic right knee pain.  Denies any recent injury.  Patient had arthroscopy and medial meniscus partial resection in May 2025.  That MRI scan is reviewed.  Patient did have a small meniscal tear on the medial side with a flap that was just under turned below the tibial plateau.  All in all looks like about one third at most of meniscal volume was involved in this tear.  Patient started to have some burning again on the medial aspect of the right knee.  Had been doing well for many months after his surgery.  Plans on potentially retiring soon.  Limps at times.  His vitamin D  is okay by his report.  Takes Aleve and Advil  and is also doing recumbent bike and elliptical to avoid loadbearing on the right knee.  Overall [name] [name] is a very fit person.  Denies much in the way of discrete mechanical symptoms.  He works at [employer] walking a lot on concrete floors..                ROS: All systems reviewed are negative as they relate to the chief complaint within the history of present illness.  Patient denies fevers or chills.  Assessment & Plan: Visit Diagnoses:  1. Right knee pain, unspecified chronicity     Plan: Impression is right knee pain with some medial sided pain without mechanical symptoms or effusion on exam today.  I think he is likely having some expected amount of postop pain without any definite structural problem suggested by either effusion or mechanical symptoms by history.  Decision was for against MRI scanning.  I think based on current situation MRI scan can be deferred.  He will call if his symptoms change  and we can discuss and potentially get set up for MRI scan at that point but even in a post surgical knee that MRI scan is often nondiagnostic.  More diagnostic for consideration of intervention which would either be injection or repeat arthroscopy would be mechanical symptoms and effusion.  Follow-up as needed.  Follow-Up Instructions: No follow-ups on file.   Orders:  Orders Placed This Encounter  Procedures   XR KNEE 3 VIEW RIGHT   No orders of the defined types were placed in this encounter.     Procedures: No procedures performed   Clinical Data: No additional findings.  Objective: Vital Signs: There were no vitals taken for this visit.  Physical Exam:  Constitutional: Patient appears well-developed HEENT:  Head: Normocephalic Eyes:EOM are normal Neck: Normal range of motion Cardiovascular: Normal rate Pulmonary/chest: Effort normal Neurologic: Patient is alert Skin: Skin is warm Psychiatric: Patient has normal mood and affect  Ortho Exam: Ortho exam demonstrates full range of motion of both knees.  Excellent quad and hamstring tone in both legs.  Full range of motion.  Negative patellar apprehension.  No groin pain with internal or external rotation of the leg.  No masses lymphadenopathy or skin changes noted in that right knee  region.  Not too much focal joint line tenderness today on that right-hand side medially or laterally.  Specialty Comments:  No specialty comments available.  Imaging: No results found.   PMFS History: Patient Active Problem List   Diagnosis Date Noted   Flank pain 02/16/2021   Pure hypercholesterolemia 09/18/2018   Right upper quadrant abdominal pain 09/11/2017   Type 2 diabetes mellitus (HCC) 07/01/2013   Dyslipidemia associated with type 2 diabetes mellitus (HCC) 07/01/2013   Vitamin D  deficiency 06/03/2013   Essential hypertension    RECTAL BLEEDING 01/22/2008   Past Medical History:  Diagnosis Date   Chronic pain     COVID-19 12/2018   Dental crown present    Diabetes mellitus without complication (HCC)    Diverticulosis    Hyperlipidemia    Hypertension    no meds   Internal hemorrhoids    Plantar fasciitis, right     Family History  Problem Relation Age of Onset   Heart disease Mother    Diabetes Mother    Stroke Mother    Kidney cancer Father    Colon cancer Neg Hx    Rectal cancer Neg Hx     Past Surgical History:  Procedure Laterality Date   CARPAL TUNNEL RELEASE Right 03/06/2014   Procedure: RIGHT CARPAL TUNNEL RELEASE;  Surgeon: Arley Curia, MD;  Location: [facility];  Service: Orthopedics;  Laterality: Right;   COLONOSCOPY     GANGLION CYST EXCISION  04/05/2007   rt wrist   GANGLION CYST EXCISION Left 01/17/2013   Procedure: EXCISION VOLAR MYXOID CYST LEFT WRIST;  Surgeon: Lamar LULLA Leonor Mickey., MD;  Location: [facility];  Service: Orthopedics;  Laterality: Left;   KNEE SURGERY Right    may 2025   Right Bicep surgery  04/04/2006   Radial nerve and cyst removal-bicep rupture   Right shoulder surgery  04/04/2008   SHOULDER ARTHROSCOPY  98,09   right   Social History   Occupational History   Not on file  Tobacco Use   Smoking status: Never   Smokeless tobacco: Never  Vaping Use   Vaping status: Never Used  Substance and Sexual Activity   Alcohol use: No   Drug use: No   Sexual activity: Not on file        "

## 2024-08-12 ENCOUNTER — Ambulatory Visit: Admitting: Family Medicine
